# Patient Record
Sex: Male | Born: 1960 | Race: Black or African American | Hispanic: No | Smoking: Never smoker
Health system: Southern US, Community
[De-identification: ages and names within clinical notes are randomized; demographics above are authoritative.]

## PROBLEM LIST (undated history)

## (undated) DIAGNOSIS — I10 Essential (primary) hypertension: Secondary | ICD-10-CM

## (undated) DIAGNOSIS — J302 Other seasonal allergic rhinitis: Secondary | ICD-10-CM

## (undated) DIAGNOSIS — T7840XA Allergy, unspecified, initial encounter: Secondary | ICD-10-CM

## (undated) DIAGNOSIS — E785 Hyperlipidemia, unspecified: Secondary | ICD-10-CM

## (undated) DIAGNOSIS — N189 Chronic kidney disease, unspecified: Secondary | ICD-10-CM

## (undated) DIAGNOSIS — E119 Type 2 diabetes mellitus without complications: Secondary | ICD-10-CM

## (undated) HISTORY — DX: Chronic kidney disease, unspecified: N18.9

## (undated) HISTORY — DX: Allergy, unspecified, initial encounter: T78.40XA

## (undated) HISTORY — PX: COLONOSCOPY: SHX174

## (undated) HISTORY — DX: Type 2 diabetes mellitus without complications: E11.9

## (undated) HISTORY — PX: NO PAST SURGERIES: SHX2092

## (undated) HISTORY — DX: Essential (primary) hypertension: I10

## (undated) HISTORY — DX: Hyperlipidemia, unspecified: E78.5

---

## 2005-12-21 ENCOUNTER — Emergency Department (HOSPITAL_COMMUNITY): Admission: EM | Admit: 2005-12-21 | Discharge: 2005-12-21 | Payer: Self-pay | Admitting: Emergency Medicine

## 2005-12-25 ENCOUNTER — Emergency Department (HOSPITAL_COMMUNITY): Admission: EM | Admit: 2005-12-25 | Discharge: 2005-12-25 | Payer: Self-pay | Admitting: Emergency Medicine

## 2008-02-16 ENCOUNTER — Emergency Department (HOSPITAL_COMMUNITY): Admission: EM | Admit: 2008-02-16 | Discharge: 2008-02-16 | Payer: Self-pay | Admitting: Family Medicine

## 2009-01-19 ENCOUNTER — Emergency Department (HOSPITAL_COMMUNITY): Admission: EM | Admit: 2009-01-19 | Discharge: 2009-01-19 | Payer: Self-pay | Admitting: Family Medicine

## 2009-02-07 ENCOUNTER — Emergency Department (HOSPITAL_COMMUNITY): Admission: EM | Admit: 2009-02-07 | Discharge: 2009-02-07 | Payer: Self-pay | Admitting: Emergency Medicine

## 2009-05-15 ENCOUNTER — Emergency Department (HOSPITAL_COMMUNITY): Admission: EM | Admit: 2009-05-15 | Discharge: 2009-05-15 | Payer: Self-pay | Admitting: Emergency Medicine

## 2010-07-11 ENCOUNTER — Emergency Department (HOSPITAL_COMMUNITY): Admission: EM | Admit: 2010-07-11 | Discharge: 2010-07-11 | Payer: Self-pay | Admitting: Family Medicine

## 2011-02-14 LAB — GLUCOSE, CAPILLARY: Glucose-Capillary: 102 mg/dL — ABNORMAL HIGH (ref 70–99)

## 2011-07-30 LAB — CULTURE, ROUTINE-ABSCESS

## 2014-05-12 ENCOUNTER — Emergency Department (HOSPITAL_COMMUNITY)
Admission: EM | Admit: 2014-05-12 | Discharge: 2014-05-12 | Disposition: A | Discharge status: Home / Self Care | Payer: Self-pay | Attending: Emergency Medicine | Admitting: Emergency Medicine

## 2014-05-12 ENCOUNTER — Encounter (HOSPITAL_COMMUNITY): Payer: Self-pay | Admitting: Emergency Medicine

## 2014-05-12 DIAGNOSIS — Z23 Encounter for immunization: Secondary | ICD-10-CM | POA: Insufficient documentation

## 2014-05-12 DIAGNOSIS — X131XXA Other contact with steam and other hot vapors, initial encounter: Secondary | ICD-10-CM

## 2014-05-12 DIAGNOSIS — Y92009 Unspecified place in unspecified non-institutional (private) residence as the place of occurrence of the external cause: Secondary | ICD-10-CM | POA: Insufficient documentation

## 2014-05-12 DIAGNOSIS — T24291A Burn of second degree of multiple sites of right lower limb, except ankle and foot, initial encounter: Secondary | ICD-10-CM

## 2014-05-12 DIAGNOSIS — T24299A Burn of second degree of multiple sites of unspecified lower limb, except ankle and foot, initial encounter: Secondary | ICD-10-CM | POA: Insufficient documentation

## 2014-05-12 DIAGNOSIS — X12XXXA Contact with other hot fluids, initial encounter: Secondary | ICD-10-CM | POA: Insufficient documentation

## 2014-05-12 DIAGNOSIS — Y9389 Activity, other specified: Secondary | ICD-10-CM | POA: Insufficient documentation

## 2014-05-12 MED ORDER — TETANUS-DIPHTH-ACELL PERTUSSIS 5-2.5-18.5 LF-MCG/0.5 IM SUSP
0.5000 mL | Freq: Once | INTRAMUSCULAR | Status: AC
  Administered 2014-05-12: 0.5 mL via INTRAMUSCULAR
  Filled 2014-05-12: qty 0.5

## 2014-05-12 MED ORDER — CEPHALEXIN 500 MG PO CAPS: 500.0000 mg | ORAL_CAPSULE | Freq: Four times a day (QID) | ORAL | Status: DC

## 2014-05-12 MED ORDER — SILVER SULFADIAZINE 1 % EX CREA
TOPICAL_CREAM | Freq: Every day | CUTANEOUS | Status: DC
  Administered 2014-05-12: 09:00:00 via TOPICAL
  Filled 2014-05-12: qty 85

## 2014-05-12 MED ORDER — OXYCODONE-ACETAMINOPHEN 5-325 MG PO TABS: 2.0000 | ORAL_TABLET | ORAL | Status: DC | PRN
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
## 2014-05-12 NOTE — Discharge Instructions (Signed)
Apply cream to affected area twice daily.  Follow instruction below.  Take antibiotic as prescribed for the full duration.  Take pain medication as needed.    Burn Care Your skin is a natural barrier to infection. It is the largest organ of your body. Burns damage this natural protection. To help prevent infection, it is very important to follow your caregiver's instructions in the care of your burn. Burns are classified as:  First degree. There is only redness of the skin (erythema). No scarring is expected.  Second degree. There is blistering of the skin. Scarring may occur with deeper burns.  Third degree. All layers of the skin are injured, and scarring is expected. HOME CARE INSTRUCTIONS   Wash your hands well before changing your bandage.  Change your bandage as often as directed by your caregiver.  Remove the old bandage. If the bandage sticks, you may soak it off with cool, clean water.  Cleanse the burn thoroughly but gently with mild soap and water.  Pat the area dry with a clean, dry cloth.  Apply a thin layer of antibacterial cream to the burn.  Apply a clean bandage as instructed by your caregiver.  Keep the bandage as clean and dry as possible.  Elevate the affected area for the first 24 hours, then as instructed by your caregiver.  Only take over-the-counter or prescription medicines for pain, discomfort, or fever as directed by your caregiver. SEEK IMMEDIATE MEDICAL CARE IF:   You develop excessive pain.  You develop redness, tenderness, swelling, or red streaks near the burn.  The burned area develops yellowish-white fluid (pus) or a bad smell.  You have a fever. MAKE SURE YOU:   Understand these instructions.  Will watch your condition.  Will get help right away if you are not doing well or get worse. Document Released: 10/21/2005 Document Revised: 01/13/2012 Document Reviewed: 03/13/2011 [facility] Patient Information 2015 [place], [place]. This  information is not intended to replace advice given to you by your health care provider. Make sure you discuss any questions you have with your health care provider.

## 2014-05-12 NOTE — ED Provider Notes (Signed)
Medical screening examination/treatment/procedure(s) were performed by non-physician practitioner and as supervising physician I was immediately available for consultation/collaboration.   EKG Interpretation None        Blanchard Kelch, MD 05/12/14 2019

## 2014-05-12 NOTE — ED Notes (Signed)
PA at bedside to see pt.
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
## 2014-05-12 NOTE — ED Notes (Signed)
Brought pt back via wheelchair; pt undressed and in gown; Janett Billow, RN present in room

## 2014-05-13 NOTE — Discharge Planning (Signed)
[facility] Liaison was not able to see patient, [facility] orange card information and resource guide will be sent to the address provided.

## 2015-06-19 ENCOUNTER — Encounter (HOSPITAL_COMMUNITY): Payer: Self-pay | Admitting: *Deleted

## 2015-06-19 ENCOUNTER — Emergency Department (HOSPITAL_COMMUNITY)
Admission: EM | Admit: 2015-06-19 | Discharge: 2015-06-20 | Disposition: A | Discharge status: Home / Self Care | Payer: No Typology Code available for payment source | Attending: Emergency Medicine | Admitting: Emergency Medicine

## 2015-06-19 ENCOUNTER — Emergency Department (HOSPITAL_COMMUNITY): Payer: No Typology Code available for payment source

## 2015-06-19 ENCOUNTER — Emergency Department (INDEPENDENT_AMBULATORY_CARE_PROVIDER_SITE_OTHER)
Admission: EM | Admit: 2015-06-19 | Discharge: 2015-06-19 | Disposition: A | Discharge status: Other Acute Inpatient Hospital | Payer: No Typology Code available for payment source | Source: Home / Self Care | Attending: Family Medicine | Admitting: Family Medicine

## 2015-06-19 ENCOUNTER — Encounter (HOSPITAL_COMMUNITY): Payer: Self-pay | Admitting: Emergency Medicine

## 2015-06-19 DIAGNOSIS — N181 Chronic kidney disease, stage 1: Secondary | ICD-10-CM | POA: Diagnosis not present

## 2015-06-19 DIAGNOSIS — N289 Disorder of kidney and ureter, unspecified: Secondary | ICD-10-CM | POA: Diagnosis not present

## 2015-06-19 DIAGNOSIS — R42 Dizziness and giddiness: Secondary | ICD-10-CM | POA: Insufficient documentation

## 2015-06-19 DIAGNOSIS — Z79899 Other long term (current) drug therapy: Secondary | ICD-10-CM | POA: Insufficient documentation

## 2015-06-19 DIAGNOSIS — I129 Hypertensive chronic kidney disease with stage 1 through stage 4 chronic kidney disease, or unspecified chronic kidney disease: Secondary | ICD-10-CM | POA: Diagnosis not present

## 2015-06-19 DIAGNOSIS — I10 Essential (primary) hypertension: Secondary | ICD-10-CM | POA: Diagnosis not present

## 2015-06-19 HISTORY — DX: Other seasonal allergic rhinitis: J30.2

## 2015-06-19 LAB — COMPREHENSIVE METABOLIC PANEL
ALK PHOS: 56 U/L (ref 38–126)
ALT: 16 U/L — AB (ref 17–63)
AST: 21 U/L (ref 15–41)
Albumin: 4.2 g/dL (ref 3.5–5.0)
Anion gap: 10 (ref 5–15)
BILIRUBIN TOTAL: 0.7 mg/dL (ref 0.3–1.2)
BUN: 16 mg/dL (ref 6–20)
CALCIUM: 9.5 mg/dL (ref 8.9–10.3)
CHLORIDE: 103 mmol/L (ref 101–111)
CO2: 26 mmol/L (ref 22–32)
CREATININE: 1.71 mg/dL — AB (ref 0.61–1.24)
GFR, EST AFRICAN AMERICAN: 51 mL/min — AB (ref >60)
GFR, EST NON AFRICAN AMERICAN: 44 mL/min — AB (ref >60)
Glucose, Bld: 121 mg/dL — ABNORMAL HIGH (ref 65–99)
Potassium: 3.8 mmol/L (ref 3.5–5.1)
Sodium: 139 mmol/L (ref 135–145)
TOTAL PROTEIN: 7 g/dL (ref 6.5–8.1)

## 2015-06-19 LAB — POCT I-STAT, CHEM 8
BUN: 23 mg/dL — AB (ref 6–20)
CALCIUM ION: 1.2 mmol/L (ref 1.12–1.23)
CREATININE: 1.9 mg/dL — AB (ref 0.61–1.24)
Chloride: 104 mmol/L (ref 101–111)
GLUCOSE: 102 mg/dL — AB (ref 65–99)
HEMATOCRIT: 47 % (ref 39.0–52.0)
HEMOGLOBIN: 16 g/dL (ref 13.0–17.0)
Potassium: 4.1 mmol/L (ref 3.5–5.1)
Sodium: 140 mmol/L (ref 135–145)
TCO2: 23 mmol/L (ref 0–100)

## 2015-06-19 LAB — CBG MONITORING, ED: GLUCOSE-CAPILLARY: 104 mg/dL — AB (ref 65–99)

## 2015-06-19 LAB — I-STAT TROPONIN, ED: Troponin i, poc: 0 ng/mL (ref 0.00–0.08)

## 2015-06-19 LAB — BRAIN NATRIURETIC PEPTIDE: B NATRIURETIC PEPTIDE 5: 35.1 pg/mL (ref 0.0–100.0)

## 2015-06-19 MED ORDER — LISINOPRIL 10 MG PO TABS: 10.0000 mg | ORAL_TABLET | Freq: Every day | ORAL | Status: DC

## 2015-06-19 MED ORDER — HYDROCHLOROTHIAZIDE 25 MG PO TABS: 25.0000 mg | ORAL_TABLET | Freq: Every day | ORAL | Status: DC

## 2015-06-19 MED ORDER — HYDRALAZINE HCL 20 MG/ML IJ SOLN
10.0000 mg | Freq: Once | INTRAMUSCULAR | Status: AC
  Administered 2015-06-19: 10 mg via INTRAVENOUS
  Filled 2015-06-19: qty 1

## 2015-06-19 NOTE — ED Provider Notes (Signed)
CSN: [id number]     Arrival date & time 06/19/15  1813 History   First MD Initiated Contact with Patient 06/19/15 2005     Chief Complaint  Patient presents with  . Hypertension     (Consider location/radiation/quality/duration/timing/severity/associated sxs/prior Treatment) Patient is a 54 y.o. male presenting with hypertension. The history is provided by the patient. No language interpreter was used.  Hypertension This is a new problem. The current episode started today. Pertinent negatives include no chest pain, chills, fever, nausea or vomiting. Associated symptoms comments: Patient without significant medical history presents following Urgent Care evaluation where he was found to be hypertensive with elevated creatinine.  He denies known history of elevated blood pressure. He became lightheaded today and his blood pressure was checked on two different machines and found to be high. No chest pain, SOB, extremity edema, nausea or diaphoresis. He has an unknown family history as his parents died at a young age. He is currently asymptomatic.Marland Kitchen    Past Medical History  Diagnosis Date  . Seasonal allergies    History reviewed. No pertinent past surgical history. History reviewed. No pertinent family history. Social History  Substance Use Topics  . Smoking status: Never Smoker   . Smokeless tobacco: None  . Alcohol Use: 0.6 oz/week    1 Cans of beer per week    Review of Systems  Constitutional: Negative for fever and chills.  HENT: Negative.   Eyes: Negative for visual disturbance.  Respiratory: Negative.  Negative for shortness of breath.   Cardiovascular: Negative.  Negative for chest pain.  Gastrointestinal: Negative.  Negative for nausea and vomiting.  Genitourinary: Negative.   Musculoskeletal: Negative.   Skin: Negative.   Neurological: Positive for light-headedness. Negative for syncope.      Allergies  Review of patient's allergies indicates no known  allergies.  Home Medications   Prior to Admission medications   Medication Sig Start Date End Date Taking? Authorizing Provider  Multiple Vitamin (MULTIVITAMIN WITH MINERALS) TABS tablet Take 1 tablet by mouth daily.   Yes Historical Provider, MD   BP 166/106 mmHg  Pulse 55  Temp(Src) 98 F (36.7 C) (Oral)  Resp 16  Wt 182 lb 3.2 oz (82.645 kg)  SpO2 100% Physical Exam  Constitutional: He is oriented to person, place, and time. He appears well-developed and well-nourished. No distress.  HENT:  Head: Normocephalic.  Eyes: Conjunctivae are normal.  Neck: Normal range of motion. Neck supple.  Cardiovascular: Normal rate and regular rhythm.   No carotid bruit.  Pulmonary/Chest: Effort normal and breath sounds normal.  Abdominal: Soft. Bowel sounds are normal. There is no tenderness. There is no rebound and no guarding.  Musculoskeletal: Normal range of motion. He exhibits no edema.  Neurological: He is alert and oriented to person, place, and time. Coordination normal.  Skin: Skin is warm and dry. No rash noted.  Psychiatric: He has a normal mood and affect.    ED Course  Procedures (including critical care time) Labs Review Labs Reviewed  COMPREHENSIVE METABOLIC PANEL  BRAIN NATRIURETIC PEPTIDE  I-STAT [place], ED  CBG MONITORING, ED   Results for orders placed or performed during the hospital encounter of 06/19/15  Comprehensive metabolic panel  Result Value Ref Range   Sodium 139 135 - 145 mmol/L   Potassium 3.8 3.5 - 5.1 mmol/L   Chloride 103 101 - 111 mmol/L   CO2 26 22 - 32 mmol/L   Glucose, Bld 121 (H) 65 - 99 mg/dL  BUN 16 6 - 20 mg/dL   Creatinine, Ser 1.71 (H) 0.61 - 1.24 mg/dL   Calcium 9.5 8.9 - 10.3 mg/dL   Total Protein 7.0 6.5 - 8.1 g/dL   Albumin 4.2 3.5 - 5.0 g/dL   AST 21 15 - 41 U/L   ALT 16 (L) 17 - 63 U/L   Alkaline Phosphatase 56 38 - 126 U/L   Total Bilirubin 0.7 0.3 - 1.2 mg/dL   GFR calc non Af Amer 44 (L) >60 mL/min   GFR calc Af Amer  51 (L) >60 mL/min   Anion gap 10 5 - 15  Brain natriuretic peptide  Result Value Ref Range   B Natriuretic Peptide 35.1 0.0 - 100.0 pg/mL  I-stat troponin, ED  Result Value Ref Range   Troponin i, poc 0.00 0.00 - 0.08 ng/mL   Comment 3          CBG monitoring, ED  Result Value Ref Range   Glucose-Capillary 104 (H) 65 - 99 mg/dL   Dg Chest 2 View  06/19/2015   CLINICAL DATA:  Dizziness, hypertension  EXAM: CHEST  2 VIEW  COMPARISON:  None.  FINDINGS: Lungs are clear.  No pleural effusion or pneumothorax.  The heart is normal in size.  Visualized osseous structures are within normal limits.  IMPRESSION: No evidence of acute cardiopulmonary disease.   Electronically Signed   By: Julian Hy M.D.   On: 06/19/2015 21:40   US Renal  06/19/2015   CLINICAL DATA:  Hypertension and elevated creatinine.  EXAM: RENAL / URINARY TRACT ULTRASOUND COMPLETE  COMPARISON:  None.  FINDINGS: Right Kidney:  Length: 10 cm. Echogenicity within normal limits. No mass or hydronephrosis visualized.  Left Kidney:  Length: 10 cm. Echogenicity within normal limits. No mass or hydronephrosis visualized.  Bladder:  Appears normal for degree of bladder distention.  IMPRESSION: Normal renal ultrasound.   Electronically Signed   By: Monte Fantasia M.D.   On: 06/19/2015 23:00    Imaging Review No results found. I, Raneen Jaffer A, personally reviewed and evaluated these images and lab results as part of my medical decision-making.   EKG Interpretation None      MDM   Final diagnoses:  None    1. Hypertension 2. Renal insufficiency  The patient's labs from visit to Urgent Care reviewed in the chart. Cr 1.9. Will repeat labs, treat blood pressure, obtain renal ultrasound and reassess to determine admission vs discharge home. Of note, the patient does not want to be admitted if possible to discharge home.  Hydralazine with little improvement. He continues to be asymptomatic. Renal US normal. EKG with no  LVH. He can be discharged home with Lisinopril and HCTZ. Strongly encouraged establishing with PCP for further management of HTN and renal insufficiency. Family association with Dr. Kevan Ny who they will contact tomorrow. Will provide resource list as well.      Charlann Lange, PA-C 06/19/15 2346  Charlesetta Shanks, MD 06/21/15 [phone]

## 2015-06-19 NOTE — Discharge Instructions (Signed)

## 2015-06-19 NOTE — ED Provider Notes (Addendum)
CSN: [id number]     Arrival date & time 06/19/15  1418 History   First MD Initiated Contact with Patient 06/19/15 1615     Chief Complaint  Patient presents with  . Dizziness   (Consider location/radiation/quality/duration/timing/severity/associated sxs/prior Treatment) Patient is a 54 y.o. male presenting with dizziness. The history is provided by the patient.  Dizziness Quality:  Lightheadedness Severity:  Mild Onset quality:  Sudden Duration:  1 day Progression:  Unchanged Chronicity:  New Relieved by:  None tried Worsened by:  Nothing Ineffective treatments:  None tried Associated symptoms: no chest pain, no headaches, no palpitations, no shortness of breath and no weakness     Past Medical History  Diagnosis Date  . Seasonal allergies    History reviewed. No pertinent past surgical history. History reviewed. No pertinent family history. Social History  Substance Use Topics  . Smoking status: Never Smoker   . Smokeless tobacco: None  . Alcohol Use: 0.6 oz/week    1 Cans of beer per week    Review of Systems  Unable to perform ROS Constitutional: Negative.   HENT: Negative.   Respiratory: Negative for shortness of breath.   Cardiovascular: Negative for chest pain, palpitations and leg swelling.  Neurological: Positive for dizziness and light-headedness. Negative for weakness and headaches.    Allergies  Review of patient's allergies indicates no known allergies.  Home Medications   Prior to Admission medications   Medication Sig Start Date End Date Taking? Authorizing Provider  hydrochlorothiazide (HYDRODIURIL) 25 MG tablet Take 1 tablet (25 mg total) by mouth daily. 06/19/15   Charlann Lange, PA-C  lisinopril (PRINIVIL,ZESTRIL) 10 MG tablet Take 1 tablet (10 mg total) by mouth daily. 06/19/15   Charlann Lange, PA-C  Multiple Vitamin (MULTIVITAMIN WITH MINERALS) TABS tablet Take 1 tablet by mouth daily.    Historical Provider, MD   BP 204/111 mmHg  Pulse 59   Temp(Src) 99.2 F (37.3 C) (Oral)  Resp 18  SpO2 100% Physical Exam  Constitutional: He is oriented to person, place, and time. He appears well-developed and well-nourished. No distress.  HENT:  Right Ear: External ear normal.  Left Ear: External ear normal.  Mouth/Throat: Oropharynx is clear and moist.  Eyes: Conjunctivae and EOM are normal. Pupils are equal, round, and reactive to light.  Neck: Normal range of motion. Neck supple.  Cardiovascular: Regular rhythm and normal heart sounds.   Pulmonary/Chest: Effort normal and breath sounds normal.  Abdominal: Soft. Bowel sounds are normal.  Neurological: He is alert and oriented to person, place, and time.  Skin: Skin is warm and dry.  Nursing note and vitals reviewed.   ED Course  Procedures (including critical care time) Labs Review Labs Reviewed  POCT I-STAT, CHEM 8 - Abnormal; Notable for the following:    BUN 23 (*)    Creatinine, Ser 1.90 (*)    Glucose, Bld 102 (*)    All other components within normal limits    Imaging Review Dg Chest 2 View  06/19/2015   CLINICAL DATA:  Dizziness, hypertension  EXAM: CHEST  2 VIEW  COMPARISON:  None.  FINDINGS: Lungs are clear.  No pleural effusion or pneumothorax.  The heart is normal in size.  Visualized osseous structures are within normal limits.  IMPRESSION: No evidence of acute cardiopulmonary disease.   Electronically Signed   By: Julian Hy M.D.   On: 06/19/2015 21:40   US Renal  06/19/2015   CLINICAL DATA:  Hypertension and elevated creatinine.  EXAM:  RENAL / URINARY TRACT ULTRASOUND COMPLETE  COMPARISON:  None.  FINDINGS: Right Kidney:  Length: 10 cm. Echogenicity within normal limits. No mass or hydronephrosis visualized.  Left Kidney:  Length: 10 cm. Echogenicity within normal limits. No mass or hydronephrosis visualized.  Bladder:  Appears normal for degree of bladder distention.  IMPRESSION: Normal renal ultrasound.   Electronically Signed   By: Monte Fantasia M.D.    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    Billy Fischer, MD 06/19/15 1719  Billy Fischer, MD 06/21/15 [phone]

## 2015-06-19 NOTE — ED Notes (Signed)
Pt   Reports      Symptoms  Of  dizzyness       And  Headache       For     About  6  Months  -  Pt  denys  Being a  Diabetic  Or  Hypertensive         denys taking  Any meds

## 2015-06-19 NOTE — ED Notes (Signed)
Pt went to urgent care and they diagnosed him with malignant hypertension and they sent him to ED. He states he was at work this morning and he started feeling light headed and dizzy. He said the Urgent Care told him he needed to be treated in the ER. His BP was 204/111 in Urgent Care.

## 2020-01-17 ENCOUNTER — Other Ambulatory Visit: Payer: Self-pay

## 2020-01-17 ENCOUNTER — Emergency Department (HOSPITAL_COMMUNITY)
Admission: EM | Admit: 2020-01-17 | Discharge: 2020-01-17 | Disposition: A | Discharge status: Home / Self Care | Payer: 59 | Attending: Emergency Medicine | Admitting: Emergency Medicine

## 2020-01-17 ENCOUNTER — Encounter (HOSPITAL_COMMUNITY): Payer: Self-pay

## 2020-01-17 DIAGNOSIS — R42 Dizziness and giddiness: Secondary | ICD-10-CM | POA: Diagnosis present

## 2020-01-17 DIAGNOSIS — I10 Essential (primary) hypertension: Secondary | ICD-10-CM | POA: Insufficient documentation

## 2020-01-17 LAB — CBC
HCT: 44.1 % (ref 39.0–52.0)
Hemoglobin: 13.9 g/dL (ref 13.0–17.0)
MCH: 27.7 pg (ref 26.0–34.0)
MCHC: 31.5 g/dL (ref 30.0–36.0)
MCV: 87.8 fL (ref 80.0–100.0)
Platelets: 178 10*3/uL (ref 150–400)
RBC: 5.02 MIL/uL (ref 4.22–5.81)
RDW: 14.3 % (ref 11.5–15.5)
WBC: 6.4 10*3/uL (ref 4.0–10.5)
nRBC: 0 % (ref 0.0–0.2)

## 2020-01-17 LAB — BASIC METABOLIC PANEL
Anion gap: 9 (ref 5–15)
BUN: 20 mg/dL (ref 6–20)
CO2: 26 mmol/L (ref 22–32)
Calcium: 8.9 mg/dL (ref 8.9–10.3)
Chloride: 102 mmol/L (ref 98–111)
Creatinine, Ser: 1.71 mg/dL — ABNORMAL HIGH (ref 0.61–1.24)
GFR calc Af Amer: 50 mL/min — ABNORMAL LOW (ref >60)
GFR calc non Af Amer: 43 mL/min — ABNORMAL LOW (ref >60)
Glucose, Bld: 148 mg/dL — ABNORMAL HIGH (ref 70–99)
Potassium: 3.7 mmol/L (ref 3.5–5.1)
Sodium: 137 mmol/L (ref 135–145)

## 2020-01-17 MED ORDER — HYDROCHLOROTHIAZIDE 25 MG PO TABS: 25.0000 mg | ORAL_TABLET | Freq: Every day | ORAL | 0 refills | Status: DC

## 2020-01-17 MED ORDER — HYDROCHLOROTHIAZIDE 25 MG PO TABS
25.0000 mg | ORAL_TABLET | Freq: Every day | ORAL | Status: DC
  Administered 2020-01-17: 14:00:00 25 mg via ORAL
  Filled 2020-01-17: qty 1

## 2020-01-17 NOTE — Discharge Instructions (Signed)
Please contact your insurance company and figure out which providers in the area accept your insurance.  It is imperative they get established with a primary care provider soon as possible for ongoing evaluation and management of your high blood pressure.  Please take your medications, as prescribed.  Return to the ED or seek immediate medical attention should you experience any new or worsening symptoms.

## 2020-01-17 NOTE — ED Triage Notes (Signed)
Pt states while at work he had a sudden episode of dizziness and blurred vision, pts coworker checked his BP which was 191/113 and 189/109. Pt does not take any medications of HTN. Pt denies any complaints at this time. Pt ambulatory with steady gait.

## 2020-01-17 NOTE — ED Provider Notes (Signed)
[facility] Provider Note   CSN: [id number] Arrival date & time: 01/17/20  1201     History Chief Complaint  Patient presents with  . Dizziness  . Hypertension    Timothy Warren is a 59 y.o. male with PMH significant for HTN presents the ED with an episode of dizziness, blurred vision, and lightheadedness while at work.  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  He had just obtained a screwdriver from a coworker when he suddenly felt like his vision was changing, that he felt like he was floating, and that he may pass out.  He denied any associated chest pain, back pain, or shortness of breath with this episode of dizziness.  It spontaneously resolved, but he was prompted to come to the ED for evaluation.  On my examination, he is denying any and all complaints.  He denies feeling any blurred vision, lightheadedness, headache, room spinning dizziness, chest pain, back pain, shortness of breath, or other symptoms.  I reviewed patient's medical record and he had been evaluated for similar complaints in 2016 and discharged home with HCTZ and lisinopril with instructions to get established with a primary care provider.  He reports that he tried, but gave up shortly thereafter.  He denies any amphetamine, tobacco, coffee, or energy drinks.  He does endorse occasional alcohol and marijuana use.  HPI     Past Medical History:  Diagnosis Date  . Seasonal allergies     There are no problems to display for this patient.   History reviewed. No pertinent surgical history.     No family history on file.  Social History   Tobacco Use  . Smoking status: Never Smoker  Substance Use Topics  . Alcohol use: Yes    Alcohol/week: 1.0 standard drinks    Types: 1 Cans of beer per week  . Drug use: Yes    Types: Marijuana    Home Medications Prior to Admission medications   Medication Sig Start Date End  Date Taking? Authorizing Provider  Boswellia-Glucosamine-Vit D (OSTEO BI-FLEX ONE PER DAY PO) Take 1 tablet by mouth daily.   Yes [provider]  Multiple Vitamin (MULTIVITAMIN WITH MINERALS) TABS tablet Take 1 tablet by mouth daily.   Yes [provider]  hydrochlorothiazide (HYDRODIURIL) 25 MG tablet Take 1 tablet (25 mg total) by mouth daily. 01/17/20 02/16/20  Corena Herter, PA-C    Allergies    Patient has no known allergies.  Review of Systems   Review of Systems  All other systems reviewed and are negative.   Physical Exam Updated Vital Signs BP (!) 181/101   Pulse (!) 55   Temp (!) 97.4 F (36.3 C) (Oral)   Resp 18   Ht 5\' 9"  (1.753 m)   Wt 87.2 kg   SpO2 99%   BMI 28.40 kg/m   Physical Exam Vitals and nursing note reviewed. Exam conducted with a chaperone present.  Constitutional:      General: He is not in acute distress.    Appearance: Normal appearance. He is not ill-appearing.  HENT:     Head: Normocephalic and atraumatic.  Eyes:     General: No scleral icterus.    Extraocular Movements: Extraocular movements intact.     Conjunctiva/sclera: Conjunctivae normal.     Pupils: Pupils are equal, round, and reactive to light.  Cardiovascular:     Rate and Rhythm: Normal rate and  regular rhythm.     Pulses: Normal pulses.     Heart sounds: Normal heart sounds.  Pulmonary:     Effort: Pulmonary effort is normal. No respiratory distress.     Breath sounds: Normal breath sounds. No wheezing or rales.  Musculoskeletal:     Cervical back: Normal range of motion and neck supple. No rigidity.  Skin:    General: Skin is dry.     Capillary Refill: Capillary refill takes less than 2 seconds.  Neurological:     Mental Status: He is alert.     GCS: GCS eye subscore is 4. GCS verbal subscore is 5. GCS motor subscore is 6.     Comments: CN II through XII grossly intact.  ROM and strength intact throughout.  Negative Romberg and cerebellar exams.   PERRL and EOM intact.  Psychiatric:        Mood and Affect: Mood normal.        Behavior: Behavior normal.        Thought Content: Thought content normal.      ED Results / Procedures / Treatments   Labs (all labs ordered are listed, but only abnormal results are displayed) Labs Reviewed  BASIC METABOLIC PANEL - Abnormal; Notable for the following components:      Result Value   Glucose, Bld 148 (*)    Creatinine, Ser 1.71 (*)    GFR calc non Af Amer 43 (*)    GFR calc Af Amer 50 (*)    All other components within normal limits  CBC    EKG EKG Interpretation  Date/Time:  Monday January 17 2020 12:27:01 EDT Ventricular Rate:  60 PR Interval:  142 QRS Duration: 100 QT Interval:  428 QTC Calculation: 428 R Axis:   -35 Text Interpretation: Normal sinus rhythm Left axis deviation Incomplete right bundle branch block Nonspecific ST and T wave abnormality Abnormal ECG no significant change since 2016 Confirmed by Sherwood Gambler [phone]) on 01/17/2020 12:41:27 PM   Radiology No results found.  Procedures Procedures (including critical care time)  Medications Ordered in ED Medications  hydrochlorothiazide (HYDRODIURIL) tablet 25 mg (25 mg Oral Given 01/17/20 1414)    ED Course  I have reviewed the triage vital signs and the nursing notes.  Pertinent labs & imaging results that were available during my care of the patient were reviewed by me and considered in my medical decision making (see chart for details).    MDM Rules/Calculators/A&P                      EKG is reviewed and demonstrates no significant deviation from prior tracing.  His BMP demonstrates a mildly impaired renal function, consistent with his last ED presentation in 2016 for symptomatic hypertension.  Provided patient with HCTZ while here in the ED which brought his BP down from [id number] systolic to a more reasonable figure.  He is feeling entirely improved and prepared for discharge.  Will prescribe short  course of HCTZ and have patient get established with a primary care provider soon as possible for ongoing evaluation management of his high blood pressure.  Strict ED return precautions discussed with the patient.  All of the evaluation and work-up results were discussed with the patient and any family at bedside. They were provided opportunity to ask any additional questions and have none at this time. They have expressed understanding of verbal discharge instructions as well as return precautions and are agreeable to the plan.  Final Clinical Impression(s) / ED Diagnoses Final diagnoses:  Essential hypertension    Rx / DC Orders ED Discharge Orders         Ordered    hydrochlorothiazide (HYDRODIURIL) 25 MG tablet  Daily     01/17/20 1442           Corena Herter, PA-C 01/17/20 1442    Sherwood Gambler, MD 01/18/20 [phone]

## 2020-02-16 ENCOUNTER — Ambulatory Visit: Payer: 59

## 2020-02-17 ENCOUNTER — Ambulatory Visit: Payer: 59 | Attending: Family Medicine | Admitting: Physician Assistant

## 2020-02-17 ENCOUNTER — Other Ambulatory Visit: Payer: Self-pay

## 2020-02-17 DIAGNOSIS — Z09 Encounter for follow-up examination after completed treatment for conditions other than malignant neoplasm: Secondary | ICD-10-CM | POA: Diagnosis not present

## 2020-02-17 DIAGNOSIS — I10 Essential (primary) hypertension: Secondary | ICD-10-CM | POA: Diagnosis not present

## 2020-02-17 DIAGNOSIS — R739 Hyperglycemia, unspecified: Secondary | ICD-10-CM | POA: Diagnosis not present

## 2020-02-17 MED ORDER — HYDROCHLOROTHIAZIDE 25 MG PO TABS: 25.0000 mg | ORAL_TABLET | Freq: Every day | ORAL | 1 refills | Status: DC

## 2020-02-17 NOTE — Progress Notes (Signed)
Patient ID: Timothy Warren, male   DOB: 02/18/1961, 59 y.o.   MRN: [id number]   Virtual Visit via Telephone Note  I connected with [name] on 02/17/20 at  2:30 PM EDT by telephone and verified that I am speaking with the correct person using two identifiers.   I discussed the limitations, risks, security and privacy concerns of performing an evaluation and management service by telephone and the availability of in person appointments. I also discussed with the patient that there may be a patient responsible charge related to this service. The patient expressed understanding and agreed to proceed.   PATIENT visit by telephone virtually in the context of Covid-19 pandemic. Patient location:  home My Location:  [place] office Persons on the call:  Me and the patient    History of Present Illness: After being seen in the Ed for dizziness and htn.  He was restarted on HCTZ.  He feels that is bc he has been eating a lot of oodles of noodles and drinking 2 ensure daily as supplements.  He says he has been taking the HCTZ and having his BP checked at work and they have been telling him that it's "good."  However, he does not have any numbers.  Dizziness has resolved.  He feels good.  No CP/HA.     Observations/Objective:  NAD.  A&Ox3   Assessment and Plan: 1. Hypertension, unspecified type Check BP OOO 2-3 times weekly and record and bring to next visit.   - hydrochlorothiazide (HYDRODIURIL) 25 MG tablet; Take 1 tablet (25 mg total) by mouth daily.  Dispense: 30 tablet; Refill: 1  2. Hyperglycemia I have had a lengthy discussion and provided education about insulin resistance and the intake of too much sugar/refined carbohydrates.  I have advised the patient to work at a goal of eliminating sugary drinks, candy, desserts, sweets, refined sugars, processed foods, and white carbohydrates.  The patient expresses understanding.  Will check A1C at next visit   3. Encounter for examination  following treatment at hospital    Follow Up Instructions: Assign PCP and BP/A1C check    I discussed the assessment and treatment plan with the patient. The patient was provided an opportunity to ask questions and all were answered. The patient agreed with the plan and demonstrated an understanding of the instructions.   The patient was advised to call back or seek an in-person evaluation if the symptoms worsen or if the condition fails to improve as anticipated.  I provided 14 minutes of non-face-to-face time during this encounter.   Freeman Caldron, PA-C

## 2020-02-18 ENCOUNTER — Other Ambulatory Visit: Payer: Self-pay

## 2020-02-18 DIAGNOSIS — I10 Essential (primary) hypertension: Secondary | ICD-10-CM

## 2020-02-18 MED ORDER — HYDROCHLOROTHIAZIDE 25 MG PO TABS: 25.0000 mg | ORAL_TABLET | Freq: Every day | ORAL | 1 refills | Status: DC

## 2020-03-22 ENCOUNTER — Ambulatory Visit: Payer: 59 | Attending: Family Medicine | Admitting: Family Medicine

## 2020-03-22 ENCOUNTER — Other Ambulatory Visit: Payer: Self-pay

## 2020-03-22 ENCOUNTER — Encounter: Payer: Self-pay | Admitting: Family Medicine

## 2020-03-22 VITALS — BP 151/93 | HR 83 | Temp 98.6°F | Ht 69.0 in | Wt 182.2 lb

## 2020-03-22 DIAGNOSIS — R7309 Other abnormal glucose: Secondary | ICD-10-CM

## 2020-03-22 DIAGNOSIS — E119 Type 2 diabetes mellitus without complications: Secondary | ICD-10-CM

## 2020-03-22 DIAGNOSIS — I10 Essential (primary) hypertension: Secondary | ICD-10-CM

## 2020-03-22 DIAGNOSIS — R739 Hyperglycemia, unspecified: Secondary | ICD-10-CM

## 2020-03-22 DIAGNOSIS — R350 Frequency of micturition: Secondary | ICD-10-CM | POA: Diagnosis not present

## 2020-03-22 DIAGNOSIS — N189 Chronic kidney disease, unspecified: Secondary | ICD-10-CM

## 2020-03-22 LAB — POCT URINALYSIS DIP (CLINITEK)
Bilirubin, UA: NEGATIVE
Glucose, UA: NEGATIVE mg/dL
Ketones, POC UA: NEGATIVE mg/dL
Leukocytes, UA: NEGATIVE
Nitrite, UA: NEGATIVE
POC PROTEIN,UA: NEGATIVE
Spec Grav, UA: >=1.03 — AB
Urobilinogen, UA: 0.2 U/dL
pH, UA: 5.5

## 2020-03-22 LAB — POCT GLYCOSYLATED HEMOGLOBIN (HGB A1C): Hemoglobin A1C: 7.6 % — AB (ref 4.0–5.6)

## 2020-03-22 MED ORDER — AMLODIPINE BESYLATE 5 MG PO TABS: 5.0000 mg | ORAL_TABLET | Freq: Every day | ORAL | 3 refills | Status: DC

## 2020-03-22 MED ORDER — HYDROCHLOROTHIAZIDE 25 MG PO TABS: 25.0000 mg | ORAL_TABLET | Freq: Every day | ORAL | 1 refills | Status: DC

## 2020-03-22 NOTE — Progress Notes (Signed)
Est care

## 2020-03-22 NOTE — Patient Instructions (Addendum)
Your hemoglobin A1c was 7.6 and this value is consistent with a diagnosis of Type 2 Diabetes. Your recent blood work was also consistent with chronic kidney disease.  I would like for you to return in the next 1 to 2 weeks to meet with the clinical pharmacist regarding monitoring of your blood sugars as well as possible medication to help with your blood sugars and for recheck of your blood pressure to make sure that it is controlled on your current medications.  New medication, amlodipine 5 mg, to take once daily has been prescribed and you will continue your current hydrochlorothiazide/HCTZ.    Chronic Kidney Disease, Adult Chronic kidney disease (CKD) happens when the kidneys are damaged over a long period of time. The kidneys are two organs that help with:  Getting rid of waste and extra fluid from the blood.  Making hormones that maintain the amount of fluid in your tissues and blood vessels.  Making sure that the body has the right amount of fluids and chemicals. Most of the time, CKD does not go away, but it can usually be controlled. Steps must be taken to slow down the kidney damage or to stop it from getting worse. If this is not done, the kidneys may stop working. Follow these instructions at home: Medicines  Take over-the-counter and prescription medicines only as told by your doctor. You may need to change the amount of medicines you take.  Do not take any new medicines unless your doctor says it is okay. Many medicines can make your kidney damage worse.  Do not take any vitamin and supplements unless your doctor says it is okay. Many vitamins and supplements can make your kidney damage worse. General instructions  Follow a diet as told by your doctor. You may need to stay away from: ? Alcohol. ? Salty foods. ? Foods that are high in:  Potassium.  Calcium.  Protein.  Do not use any products that contain nicotine or tobacco, such as cigarettes and e-cigarettes. If you  need help quitting, ask your doctor.  Keep track of your blood pressure at home. Tell your doctor about any changes.  If you have diabetes, keep track of your blood sugar as told by your doctor.  Try to stay at a healthy weight. If you need help, ask your doctor.  Exercise at least 30 minutes a day, 5 days a week.  Stay up-to-date with your shots (immunizations) as told by your doctor.  Keep all follow-up visits as told by your doctor. This is important. Contact a doctor if:  Your symptoms get worse.  You have new symptoms. Get help right away if:  You have symptoms of end-stage kidney disease. These may include: ? Headaches. ? Numbness in your hands or feet. ? Easy bruising. ? Having hiccups often. ? Chest pain. ? Shortness of breath. ? Stopping of menstrual periods in women.  You have a fever.  You have very little pee (urine).  You have pain or bleeding when you pee. Summary  Chronic kidney disease (CKD) happens when the kidneys are damaged over a long period of time.  Most of the time, this condition does not go away, but it can usually be controlled. Steps must be taken to slow down the kidney damage or to stop it from getting worse.  Treatment may include a combination of medicines and lifestyle changes. This information is not intended to replace advice given to you by your health care provider. Make sure you discuss  any questions you have with your health care provider. Document Revised: 10/03/2017 Document Reviewed: 11/25/2016 Elsevier Patient Education  2020 [place].  Diabetes Basics  Diabetes (diabetes mellitus) is a long-term (chronic) disease. It occurs when the body does not properly use sugar (glucose) that is released from food after you eat. Diabetes may be caused by one or both of these problems:  Your pancreas does not make enough of a hormone called insulin.  Your body does not react in a normal way to insulin that it makes. Insulin lets  sugars (glucose) go into cells in your body. This gives you energy. If you have diabetes, sugars cannot get into cells. This causes high blood sugar (hyperglycemia). Follow these instructions at home: How is diabetes treated? You may need to take insulin or other diabetes medicines daily to keep your blood sugar in balance. Take your diabetes medicines every day as told by your doctor. List your diabetes medicines here: Diabetes medicines  Name of medicine: ______________________________ ? Amount (dose): _______________ Time (a.m./p.m.): _______________ Notes: ___________________________________  Name of medicine: ______________________________ ? Amount (dose): _______________ Time (a.m./p.m.): _______________ Notes: ___________________________________  Name of medicine: ______________________________ ? Amount (dose): _______________ Time (a.m./p.m.): _______________ Notes: ___________________________________ If you use insulin, you will learn how to give yourself insulin by injection. You may need to adjust the amount based on the food that you eat. List the types of insulin you use here: Insulin  Insulin type: ______________________________ ? Amount (dose): _______________ Time (a.m./p.m.): _______________ Notes: ___________________________________  Insulin type: ______________________________ ? Amount (dose): _______________ Time (a.m./p.m.): _______________ Notes: ___________________________________  Insulin type: ______________________________ ? Amount (dose): _______________ Time (a.m./p.m.): _______________ Notes: ___________________________________  Insulin type: ______________________________ ? Amount (dose): _______________ Time (a.m./p.m.): _______________ Notes: ___________________________________  Insulin type: ______________________________ ? Amount (dose): _______________ Time (a.m./p.m.): _______________ Notes: ___________________________________ How do I manage my  blood sugar?  Check your blood sugar levels using a blood glucose monitor as directed by your doctor. Your doctor will set treatment goals for you. Generally, you should have these blood sugar levels:  Before meals (preprandial): 80-130 mg/dL (4.4-7.2 mmol/L).  After meals (postprandial): below 180 mg/dL (10 mmol/L).  A1c level: less than 7%. Write down the times that you will check your blood sugar levels: Blood sugar checks  Time: _______________ Notes: ___________________________________  Time: _______________ Notes: ___________________________________  Time: _______________ Notes: ___________________________________  Time: _______________ Notes: ___________________________________  Time: _______________ Notes: ___________________________________  Time: _______________ Notes: ___________________________________  What do I need to know about low blood sugar? Low blood sugar is called hypoglycemia. This is when blood sugar is at or below 70 mg/dL (3.9 mmol/L). Symptoms may include:  Feeling: ? Hungry. ? Worried or nervous (anxious). ? Sweaty and clammy. ? Confused. ? Dizzy. ? Sleepy. ? Sick to your stomach (nauseous).  Having: ? A fast heartbeat. ? A headache. ? A change in your vision. ? Tingling or no feeling (numbness) around the mouth, lips, or tongue. ? Jerky movements that you cannot control (seizure).  Having trouble with: ? Moving (coordination). ? Sleeping. ? Passing out (fainting). ? Getting upset easily (irritability). Treating low blood sugar To treat low blood sugar, eat or drink something sugary right away. If you can think clearly and swallow safely, follow the 15:15 rule:  Take 15 grams of a fast-acting carb (carbohydrate). Talk with your doctor about how much you should take.  Some fast-acting carbs are: ? Sugar tablets (glucose pills). Take 3-4 glucose pills. ? 6-8 pieces of hard candy. ? 4-6 oz (120-150  mL) of fruit juice. ? 4-6 oz  (120-150 mL) of regular (not diet) soda. ? 1 Tbsp (15 mL) honey or sugar.  Check your blood sugar 15 minutes after you take the carb.  If your blood sugar is still at or below 70 mg/dL (3.9 mmol/L), take 15 grams of a carb again.  If your blood sugar does not go above 70 mg/dL (3.9 mmol/L) after 3 tries, get help right away.  After your blood sugar goes back to normal, eat a meal or a snack within 1 hour. Treating very low blood sugar If your blood sugar is at or below 54 mg/dL (3 mmol/L), you have very low blood sugar (severe hypoglycemia). This is an emergency. Do not wait to see if the symptoms will go away. Get medical help right away. Call your local emergency services (911 in the [place] Do not drive yourself to the hospital. Questions to ask your health care provider  Do I need to meet with a diabetes educator?  What equipment will I need to care for myself at home?  What diabetes medicines do I need? When should I take them?  How often do I need to check my blood sugar?  What number can I call if I have questions?  When is my next doctor's visit?  Where can I find a support group for people with diabetes? Where to find more information  American Diabetes Association: www.diabetes.org  American Association of Diabetes Educators: www.diabeteseducator.org/patient-resources Contact a doctor if:  Your blood sugar is at or above 240 mg/dL (13.3 mmol/L) for 2 days in a row.  You have been sick or have had a fever for 2 days or more, and you are not getting better.  You have any of these problems for more than 6 hours: ? You cannot eat or drink. ? You feel sick to your stomach (nauseous). ? You throw up (vomit). ? You have watery poop (diarrhea). Get help right away if:  Your blood sugar is lower than 54 mg/dL (3 mmol/L).  You get confused.  You have trouble: ? Thinking clearly. ? Breathing. Summary  Diabetes (diabetes mellitus) is a long-term (chronic) disease.  It occurs when the body does not properly use sugar (glucose) that is released from food after digestion.  Take insulin and diabetes medicines as told.  Check your blood sugar every day, as often as told.  Keep all follow-up visits as told by your doctor. This is important. This information is not intended to replace advice given to you by your health care provider. Make sure you discuss any questions you have with your health care provider. Document Revised: 07/14/2019 Document Reviewed: 01/23/2018 Elsevier Patient Education  [place].

## 2020-03-22 NOTE — Progress Notes (Signed)
Subjective:  Patient ID: Timothy Warren, male    DOB: Aug 15, 1961  Age: 59 y.o. MRN: [id number]  CC: Blood pressure follow-up-Cammie Fulp MD  HPI Timothy Warren, 59 year old male, who is status post new patient visit on 02/17/2020 with another provider by telemedicine to establish care in follow-up of hypertension after emergency department visit for dizziness and headaches at which time patient was started on hydrochlorothiazide for control of hypertension.Marland Kitchen  He reports that he was diagnosed in the past with hypertension and in the past had been on medication and blood pressures normalized and he did not keep continued follow-up.  When he presented to the emergency department, he had had an episode at work of feeling dizzy, having blurred vision and being lightheaded.  He states that this happened about a month prior also at work and he drove himself home.  When his most recent episode happened, because he was having issues with blurred vision, another coworker drove him to the hospital/ED for evaluation.  He does not feel as if he is having any blurred vision currently and he denies increased thirst.  He has noticed that he does tend to urinate frequently including having to get up at night to urinate.  No dysuria and no straining to initiating urinary stream and no change in force of urination/weakening of urinary stream.         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  He has never been diagnosed with illnesses in the past other than high blood pressure.  He believes that his mother and father may have had hypertension but both parents are deceased.  He is not aware of any prior diagnosis of issues with his kidneys or urinary tract, no prior diagnosis of diabetes.  He reports that he does drink alcohol/beer several times per week.  Past Medical History:  Diagnosis Date  . Seasonal allergies      Past Surgical History:  Procedure Laterality Date  . NO PAST SURGERIES      Family History  Problem Relation Age of Onset  . Hypertension Mother   . Hypertension Father     Social History   Tobacco Use  . Smoking status: Never Smoker  . Smokeless tobacco: Never Used  Substance Use Topics  . Alcohol use: Yes    Alcohol/week: 1.0 standard drinks    Types: 1 Cans of beer per week    ROS Review of Systems  Constitutional: Negative for chills, fatigue and fever.  HENT: Negative for sore throat and trouble swallowing.   Eyes: Positive for visual disturbance. Negative for photophobia.  Respiratory: Negative for cough and shortness of breath.   Cardiovascular: Negative for chest pain and palpitations.  Gastrointestinal: Negative for abdominal pain, constipation, diarrhea and nausea.  Endocrine: Positive for polyuria. Negative for polydipsia and polyphagia.  Genitourinary: Positive for frequency. Negative for dysuria.  Musculoskeletal: Negative for arthralgias and back pain.  Skin: Negative for rash and wound.  Neurological: Positive for headaches. Negative for dizziness.  Hematological: Negative for adenopathy. Does not bruise/bleed easily.  Psychiatric/Behavioral: Negative for self-injury and suicidal ideas.    Objective:   Today's Vitals: BP (!) 151/93   Pulse 83   Temp 98.6 F (37 C) (Temporal)   Ht 5' 9" (1.753 m)   Wt 182 lb 3.2 oz (82.6 kg)   SpO2 96%   BMI 26.91 kg/m   Physical Exam  Constitutional:      General: He is not in acute distress.    Appearance: Normal appearance.  Neck:     Vascular: No carotid bruit.  Cardiovascular:     Rate and Rhythm: Normal rate and regular rhythm.  Pulmonary:     Effort: Pulmonary effort is normal.     Breath sounds: Normal breath sounds.  Abdominal:     Palpations: Abdomen is soft.     Tenderness: There is no abdominal tenderness. There is no right CVA tenderness, left CVA tenderness, guarding or rebound.   Musculoskeletal:        General: No tenderness or deformity.     Cervical back: Normal range of motion.     Right lower leg: No edema.     Left lower leg: No edema.  Lymphadenopathy:     Cervical: No cervical adenopathy.  Skin:    General: Skin is warm and dry.  Neurological:     General: No focal deficit present.     Mental Status: He is alert and oriented to person, place, and time.  Psychiatric:        Mood and Affect: Mood normal.        Behavior: Behavior normal.     Assessment & Plan:  1. Essential hypertension Patient's blood pressure remains elevated.  We will have patient start amlodipine 5 mg once daily in addition to current hydrochlorothiazide.  Patient reports that he is also having some urinary frequency therefore hydrochlorothiazide may be discontinued in the future.  Patient also with elevated blood sugar and hemoglobin A1c consistent with diabetes.  As patient has also had elevated creatinine, will not start ARB or ACE inhibitor at this time until his BMP is repeated to see what his current creatinine level is at this time.  He is encouraged to try and monitor his blood pressure and to follow-up with the clinical pharmacist for blood pressure recheck.  Will obtain microalbumin creatinine ratio in follow-up of uncontrolled blood pressure and elevated creatinine. - Microalbumin/Creatinine Ratio, Urine - amLODipine (NORVASC) 5 MG tablet; Take 1 tablet (5 mg total) by mouth daily. To lower blood pressure  Dispense: 30 tablet; Refill: 3 - hydrochlorothiazide (HYDRODIURIL) 25 MG tablet; Take 1 tablet (25 mg total) by mouth daily.  Dispense: 30 tablet; Refill: 1  2. Chronic kidney disease, unspecified CKD stage Discussed with patient that his creatinine during his ED visit approximately 2 months ago was elevated at 1.71 with EGFR of 50 consistent with chronic kidney disease.  Patient was given educational material on chronic kidney disease as part of after visit summary.  Due to  elevated creatinine, he will have repeat BMP at today's visit as well as urine creatinine microalbumin with ratio.  He may need referral to nephrology if he has had worsening of his renal function.  Discussed with patient that he should also avoid the use of nostril anti-inflammatories or any other medications which can worsen renal function and avoid dehydration.  He is being placed on amlodipine to better help with control of hypertension. - Basic Metabolic Panel - CBC - Microalbumin/Creatinine Ratio, Urine - amLODipine (NORVASC) 5 MG tablet; Take 1 tablet (5 mg total) by mouth daily. To lower blood pressure  Dispense: 30 tablet; Refill: 3  3. Elevated random blood glucose level At his emergency department visit 2 months ago, patient with glucose of 148 and patient with current complaint of urinary frequency.  He will have hemoglobin A1c, BMP as well as hepatic function panel  in follow-up of his elevated blood sugars. - HgB A7G - Basic Metabolic Panel - Hepatic Function Panel  4. Type 2 diabetes mellitus without complication, without long-term current use of insulin (HCC) Patient had point-of-care hemoglobin A1c done at today's visit which was elevated at 7.6 consistent with diagnosis of diabetes.  This was discussed with the patient at today's visit and information on diabetes basics provided as part of after visit summary.  Discussed no concentrated sweets/low carbohydrate diet.  He is to follow-up with clinical pharmacist for further information regarding diabetes and to learn how to self monitor.  Patient will have urinalysis, microalbumin creatinine ratio and hepatic function panel at today's visit and follow-up of his diabetes in addition to basic metabolic panel. - Microalbumin/Creatinine Ratio, Urine - POCT URINALYSIS DIP (CLINITEK) - Hepatic Function Panel  5. Frequent urination Will perform urinalysis and follow-up of patient's complaints of urinary frequency.  Patient also had  hyperglycemia during his recent emergency department visit per lab work.  He will also have hemoglobin A1c at today's visit to see if he may be prediabetic or diabetic. - POCT URINALYSIS DIP (CLINITEK)   Outpatient Encounter Medications as of 03/22/2020  Medication Sig  . hydrochlorothiazide (HYDRODIURIL) 25 MG tablet Take 1 tablet (25 mg total) by mouth daily.  . [DISCONTINUED] hydrochlorothiazide (HYDRODIURIL) 25 MG tablet Take 1 tablet (25 mg total) by mouth daily.  Marland Kitchen amLODipine (NORVASC) 5 MG tablet Take 1 tablet (5 mg total) by mouth daily. To lower blood pressure  . [place]-Glucosamine-Vit D (OSTEO BI-FLEX ONE PER DAY PO) Take 1 tablet by mouth daily.  . Multiple Vitamin (MULTIVITAMIN WITH MINERALS) TABS tablet Take 1 tablet by mouth daily.   No facility-administered encounter medications on file as of 03/22/2020.    An After Visit Summary was printed and given to the patient.   Follow-up: Return in about 4 weeks (around 04/19/2020) for HTN/DM; 1-2 weeks with Luke/CPP for HTN and DM.  35 or more minutes was spent with face-to-face time with the patient obtaining his past and present medical history as well as current medical issues, review of systems, performing physical examination, creating assessment and treatment plan that was then discussed and agreed upon with the patient.  Approximately 8 minutes was spent placing orders at today's visit for medications, and labs.  An additional 15 to 20 minutes was spent reviewing patient's medical chart both before and after his visit as well as creation of today's visit note.   Antony Blackbird MD

## 2020-03-23 ENCOUNTER — Other Ambulatory Visit: Payer: Self-pay | Admitting: Family Medicine

## 2020-03-23 DIAGNOSIS — I10 Essential (primary) hypertension: Secondary | ICD-10-CM

## 2020-03-23 DIAGNOSIS — E119 Type 2 diabetes mellitus without complications: Secondary | ICD-10-CM

## 2020-03-23 DIAGNOSIS — N183 Chronic kidney disease, stage 3 unspecified: Secondary | ICD-10-CM

## 2020-03-23 LAB — HEPATIC FUNCTION PANEL
ALT: 15 IU/L (ref 0–44)
AST: 18 IU/L (ref 0–40)
Albumin: 4.6 g/dL (ref 3.8–4.9)
Alkaline Phosphatase: 75 IU/L (ref 48–121)
Bilirubin Total: <0.2 mg/dL (ref 0.0–1.2)
Bilirubin, Direct: 0.09 mg/dL (ref 0.00–0.40)
Total Protein: 7.1 g/dL (ref 6.0–8.5)

## 2020-03-23 LAB — BASIC METABOLIC PANEL WITH GFR
BUN/Creatinine Ratio: 11 (ref 9–20)
BUN: 22 mg/dL (ref 6–24)
CO2: 27 mmol/L (ref 20–29)
Calcium: 9.8 mg/dL (ref 8.7–10.2)
Chloride: 95 mmol/L — ABNORMAL LOW (ref 96–106)
Creatinine, Ser: 2.04 mg/dL — ABNORMAL HIGH (ref 0.76–1.27)
GFR calc Af Amer: 41 mL/min/1.73 — ABNORMAL LOW
GFR calc non Af Amer: 35 mL/min/1.73 — ABNORMAL LOW
Glucose: 190 mg/dL — ABNORMAL HIGH (ref 65–99)
Potassium: 3.7 mmol/L (ref 3.5–5.2)
Sodium: 139 mmol/L (ref 134–144)

## 2020-03-23 LAB — CBC
Hematocrit: 45.1 % (ref 37.5–51.0)
Hemoglobin: 14.4 g/dL (ref 13.0–17.7)
MCH: 27.3 pg (ref 26.6–33.0)
MCHC: 31.9 g/dL (ref 31.5–35.7)
MCV: 85 fL (ref 79–97)
Platelets: 235 x10E3/uL (ref 150–450)
RBC: 5.28 x10E6/uL (ref 4.14–5.80)
RDW: 13.9 % (ref 11.6–15.4)
WBC: 7.8 x10E3/uL (ref 3.4–10.8)

## 2020-03-23 LAB — MICROALBUMIN / CREATININE URINE RATIO
Creatinine, Urine: 218 mg/dL
Microalb/Creat Ratio: 4 mg/g{creat} (ref 0–29)
Microalbumin, Urine: 9.1 ug/mL

## 2020-03-24 ENCOUNTER — Telehealth: Payer: Self-pay

## 2020-03-24 NOTE — Telephone Encounter (Signed)
Called number provided and it keeps ringing and no VM

## 2020-03-24 NOTE — Telephone Encounter (Signed)
Patient returned call in regards to his test results.  Please advice patient [phone]

## 2020-03-24 NOTE — Telephone Encounter (Signed)
LMOM

## 2020-03-26 ENCOUNTER — Encounter: Payer: Self-pay | Admitting: Family Medicine

## 2020-04-11 ENCOUNTER — Ambulatory Visit: Payer: 59 | Admitting: Pharmacist

## 2020-04-15 ENCOUNTER — Other Ambulatory Visit: Payer: Self-pay | Admitting: Physician Assistant

## 2020-04-15 DIAGNOSIS — I10 Essential (primary) hypertension: Secondary | ICD-10-CM

## 2020-04-17 ENCOUNTER — Ambulatory Visit: Payer: 59 | Attending: Family Medicine | Admitting: Pharmacist

## 2020-04-17 ENCOUNTER — Other Ambulatory Visit: Payer: Self-pay

## 2020-04-19 ENCOUNTER — Ambulatory Visit: Payer: 59 | Admitting: Family Medicine

## 2020-04-24 ENCOUNTER — Ambulatory Visit: Payer: 59 | Attending: Family Medicine | Admitting: Pharmacist

## 2020-04-24 ENCOUNTER — Other Ambulatory Visit: Payer: Self-pay

## 2020-04-24 ENCOUNTER — Encounter: Payer: Self-pay | Admitting: Pharmacist

## 2020-04-24 VITALS — BP 130/81 | HR 85

## 2020-04-24 DIAGNOSIS — I10 Essential (primary) hypertension: Secondary | ICD-10-CM | POA: Diagnosis not present

## 2020-04-24 DIAGNOSIS — E119 Type 2 diabetes mellitus without complications: Secondary | ICD-10-CM

## 2020-04-24 MED ORDER — ONETOUCH ULTRASOFT LANCETS MISC: 2 refills | Status: DC

## 2020-04-24 MED ORDER — ONETOUCH ULTRA 2 W/DEVICE KIT: PACK | 0 refills | Status: DC

## 2020-04-24 MED ORDER — ONETOUCH ULTRA VI STRP: ORAL_STRIP | 2 refills | Status: DC

## 2020-04-24 NOTE — Progress Notes (Signed)
    S:     No chief complaint on file.  Patient arrives in good spirits.  Presents for diabetes evaluation, education, and management Patient was referred and last seen by Primary Care Provider on 03/22/2020.    Patient reports Diabetes was dx in May.    Family/Social History:  -FHx: HTN -Tobacco: never smoker -Alcohol: denies use   Insurance coverage/medication affordability:  [employer]  Medication adherence reported however patient does not take medications for DM.   Current diabetes medications include: none Current hypertension medications include: amlodipine 5 mg daily Current hyperlipidemia medications include: HCTZ 25 mg daily   Patient denies hypoglycemic events.  Patient reported dietary habits:  - Admits to eating Frosted Flakes - Admits to eating pasta but only occasionally - Admits to eating beans  Patient-reported exercise habits:  - Walks 30 minutes daily    Patient denies nocturia (nighttime urination).  Patient denies neuropathy (nerve pain). Patient denies visual changes. Patient reports self foot exams.     O:  Vitals:   04/24/20 1551  BP: 130/81  Pulse: 85   Lab Results  Component Value Date   HGBA1C 7.6 (A) 03/22/2020   Vitals:   04/24/20 1551  BP: 130/81  Pulse: 85   Lipid Panel  No results found for: CHOL, TRIG, HDL, CHOLHDL, VLDL, LDLCALC, LDLDIRECT  Home fasting blood sugars: not checking   2 hour post-meal/random blood sugars: not checking.  Clinical Atherosclerotic Cardiovascular Disease (ASCVD): No  The ASCVD Risk score Mikey Bussing DC Jr., et al., 2013) failed to calculate for the following reasons:   Cannot find a previous HDL lab   Cannot find a previous total cholesterol lab   A/P: Diabetes newly dx currently above goal. Patient is able to verbalize appropriate hypoglycemia management plan. He does not currently take medications.  GFR of 41. We could initiate metformin at 500 mg daily. Will have patient return for labs  first. Rx for glucometer sent. Counseling regarding home glucose monitoring given.    -OneTouch supplies sent.  -CMP14 + eGFR, future -Extensively discussed pathophysiology of diabetes, recommended lifestyle interventions, dietary effects on blood sugar control -Counseled on s/sx of and management of hypoglycemia -Next A1C anticipated 06/2020.   ASCVD risk - primary prevention in patient with diabetes.Needs lipid panel. Pt would benefit from at least moderate intensity statin therapy.  -Lipid panel, future -Consider starting statin therapy at follow-up   Hypertension longstanding currently at goal.  Blood pressure goal = <130/80 mmHg. Medication adherence reported. -Continue current regimen.   Written patient instructions provided.  Total time in face to face counseling 15 minutes.   Follow up Clinic Visit in 05/10/2020.   Benard Halsted, PharmD, [place] [phone]

## 2020-04-27 ENCOUNTER — Ambulatory Visit: Payer: 59 | Attending: Family Medicine

## 2020-04-27 ENCOUNTER — Other Ambulatory Visit: Payer: Self-pay

## 2020-04-27 DIAGNOSIS — I10 Essential (primary) hypertension: Secondary | ICD-10-CM

## 2020-04-28 LAB — CMP14+EGFR
ALT: 19 IU/L (ref 0–44)
AST: 21 IU/L (ref 0–40)
Albumin/Globulin Ratio: 1.6 (ref 1.2–2.2)
Albumin: 4.1 g/dL (ref 3.8–4.9)
Alkaline Phosphatase: 62 IU/L (ref 48–121)
BUN/Creatinine Ratio: 14 (ref 9–20)
BUN: 24 mg/dL (ref 6–24)
Bilirubin Total: 0.5 mg/dL (ref 0.0–1.2)
CO2: 23 mmol/L (ref 20–29)
Calcium: 9 mg/dL (ref 8.7–10.2)
Chloride: 103 mmol/L (ref 96–106)
Creatinine, Ser: 1.77 mg/dL — ABNORMAL HIGH (ref 0.76–1.27)
GFR calc Af Amer: 48 mL/min/1.73 — ABNORMAL LOW
GFR calc non Af Amer: 42 mL/min/1.73 — ABNORMAL LOW
Globulin, Total: 2.5 g/dL (ref 1.5–4.5)
Glucose: 114 mg/dL — ABNORMAL HIGH (ref 65–99)
Potassium: 4 mmol/L (ref 3.5–5.2)
Sodium: 140 mmol/L (ref 134–144)
Total Protein: 6.6 g/dL (ref 6.0–8.5)

## 2020-04-28 LAB — LIPID PANEL
Chol/HDL Ratio: 3.4 ratio (ref 0.0–5.0)
Cholesterol, Total: 170 mg/dL (ref 100–199)
HDL: 50 mg/dL
LDL Chol Calc (NIH): 101 mg/dL — ABNORMAL HIGH (ref 0–99)
Triglycerides: 107 mg/dL (ref 0–149)
VLDL Cholesterol Cal: 19 mg/dL (ref 5–40)

## 2020-05-10 ENCOUNTER — Other Ambulatory Visit: Payer: Self-pay

## 2020-05-10 ENCOUNTER — Ambulatory Visit: Payer: 59 | Attending: Physician Assistant | Admitting: Physician Assistant

## 2020-05-10 DIAGNOSIS — I10 Essential (primary) hypertension: Secondary | ICD-10-CM | POA: Diagnosis not present

## 2020-05-10 DIAGNOSIS — E119 Type 2 diabetes mellitus without complications: Secondary | ICD-10-CM | POA: Diagnosis not present

## 2020-05-10 DIAGNOSIS — N189 Chronic kidney disease, unspecified: Secondary | ICD-10-CM

## 2020-05-10 MED ORDER — METFORMIN HCL 500 MG PO TABS: 500.0000 mg | ORAL_TABLET | Freq: Two times a day (BID) | ORAL | 3 refills | Status: DC

## 2020-05-10 MED ORDER — HYDROCHLOROTHIAZIDE 25 MG PO TABS: ORAL_TABLET | ORAL | 1 refills | Status: DC

## 2020-05-10 MED ORDER — AMLODIPINE BESYLATE 5 MG PO TABS: 5.0000 mg | ORAL_TABLET | Freq: Every day | ORAL | 3 refills | Status: DC

## 2020-05-10 NOTE — Progress Notes (Signed)
Virtual Visit via Telephone Note  I connected with Timothy Warren on 05/10/20 at  4:10 PM EDT by telephone and verified that I am speaking with the correct person using two identifiers.   I discussed the limitations, risks, security and privacy concerns of performing an evaluation and management service by telephone and the availability of in person appointments. I also discussed with the patient that there may be a patient responsible charge related to this service. The patient expressed understanding and agreed to proceed.   PATIENT visit by telephone virtually in the context of Covid-19 pandemic. Patient location:  home My Location:  [place] office Persons on the call:  Me and the patient  History of Present Illness: Checking blood pressures out of the office.  ~130/80s.  He was not started on diabetes meds but A1C=7.6.  He is unclear of how to work glucometer that he obtained at [employer].     Observations/Objective: NAD.  A&Ox3   Assessment and Plan: 1. Essential hypertension At goal-continue current regimen - amLODipine (NORVASC) 5 MG tablet; Take 1 tablet (5 mg total) by mouth daily. To lower blood pressure  Dispense: 30 tablet; Refill: 3 - hydrochlorothiazide (HYDRODIURIL) 25 MG tablet; TAKE 1 TABLET(25 MG) BY MOUTH DAILY  Dispense: 30 tablet; Refill: 1  2. Type 2 diabetes mellitus without complication, without long-term current use of insulin (HCC) A1C=7.6-will start metformin.  I have had a lengthy discussion and provided education about insulin resistance and the intake of too much sugar/refined carbohydrates.  I have advised the patient to work at a goal of eliminating sugary drinks, candy, desserts, sweets, refined sugars, processed foods, and white carbohydrates.  The patient expresses understanding.  - metFORMIN (GLUCOPHAGE) 500 MG tablet; Take 1 tablet (500 mg total) by mouth 2 (two) times daily with a meal.  Dispense: 180 tablet; Refill: 3  3. Chronic kidney disease,  unspecified CKD stage - amLODipine (NORVASC) 5 MG tablet; Take 1 tablet (5 mg total) by mouth daily. To lower blood pressure  Dispense: 30 tablet; Refill: 3   Follow Up Instructions: See PCP in about 8-12 weeks   I discussed the assessment and treatment plan with the patient. The patient was provided an opportunity to ask questions and all were answered. The patient agreed with the plan and demonstrated an understanding of the instructions.   The patient was advised to call back or seek an in-person evaluation if the symptoms worsen or if the condition fails to improve as anticipated.  I provided 11 minutes of non-face-to-face time during this encounter.   Freeman Caldron, PA-C  Patient ID: Timothy Warren, male   DOB: [birth year], 59 y.o.   MRN: [id number]

## 2020-07-12 ENCOUNTER — Ambulatory Visit: Payer: 59 | Attending: Physician Assistant | Admitting: Physician Assistant

## 2020-07-13 ENCOUNTER — Telehealth: Payer: Self-pay

## 2020-07-13 NOTE — Telephone Encounter (Signed)
Please contact patient or have the front desk contact him to help set up an appointment. I do not schedule patient's therefore I am unsure why he thinks that I made an appointment for him. Maybe he is thinking of a different provider.

## 2020-07-13 NOTE — Telephone Encounter (Signed)
Pt states someone was to put him in as a virtual appt today 07/13/20. Pt missed virtual 07/12/20. Please advise. Pt requesting erecticle dysfunction meds sent to [facility] on [address].

## 2020-07-14 NOTE — Telephone Encounter (Signed)
Spoke to patient. Pt. Have an upcoming appt. W/ PCP October 1st @ 4:10PM.

## 2020-08-04 ENCOUNTER — Encounter: Payer: Self-pay | Admitting: Family Medicine

## 2020-08-04 ENCOUNTER — Ambulatory Visit: Payer: 59 | Attending: Family Medicine | Admitting: Family Medicine

## 2020-08-04 ENCOUNTER — Other Ambulatory Visit: Payer: Self-pay

## 2020-08-04 VITALS — BP 144/81 | HR 62 | Temp 98.1°F | Ht 70.0 in | Wt 167.2 lb

## 2020-08-04 DIAGNOSIS — E119 Type 2 diabetes mellitus without complications: Secondary | ICD-10-CM | POA: Diagnosis not present

## 2020-08-04 DIAGNOSIS — N1831 Chronic kidney disease, stage 3a: Secondary | ICD-10-CM

## 2020-08-04 DIAGNOSIS — I10 Essential (primary) hypertension: Secondary | ICD-10-CM | POA: Diagnosis not present

## 2020-08-04 DIAGNOSIS — E1169 Type 2 diabetes mellitus with other specified complication: Secondary | ICD-10-CM

## 2020-08-04 DIAGNOSIS — N521 Erectile dysfunction due to diseases classified elsewhere: Secondary | ICD-10-CM

## 2020-08-04 LAB — POCT GLYCOSYLATED HEMOGLOBIN (HGB A1C): Hemoglobin A1C: 5.9 % — AB (ref 4.0–5.6)

## 2020-08-04 LAB — GLUCOSE, POCT (MANUAL RESULT ENTRY): POC Glucose: 112 mg/dL — AB (ref 70–99)

## 2020-08-04 MED ORDER — SILDENAFIL CITRATE 100 MG PO TABS: 100.0000 mg | ORAL_TABLET | Freq: Every day | ORAL | 5 refills | Status: DC | PRN

## 2020-08-04 NOTE — Progress Notes (Signed)
Established Patient Office Visit  Subjective:  Patient ID: Timothy Warren, male    DOB: [birth year]  Age: 59 y.o. MRN: [id number]  CC:  Chief Complaint  Patient presents with  . Hypertension    HPI Timothy Warren , 59 year old male, who is seen in follow-up of chronic medical issues including hypertension, chronic kidney disease and type 2 diabetes.  He is status post telemedicine visit on 05/10/2020 at which time he was started on Metformin.  Patient with diagnosis of type 2 diabetes in May of this year with hemoglobin A1c elevated at 7.6 after patient had complaints of blurred vision and urinary frequency.  He did meet with the clinical pharmacist on 04/24/2020 regarding hypertension and new onset diabetes.  He was provided with prescription for diabetic testing supplies and was to return with blood sugar diary and for initiation of therapy with Metformin but did not follow-up with clinical pharmacist.  He reports that he now takes the Metformin but sometimes forgets therefore is not taking the medication daily.  He is not currently monitoring his home blood sugars.  He feels that his blood pressure has been good as blood pressure has been good when he has had his blood pressure checked but he cannot recall the exact numbers.  He reports that he is here today for follow-up of diabetes, hypertension and he would like to have a prescription for "the little blue pill".  Upon questioning, he has tried Viagra in the past and would like a prescription for this medication.  He states that this medication did work and he did not have any side effects such as chest pain, shortness of breath or changes in vision with use of the medication in the past.  He has been trying to make sure that he takes his blood pressure medication daily and drinking water after being made aware of diagnosis of chronic kidney disease at past appointments.  He reports that overall he feels well at today's visit.   Past Medical History:   Diagnosis Date  . Seasonal allergies     Past Surgical History:  Procedure Laterality Date  . NO PAST SURGERIES      Family History  Problem Relation Age of Onset  . Hypertension Mother   . Hypertension Father     Social History   Socioeconomic History  . Marital status: Single    Spouse name: Not on file  . Number of children: Not on file  . Years of education: Not on file  . Highest education level: Not on file  Occupational History  . Not on file  Tobacco Use  . Smoking status: Never Smoker  . Smokeless tobacco: Never Used  Vaping Use  . Vaping Use: Never used  Substance and Sexual Activity  . Alcohol use: Yes    Alcohol/week: 1.0 standard drink    Types: 1 Cans of beer per week  . Drug use: Yes    Types: Marijuana  . Sexual activity: Not on file  Other Topics Concern  . Not on file  Social History Narrative  . Not on file   Social Determinants of Health   Financial Resource Strain:   . Difficulty of Paying Living Expenses: Not on file  Food Insecurity:   . Worried About Charity fundraiser in the Last Year: Not on file  . Ran Out of Food in the Last Year: Not on file  Transportation Needs:   . Lack of Transportation (Medical): Not on file  .  Lack of Transportation (Non-Medical): Not on file  Physical Activity:   . Days of Exercise per Week: Not on file  . Minutes of Exercise per Session: Not on file  Stress:   . Feeling of Stress : Not on file  Social Connections:   . Frequency of Communication with Friends and Family: Not on file  . Frequency of Social Gatherings with Friends and Family: Not on file  . Attends Religious Services: Not on file  . Active Member of Clubs or Organizations: Not on file  . Attends Archivist Meetings: Not on file  . Marital Status: Not on file  Intimate Partner Violence:   . Fear of Current or Ex-Partner: Not on file  . Emotionally Abused: Not on file  . Physically Abused: Not on file  . Sexually Abused:  Not on file    Outpatient Medications Prior to Visit  Medication Sig Dispense Refill  . amLODipine (NORVASC) 5 MG tablet Take 1 tablet (5 mg total) by mouth daily. To lower blood pressure 30 tablet 3  . Blood Glucose Monitoring Suppl (ONE TOUCH ULTRA 2) w/Device KIT Use to check blood sugar once daily. E11.9 1 kit 0  . Boswellia-Glucosamine-Vit D (OSTEO BI-FLEX ONE PER DAY PO) Take 1 tablet by mouth daily.    Marland Kitchen glucose blood (ONETOUCH ULTRA) test strip Use as instructed to check blood sugar once daily. E11.9 100 each 2  . hydrochlorothiazide (HYDRODIURIL) 25 MG tablet TAKE 1 TABLET(25 MG) BY MOUTH DAILY 30 tablet 1  . Lancets (ONETOUCH ULTRASOFT) lancets Use as instructed to check blood sugar once daily. E11.9 100 each 2  . metFORMIN (GLUCOPHAGE) 500 MG tablet Take 1 tablet (500 mg total) by mouth 2 (two) times daily with a meal. 180 tablet 3  . Multiple Vitamin (MULTIVITAMIN WITH MINERALS) TABS tablet Take 1 tablet by mouth daily.     No facility-administered medications prior to visit.    No Known Allergies  ROS Review of Systems  Constitutional: Negative for chills and fever.  HENT: Negative for sore throat and trouble swallowing.   Eyes: Negative for photophobia and visual disturbance.  Respiratory: Negative for cough and shortness of breath.   Cardiovascular: Negative for chest pain, palpitations and leg swelling.  Gastrointestinal: Negative for abdominal pain, blood in stool, constipation, diarrhea and nausea.  Endocrine: Negative for polydipsia, polyphagia and polyuria.  Genitourinary: Positive for frequency (only if he drinks a lot of water or beer).  Musculoskeletal: Negative for arthralgias and back pain.  Skin: Negative for rash and wound.  Neurological: Negative for dizziness and headaches.  Hematological: Negative for adenopathy. Does not bruise/bleed easily.  Psychiatric/Behavioral: Negative for suicidal ideas. The patient is not nervous/anxious.       Objective:     Physical Exam Vitals and nursing note reviewed.  Constitutional:      General: He is not in acute distress.    Appearance: Normal appearance.  Neck:     Vascular: No carotid bruit.  Cardiovascular:     Rate and Rhythm: Normal rate and regular rhythm.  Pulmonary:     Effort: Pulmonary effort is normal.     Breath sounds: Normal breath sounds.  Abdominal:     Palpations: Abdomen is soft.     Tenderness: There is no abdominal tenderness. There is no right CVA tenderness, left CVA tenderness, guarding or rebound.  Musculoskeletal:     Cervical back: Normal range of motion and neck supple. No tenderness.     Right lower  leg: No edema.     Left lower leg: No edema.  Lymphadenopathy:     Cervical: No cervical adenopathy.  Skin:    General: Skin is warm and dry.     Comments: No active skin breakdown on the feet and good nail care  Neurological:     General: No focal deficit present.     Mental Status: He is alert and oriented to person, place, and time.  Psychiatric:        Mood and Affect: Mood normal.        Behavior: Behavior normal.     BP (!) 144/81 (BP Location: Left Arm, Patient Position: Sitting)   Pulse 62   Temp 98.1 F (36.7 C)   Ht '5\' 10"'  (1.778 m)   Wt 167 lb 3.2 oz (75.8 kg)   SpO2 99%   BMI 23.99 kg/m  Wt Readings from Last 3 Encounters:  08/04/20 167 lb 3.2 oz (75.8 kg)  03/22/20 182 lb 3.2 oz (82.6 kg)  01/17/20 192 lb 4.8 oz (87.2 kg)     Health Maintenance Due  Topic Date Due  . Hepatitis C Screening  Never done  . PNEUMOCOCCAL POLYSACCHARIDE VACCINE AGE 80-64 HIGH RISK  Never done  . FOOT EXAM  Never done  . OPHTHALMOLOGY EXAM  Never done  . COVID-19 Vaccine (1) Never done  . HIV Screening  Never done  . COLONOSCOPY  Never done  . INFLUENZA VACCINE  Never done    He does not wish to be referred for colonoscopy or have influenza immunization at this time. He is still considering whether or not to have his COVID immunization. Immunization for  COVID discussed and encouraged.   No results found for: TSH Lab Results  Component Value Date   WBC 7.2 08/04/2020   HGB 13.3 08/04/2020   HCT 39.3 08/04/2020   MCV 85 08/04/2020   PLT 253 08/04/2020   Lab Results  Component Value Date   NA 138 08/04/2020   K 3.1 (L) 08/04/2020   CO2 26 08/04/2020   GLUCOSE 103 (H) 08/04/2020   BUN 23 08/04/2020   CREATININE 1.55 (H) 08/04/2020   BILITOT 0.5 04/27/2020   ALKPHOS 62 04/27/2020   AST 21 04/27/2020   ALT 19 04/27/2020   PROT 6.6 04/27/2020   ALBUMIN 4.5 08/04/2020   CALCIUM 9.5 08/04/2020   ANIONGAP 9 01/17/2020   Lab Results  Component Value Date   CHOL 170 04/27/2020   Lab Results  Component Value Date   HDL 50 04/27/2020   Lab Results  Component Value Date   LDLCALC 101 (H) 04/27/2020   Lab Results  Component Value Date   TRIG 107 04/27/2020   Lab Results  Component Value Date   CHOLHDL 3.4 04/27/2020   Lab Results  Component Value Date   HGBA1C 5.9 (A) 08/04/2020      Assessment & Plan:  1. Type 2 diabetes mellitus without complication, without long-term current use of insulin ([place]) Patient with random glucose of 112 at today's visit and hemoglobin A1c of 5.9.  He reports that he has made changes in his diet in addition to taking the Metformin.  He is encouraged to monitor home blood sugars, continue healthy diet along with continued use of Metformin.  Diabetic foot care discussed at today's visit.  Discussed COVID-19 vaccination as he has diabetes places him in a group with increased risk of poor outcome if he contracts the COVID-19 virus.  He was also offered  influenza immunization at today's visit which he declined.  He reports that he does not need a refill of Metformin at this time. - POCT glucose (manual entry) - POCT glycosylated hemoglobin (Hb A1C)  2. Stage 3a chronic kidney disease (HCC) Educational material on chronic kidney disease provided as part of patient's after visit summary at today's  visit.  He will have renal function panel and CBC at today's visit and follow-up of chronic kidney disease which can also cause anemia.  He has previously been referred to nephrology and is awaiting an appointment.  He is aware of the need to control blood pressure, diabetes, remain hydrated with water and avoid the use of nonsteroidal anti-inflammatory medications.  Review of chart, patient has had longstanding issues with uncontrolled and poorly controlled hypertension with a creatinine of 1.7 back in 2016. - Renal Function Panel - CBC  3. Essential hypertension Blood pressure is improved but not at goal of 120/80 or less which was discussed at today's visit.  He is encouraged to continue to remain compliant with his blood pressure medications and follow a low-sodium diet.  He is currently on amlodipine and hydrochlorothiazide.  On review of chart, patient was on lisinopril in the past as far back as 2016 and he does not recall when or why this medication was discontinued.  4. Erectile dysfunction associated with type 2 diabetes mellitus [facility]) Patient requests prescription for Viagra for treatment of erectile dysfunction.  He reports that he has tried the medication in the past but did not actually have a prescription for the medication.  Discussed with patient that his erectile dysfunction is likely due to a combination of his type 2 diabetes in addition to longstanding issues with hypertension which in the past has not been well controlled.- sildenafil (VIAGRA) 100 MG tablet; Take 1 tablet (100 mg total) by mouth daily as needed for erectile dysfunction.  Dispense: 10 tablet; Refill: 5    Follow-up: Return in about 4 months (around 12/05/2020) for chronic issues- sooner if needed.   Antony Blackbird, MD

## 2020-08-04 NOTE — Patient Instructions (Signed)
Chronic Kidney Disease, Adult Chronic kidney disease (CKD) happens when the kidneys are damaged over a long period of time. The kidneys are two organs that help with:  Getting rid of waste and extra fluid from the blood.  Making hormones that maintain the amount of fluid in your tissues and blood vessels.  Making sure that the body has the right amount of fluids and chemicals. Most of the time, CKD does not go away, but it can usually be controlled. Steps must be taken to slow down the kidney damage or to stop it from getting worse. If this is not done, the kidneys may stop working. Follow these instructions at home: Medicines  Take over-the-counter and prescription medicines only as told by your doctor. You may need to change the amount of medicines you take.  Do not take any new medicines unless your doctor says it is okay. Many medicines can make your kidney damage worse.  Do not take any vitamin and supplements unless your doctor says it is okay. Many vitamins and supplements can make your kidney damage worse. General instructions  Follow a diet as told by your doctor. You may need to stay away from: ? Alcohol. ? Salty foods. ? Foods that are high in:  Potassium.  Calcium.  Protein.  Do not use any products that contain nicotine or tobacco, such as cigarettes and e-cigarettes. If you need help quitting, ask your doctor.  Keep track of your blood pressure at home. Tell your doctor about any changes.  If you have diabetes, keep track of your blood sugar as told by your doctor.  Try to stay at a healthy weight. If you need help, ask your doctor.  Exercise at least 30 minutes a day, 5 days a week.  Stay up-to-date with your shots (immunizations) as told by your doctor.  Keep all follow-up visits as told by your doctor. This is important. Contact a doctor if:  Your symptoms get worse.  You have new symptoms. Get help right away if:  You have symptoms of end-stage  kidney disease. These may include: ? Headaches. ? Numbness in your hands or feet. ? Easy bruising. ? Having hiccups often. ? Chest pain. ? Shortness of breath. ? Stopping of menstrual periods in women.  You have a fever.  You have very little pee (urine).  You have pain or bleeding when you pee. Summary  Chronic kidney disease (CKD) happens when the kidneys are damaged over a long period of time.  Most of the time, this condition does not go away, but it can usually be controlled. Steps must be taken to slow down the kidney damage or to stop it from getting worse.  Treatment may include a combination of medicines and lifestyle changes. This information is not intended to replace advice given to you by your health care provider. Make sure you discuss any questions you have with your health care provider. Document Revised: 10/03/2017 Document Reviewed: 11/25/2016 Elsevier Patient Education  2020 [employer].  

## 2020-08-05 LAB — RENAL FUNCTION PANEL
Albumin: 4.5 g/dL (ref 3.8–4.9)
BUN/Creatinine Ratio: 15 (ref 9–20)
BUN: 23 mg/dL (ref 6–24)
CO2: 26 mmol/L (ref 20–29)
Calcium: 9.5 mg/dL (ref 8.7–10.2)
Chloride: 94 mmol/L — ABNORMAL LOW (ref 96–106)
Creatinine, Ser: 1.55 mg/dL — ABNORMAL HIGH (ref 0.76–1.27)
GFR calc Af Amer: 56 mL/min/1.73 — ABNORMAL LOW
GFR calc non Af Amer: 49 mL/min/1.73 — ABNORMAL LOW
Glucose: 103 mg/dL — ABNORMAL HIGH (ref 65–99)
Phosphorus: 3.5 mg/dL (ref 2.8–4.1)
Potassium: 3.1 mmol/L — ABNORMAL LOW (ref 3.5–5.2)
Sodium: 138 mmol/L (ref 134–144)

## 2020-08-05 LAB — CBC
Hematocrit: 39.3 % (ref 37.5–51.0)
Hemoglobin: 13.3 g/dL (ref 13.0–17.7)
MCH: 28.7 pg (ref 26.6–33.0)
MCHC: 33.8 g/dL (ref 31.5–35.7)
MCV: 85 fL (ref 79–97)
Platelets: 253 x10E3/uL (ref 150–450)
RBC: 4.63 x10E6/uL (ref 4.14–5.80)
RDW: 13.9 % (ref 11.6–15.4)
WBC: 7.2 x10E3/uL (ref 3.4–10.8)

## 2020-08-07 ENCOUNTER — Telehealth: Payer: Self-pay | Admitting: Family Medicine

## 2020-08-07 ENCOUNTER — Ambulatory Visit: Payer: Self-pay | Admitting: *Deleted

## 2020-08-07 NOTE — Telephone Encounter (Signed)
Pt given lab results per notes of Dr.Fulp on 08/07/20. Pt verbalized understanding.

## 2020-08-07 NOTE — Telephone Encounter (Addendum)
Patient was advised by the pharmacist sildenafil (VIAGRA) 100 MG tablet was in need of PA, patient would like to know the status   [facility] [place], [address] Phone:  [phone]  Fax:  [phone]

## 2020-08-08 ENCOUNTER — Other Ambulatory Visit: Payer: Self-pay

## 2020-08-08 DIAGNOSIS — N521 Erectile dysfunction due to diseases classified elsewhere: Secondary | ICD-10-CM

## 2020-08-08 MED ORDER — SILDENAFIL CITRATE 100 MG PO TABS: 100.0000 mg | ORAL_TABLET | Freq: Every day | ORAL | 5 refills | Status: DC | PRN

## 2020-08-08 NOTE — Telephone Encounter (Signed)
Timothy Warren, I reviewed formulary for current insurance and it states PA required w/ QL of 6 tablets per 30 days, I contacted the pharmacy and asked to run for #6 instead of #10 as prescribed, still did not go through, can PA be completed for this or still recommend GoodRx coupon? Thanks!

## 2020-08-08 NOTE — Telephone Encounter (Signed)
When prescribed for ED, no PA needed.  Pt must abide by ins quantity limits or use coupon from goodrx.com for discounted price.

## 2020-08-08 NOTE — Telephone Encounter (Signed)
Found GoodRx coupon for pt, provider needs to sign printed script, will contact pt to pick-up or mail to him once signed by provider.

## 2020-08-08 NOTE — Progress Notes (Signed)
Attempted to print script for pt for use w/ GoodRx coupon.

## 2020-08-08 NOTE — Telephone Encounter (Signed)
Copied from [place] [id number]. Topic: General - Other >> Aug 07, 2020  3:38 PM Erick Blinks wrote: Reason for CRM: Pt reported PA is required for his VIAGRA at [facility] on [address].

## 2020-08-09 NOTE — Telephone Encounter (Signed)
PA has been approved for 6 tabs as 30 day supply thru 08/09/21

## 2020-08-09 NOTE — Telephone Encounter (Signed)
Contacted pt's pharmacy and informed PA approved, ATC pt to inform, no answer, left discreet message informing PA approved and pharmacy will contact when ready to be picked up

## 2020-08-12 DIAGNOSIS — E119 Type 2 diabetes mellitus without complications: Secondary | ICD-10-CM | POA: Insufficient documentation

## 2020-08-12 DIAGNOSIS — N521 Erectile dysfunction due to diseases classified elsewhere: Secondary | ICD-10-CM | POA: Insufficient documentation

## 2020-08-12 DIAGNOSIS — E1169 Type 2 diabetes mellitus with other specified complication: Secondary | ICD-10-CM | POA: Insufficient documentation

## 2020-08-12 DIAGNOSIS — N1831 Chronic kidney disease, stage 3a: Secondary | ICD-10-CM | POA: Insufficient documentation

## 2020-09-18 ENCOUNTER — Other Ambulatory Visit: Payer: Self-pay

## 2020-09-18 ENCOUNTER — Encounter: Payer: Self-pay | Admitting: Family Medicine

## 2020-09-18 ENCOUNTER — Ambulatory Visit: Payer: 59 | Attending: Family Medicine | Admitting: Family Medicine

## 2020-09-18 VITALS — BP 129/75 | HR 64 | Wt 170.1 lb

## 2020-09-18 DIAGNOSIS — Z1211 Encounter for screening for malignant neoplasm of colon: Secondary | ICD-10-CM | POA: Diagnosis not present

## 2020-09-18 DIAGNOSIS — M545 Low back pain, unspecified: Secondary | ICD-10-CM

## 2020-09-18 DIAGNOSIS — R200 Anesthesia of skin: Secondary | ICD-10-CM

## 2020-09-18 DIAGNOSIS — Z125 Encounter for screening for malignant neoplasm of prostate: Secondary | ICD-10-CM

## 2020-09-18 DIAGNOSIS — R202 Paresthesia of skin: Secondary | ICD-10-CM

## 2020-09-18 NOTE — Progress Notes (Signed)
Aching dull pain in left leg runs up to lower back

## 2020-09-18 NOTE — Patient Instructions (Signed)
Colorectal Cancer Screening  Colorectal cancer screening is a group of tests that are used to check for colorectal cancer before symptoms develop. Colorectal refers to the colon and rectum. The colon and rectum are located at the end of the digestive tract and carry bowel movements out of the body. Who should have screening? All adults starting at age 58 until age 87 should have screening. Your health care provider may recommend screening at age 71. You will have tests every 1-10 years, depending on your results and the type of screening test. You may have screening tests starting at an earlier age, or more frequently than other people, if you have any of the following risk factors:  A personal or family history of colorectal cancer or abnormal growths (polyps).  Inflammatory bowel disease, such as ulcerative colitis or Crohn's disease.  A history of having radiation treatment to the abdomen or pelvic area for cancer.  Colorectal cancer symptoms, such as changes in bowel habits or blood in your stool.  A type of colon cancer syndrome that is passed from parent to child (hereditary), such as: ? Lynch syndrome. ? Familial adenomatous polyposis. ? Turcot syndrome. ? Peutz-Jeghers syndrome. Screening recommendations for adults who are 72-48 years old vary depending on health. How is screening done? There are several types of colorectal screening tests. You may have one or more of the following:  Guaiac-based fecal occult blood testing. For this test, a stool (feces) sample is checked for hidden (occult) blood, which could be a sign of colorectal cancer.  Fecal immunochemical test (FIT). For this test, a stool sample is checked for blood, which could be a sign of colorectal cancer.  Stool DNA test. For this test, a stool sample is checked for blood and changes in DNA that could lead to colorectal cancer.  Sigmoidoscopy. During this test, a thin, flexible tube with a camera on the end  (sigmoidoscope) is used to examine the rectum and the lower colon.  Colonoscopy. During this test, a long, flexible tube with a camera on the end (colonoscope) is used to examine the entire colon and rectum. With a colonoscopy, it is possible to take a sample of tissue (biopsy) and remove small polyps during the test.  Virtual colonoscopy. Instead of a colonoscope, this type of colonoscopy uses X-rays (CT scan) and computers to produce images of the colon and rectum. What are the benefits of screening? Screening reduces your risk for colorectal cancer and can help identify cancer at an early stage, when the cancer can be removed or treated more easily. It is common for polyps to form in the lining of the colon, especially as you age. These polyps may be cancerous or become cancerous over time. Screening can identify these polyps. What are the risks of screening? Each screening test may have different risks.  Stool sample tests have fewer risks than other types of screening tests. However, you may need more tests to confirm results from a stool sample test.  Screening tests that involve X-rays expose you to low levels of radiation, which may slightly increase your cancer risk. The benefit of detecting cancer outweighs the slight increase in risk.  Screening tests such as sigmoidoscopy and colonoscopy may place you at risk for bleeding, intestinal damage, infection, or a reaction to medicines given during the exam. Talk with your health care provider to understand your risk for colorectal cancer and to make a screening plan that is right for you. Questions to ask your health care  provider  When should I start colorectal cancer screening?  What is my risk for colorectal cancer?  How often do I need screening?  Which screening tests do I need?  How do I get my test results?  What do my results mean? Where to find more information Learn more about colorectal cancer screening from:  The  [place]: www.cancer.org  The [employer]: www.cancer.gov Summary  Colorectal cancer screening is a group of tests used to check for colorectal cancer before symptoms develop.  Screening reduces your risk for colorectal cancer and can help identify cancer at an early stage, when the cancer can be removed or treated more easily.  All adults starting at age 45 until age 58 should have screening. Your health care provider may recommend screening at age 82.  You may have screening tests starting at an earlier age, or more frequently than other people, if you have certain risk factors.  Talk with your health care provider to understand your risk for colorectal cancer and to make a screening plan that is right for you. This information is not intended to replace advice given to you by your health care provider. Make sure you discuss any questions you have with your health care provider. Document Revised: 02/10/2019 Document Reviewed: 07/23/2017 Elsevier Patient Education  2020 [place] Screening for Men A cancer screening is a test or exam that checks for cancer. Your health care provider will recommend specific cancer screenings based on your age, personal history, and family history of cancer. Work with your health care provider to create a cancer screening schedule that protects your health. Why is cancer screening done? Cancer screening is done to look for cancer in the very early stages, before it spreads and becomes harder to treat and before you would start to notice symptoms. Finding cancer early improves the chances of successful treatment. It may save your life. Who should be screened for cancer? All men should be screened for colorectal cancer and skin cancer. Your health care provider may recommend screenings for other types of cancer if:  You had cancer before.  You have a family member with cancer.  You have abnormal genes that could  increase the risk of cancer.  You have risk factors for certain cancers, such as smoking. When you should be screened for cancer depends on:  Your age.  Your medical history and your family's medical history.  Certain lifestyle factors, such as smoking.  Environmental exposure, such as to asbestos. What are some common cancer screenings? Lung cancer Lung cancer screening is done with a CT scan that looks for abnormal cells in the lungs. Discuss lung cancer screening with your health care provider if you are 46-55 years old and if any of the following apply to you:  You currently smoke.  You used to smoke heavily.  You have a smoking history of 1 pack a day for 30 years or 2 packs a day for 15 years.  You have quit smoking within the past 15 years. If you smoke heavily or if you used to smoke, you may need to be screened every year. Prostate cancer Prostate cancer screening is done with blood tests and an exam in which a health care provider uses a gloved finger to check prostate size (digital rectal exam). You may need to be screened for prostate cancer if:  You have risk factors of prostate cancer, such as being African American or having a close family member  with prostate cancer.  You have inherited gene changes or a genetic condition, including BRCA1 or BRCA2 gene mutations or Lynch syndrome.  You have symptoms of prostate cancer, such as problems urinating or erectile dysfunction. Prostate cancer screening for men with average risk may start at age 9. Men with risk factors may need to be screened earlier at age 47-45. Once you have been screened for prostate cancer, future screening may be recommended based on the results of your blood tests. Colorectal cancer  All adults should have screening for colorectal cancer starting at age 27 and continuing until age 79. Your health care provider may recommend screening at age 46. You will have tests every 1-10 years, depending on  your results and the type of screening test. If you have a family history of colon or rectal cancer or other risk factors, you may need to start having screenings earlier. Talk with your health care provider about which screening test is right for you and how often you should be screened. Colorectal cancer screening looks for cancer or for growths called polyps that often form before cancer starts. Tests to look for cancer or polyps include:  Colonoscopy or flexible sigmoidoscopy. For these procedures, a flexible tube with a small camera is inserted into the rectum.  CT colonography. This test uses X-rays and a contrast dye to check the colon for polyps. If a polyp is found, you may need to have a colonoscopy so the polyp can be located and removed. Tests to look for cancer in the stool (feces) include:  Guaiac-based fecal occult blood test (FOBT). This test detects blood in stool. It can be done at home with a kit.  Fecal immunochemical test (FIT). This test detects blood in stool. For this test, you will need to collect stool samples at home.  Stool DNA test. This test looks for blood in stool and any changes in DNA that can lead to colon cancer. For this test, you will need to collect a stool sample at home and send it to a lab.  Skin cancer Skin cancer screening is done by checking the skin for unusual moles or spots and any changes in existing moles. Your health care provider should check your skin for signs of skin cancer at every physical exam. You should check your skin every month and tell your health care provider right away if anything looks unusual. Men with a higher-than-normal risk for skin cancer may want to see a skin specialist (dermatologist) for an annual body check. Where to find more information  [place]: SkinPromotion.no  Centers for Disease Control and Prevention:  http://knight-sullivan.biz/  American Cancer Society: https://www.cancer.org/latest-news/4-cancer-screening-tests-for-men.html Contact a health care provider if:  You have concerns about any signs or symptoms of cancer, such as: ? Moles that have an unusual shape or color. ? Changes in existing moles. ? A sore on your skin that does not heal. ? Blood in your urine or stool. ? Fatigue that does not go away. ? Frequent pain or cramping in your abdomen. ? Coughing or trouble breathing that does not go away. ? Coughing up blood. ? Losing weight without trying. ? Changes in urination habits. ? Painful urination or ejaculation. Summary  Be aware of and watch for signs and symptoms of cancer, especially symptoms of lung cancer, prostate cancer, colorectal cancer, and skin cancer.  Early detection of cancer with cancer screening may save your life.  Talk with your health care provider about your specific cancer risks.  Work together with your health care provider to create a cancer screening plan that is right for you. This information is not intended to replace advice given to you by your health care provider. Make sure you discuss any questions you have with your health care provider. Document Revised: 07/10/2018 Document Reviewed: 07/18/2016 Elsevier Patient Education  [place].

## 2020-09-18 NOTE — Progress Notes (Signed)
Established Patient Office Visit  Subjective:  Patient ID: Timothy Warren, male    DOB: 28-Jan-1961  Age: 59 y.o. MRN: [id number]  CC:  Chief Complaint  Patient presents with  . Leg Pain    HPI Timothy Warren, 59 year old male, who presents today secondary to complaint of a few months of recurrent episodes of pain in the left low back and when he had the left low back pain he often has shooting pain in his left thigh.  Pain episodes are currently mild and usually a 5 or less on a 0-to-10 scale.  He has noticed that occasionally he will also have sensation that his left knee will suddenly give way.  He denies any prior back injury or injury to the hip or leg.        He has also noticed for a few years that he will have occasional numbness in both hands.  He is not awakened at night with numbness or tingling of his hands.  He did notice in the past that when he worked out more heavily/lifting heavy weights, that he would have increased pain at his elbows but he has not been doing any heavy weight lifting recently.         He has been thinking about cancer screening since this was discussed in his last visit and he would like more information on screening for prostate cancer as well as screening for colon cancer.  Past Medical History:  Diagnosis Date  . Seasonal allergies     Past Surgical History:  Procedure Laterality Date  . NO PAST SURGERIES      Family History  Problem Relation Age of Onset  . Hypertension Mother   . Hypertension Father     Social History   Socioeconomic History  . Marital status: Single    Spouse name: Not on file  . Number of children: Not on file  . Years of education: Not on file  . Highest education level: Not on file  Occupational History  . Not on file  Tobacco Use  . Smoking status: Never Smoker  . Smokeless tobacco: Never Used  Vaping Use  . Vaping Use: Never used  Substance and Sexual Activity  . Alcohol use: Yes    Alcohol/week: 1.0  standard drink    Types: 1 Cans of beer per week  . Drug use: Yes    Types: Marijuana  . Sexual activity: Not on file  Other Topics Concern  . Not on file  Social History Narrative  . Not on file   Social Determinants of Health   Financial Resource Strain:   . Difficulty of Paying Living Expenses: Not on file  Food Insecurity:   . Worried About Charity fundraiser in the Last Year: Not on file  . Ran Out of Food in the Last Year: Not on file  Transportation Needs:   . Lack of Transportation (Medical): Not on file  . Lack of Transportation (Non-Medical): Not on file  Physical Activity:   . Days of Exercise per Week: Not on file  . Minutes of Exercise per Session: Not on file  Stress:   . Feeling of Stress : Not on file  Social Connections:   . Frequency of Communication with Friends and Family: Not on file  . Frequency of Social Gatherings with Friends and Family: Not on file  . Attends Religious Services: Not on file  . Active Member of Clubs or Organizations: Not on file  .  Attends Archivist Meetings: Not on file  . Marital Status: Not on file  Intimate Partner Violence:   . Fear of Current or Ex-Partner: Not on file  . Emotionally Abused: Not on file  . Physically Abused: Not on file  . Sexually Abused: Not on file    Outpatient Medications Prior to Visit  Medication Sig Dispense Refill  . amLODipine (NORVASC) 5 MG tablet Take 1 tablet (5 mg total) by mouth daily. To lower blood pressure 30 tablet 3  . Blood Glucose Monitoring Suppl (ONE TOUCH ULTRA 2) w/Device KIT Use to check blood sugar once daily. E11.9 1 kit 0  . [place]-Glucosamine-Vit D (OSTEO BI-FLEX ONE PER DAY PO) Take 1 tablet by mouth daily.    Marland Kitchen glucose blood (ONETOUCH ULTRA) test strip Use as instructed to check blood sugar once daily. E11.9 100 each 2  . hydrochlorothiazide (HYDRODIURIL) 25 MG tablet TAKE 1 TABLET(25 MG) BY MOUTH DAILY 30 tablet 1  . Lancets (ONETOUCH ULTRASOFT) lancets Use  as instructed to check blood sugar once daily. E11.9 100 each 2  . metFORMIN (GLUCOPHAGE) 500 MG tablet Take 1 tablet (500 mg total) by mouth 2 (two) times daily with a meal. 180 tablet 3  . Multiple Vitamin (MULTIVITAMIN WITH MINERALS) TABS tablet Take 1 tablet by mouth daily.    . sildenafil (VIAGRA) 100 MG tablet Take 1 tablet (100 mg total) by mouth daily as needed for erectile dysfunction. 10 tablet 5   No facility-administered medications prior to visit.    No Known Allergies  ROS Review of Systems  Constitutional: Negative for chills and fatigue.  HENT: Negative for sore throat and trouble swallowing.   Respiratory: Negative for cough and shortness of breath.   Cardiovascular: Negative for chest pain and palpitations.  Gastrointestinal: Negative for abdominal pain, constipation, diarrhea and nausea.  Endocrine: Negative for polydipsia, polyphagia and polyuria.  Genitourinary: Negative for dysuria, flank pain and frequency.  Musculoskeletal: Positive for arthralgias, back pain and gait problem.       Episodes of left sided low back pain causing radiation of pain to the left leg and sometimes the left leg will suddenly give away  Neurological: Positive for numbness. Negative for dizziness and headaches.  Hematological: Negative for adenopathy. Does not bruise/bleed easily.      Objective:    Physical Exam Constitutional:      General: He is not in acute distress.    Appearance: Normal appearance.  Cardiovascular:     Rate and Rhythm: Normal rate and regular rhythm.  Pulmonary:     Effort: Pulmonary effort is normal.     Breath sounds: Normal breath sounds.  Abdominal:     Palpations: Abdomen is soft.     Tenderness: There is no abdominal tenderness. There is no right CVA tenderness, left CVA tenderness, guarding or rebound.  Musculoskeletal:        General: No tenderness.     Right lower leg: No edema.     Left lower leg: No edema.     Comments: No reproducible leg  pain at today's visit. Presence lumbar paraspinous spasm .Negative seated leg raise and no pain with ROM of the left hip. No tenderness/pain at the epicondyles of the elbows  Skin:    General: Skin is warm and dry.  Neurological:     General: No focal deficit present.     Mental Status: He is alert and oriented to person, place, and time.     Gait: Gait  normal.     Comments: Negative Tinel and phalen signs at the wrists  Psychiatric:        Mood and Affect: Mood normal.        Behavior: Behavior normal.     BP 129/75 (BP Location: Left Arm, Patient Position: Sitting)   Pulse 64   Wt 170 lb 1.6 oz (77.2 kg)   SpO2 98%   BMI 24.41 kg/m  Wt Readings from Last 3 Encounters:  09/18/20 170 lb 1.6 oz (77.2 kg)  08/04/20 167 lb 3.2 oz (75.8 kg)  03/22/20 182 lb 3.2 oz (82.6 kg)     Health Maintenance Due  Topic Date Due  . Hepatitis C Screening  Never done  . PNEUMOCOCCAL POLYSACCHARIDE VACCINE AGE 70-64 HIGH RISK  Never done  . FOOT EXAM  Never done  . OPHTHALMOLOGY EXAM  Never done  . COVID-19 Vaccine (1) Never done  . HIV Screening  Never done  . COLONOSCOPY  Never done  . INFLUENZA VACCINE  Never done     No results found for: TSH Lab Results  Component Value Date   WBC 7.2 08/04/2020   HGB 13.3 08/04/2020   HCT 39.3 08/04/2020   MCV 85 08/04/2020   PLT 253 08/04/2020   Lab Results  Component Value Date   NA 138 08/04/2020   K 3.1 (L) 08/04/2020   CO2 26 08/04/2020   GLUCOSE 103 (H) 08/04/2020   BUN 23 08/04/2020   CREATININE 1.55 (H) 08/04/2020   BILITOT 0.5 04/27/2020   ALKPHOS 62 04/27/2020   AST 21 04/27/2020   ALT 19 04/27/2020   PROT 6.6 04/27/2020   ALBUMIN 4.5 08/04/2020   CALCIUM 9.5 08/04/2020   ANIONGAP 9 01/17/2020   Lab Results  Component Value Date   CHOL 170 04/27/2020   Lab Results  Component Value Date   HDL 50 04/27/2020   Lab Results  Component Value Date   LDLCALC 101 (H) 04/27/2020   Lab Results  Component Value Date    TRIG 107 04/27/2020   Lab Results  Component Value Date   CHOLHDL 3.4 04/27/2020   Lab Results  Component Value Date   HGBA1C 5.9 (A) 08/04/2020      Assessment & Plan:  1. Low back pain with radiation; 4. Numbness and tingling in both hands Patient reports episodes of low back pain at which time pain to the left upper leg/thigh.  He denies any prior injury back injury.  The pain comes and goes and he does not have the pain at today's visit.  Patient has been asked to obtain an x-ray of the lumbar spine to look for any degenerative changes or reduced disc height which might indicate possible lumbar radiculopathy as a cause of his pain.  Patient has known chronic kidney disease and therefore he is aware that he should avoid the use of nonsteroidal anti-inflammatories that he may take Tylenol/acetaminophen as needed for pain.  If he has increased issues with back and leg pain, he is to return to clinic sooner or notify the office so that further follow-up can be arranged.  He also reports some numbness and tingling in both hands but on examination the patient with  Negative Tinel and Phalen and no epicondylitis findings.  He has had mild increase in hemoglobin A1c of 5.9 but this is likely not contributing to his symptoms.  If he continues to have issues with numbness and tingling in his fingertips, he may need EMG testing to  determine the cause. - DG Lumbar Spine Complete; Future  2. Screening for prostate cancer Discussed prostate exam to detect abnormalities of the prostate as well as PSA blood test.  Patient declined rectal exam but would like to have PSA as a screening test for prostate cancer. - PSA  3. Screening for colon cancer Discussed options for colon cancer screening including colonoscopy, Cologuard and yearly fecal occult blood testing.  Patient ultimately agreed to have colonoscopy and GI referral placed for his screening colonoscopy. - Ambulatory referral to  Gastroenterology  *Approximately 15 minutes was spent at today's visit discussing cancer screening for men and patient was given educational handouts as part of after visit summary regarding cancer screening as well as specific information regarding screening for colon cancer.   Follow-up: Return in about 3 months (around 12/19/2020) for chronic issues and as needed.   Antony Blackbird, MD

## 2020-09-19 LAB — PSA: Prostate Specific Ag, Serum: 0.8 ng/mL (ref 0.0–4.0)

## 2020-10-06 ENCOUNTER — Other Ambulatory Visit: Payer: Self-pay | Admitting: Family Medicine

## 2020-10-06 DIAGNOSIS — I10 Essential (primary) hypertension: Secondary | ICD-10-CM

## 2020-11-22 ENCOUNTER — Ambulatory Visit (HOSPITAL_COMMUNITY)
Admission: EM | Admit: 2020-11-22 | Discharge: 2020-11-22 | Disposition: A | Discharge status: Home / Self Care | Payer: BC Managed Care – PPO | Attending: Emergency Medicine | Admitting: Emergency Medicine

## 2020-11-22 ENCOUNTER — Encounter (HOSPITAL_COMMUNITY): Payer: Self-pay | Admitting: Emergency Medicine

## 2020-11-22 DIAGNOSIS — Z79899 Other long term (current) drug therapy: Secondary | ICD-10-CM | POA: Diagnosis not present

## 2020-11-22 DIAGNOSIS — N1831 Chronic kidney disease, stage 3a: Secondary | ICD-10-CM | POA: Diagnosis not present

## 2020-11-22 DIAGNOSIS — M549 Dorsalgia, unspecified: Secondary | ICD-10-CM | POA: Diagnosis not present

## 2020-11-22 DIAGNOSIS — U071 COVID-19: Secondary | ICD-10-CM | POA: Insufficient documentation

## 2020-11-22 DIAGNOSIS — R5383 Other fatigue: Secondary | ICD-10-CM | POA: Diagnosis not present

## 2020-11-22 DIAGNOSIS — R059 Cough, unspecified: Secondary | ICD-10-CM | POA: Insufficient documentation

## 2020-11-22 DIAGNOSIS — I129 Hypertensive chronic kidney disease with stage 1 through stage 4 chronic kidney disease, or unspecified chronic kidney disease: Secondary | ICD-10-CM | POA: Diagnosis not present

## 2020-11-22 DIAGNOSIS — Z7984 Long term (current) use of oral hypoglycemic drugs: Secondary | ICD-10-CM | POA: Insufficient documentation

## 2020-11-22 DIAGNOSIS — R52 Pain, unspecified: Secondary | ICD-10-CM | POA: Diagnosis not present

## 2020-11-22 DIAGNOSIS — Z7901 Long term (current) use of anticoagulants: Secondary | ICD-10-CM | POA: Diagnosis not present

## 2020-11-22 DIAGNOSIS — E1122 Type 2 diabetes mellitus with diabetic chronic kidney disease: Secondary | ICD-10-CM | POA: Diagnosis not present

## 2020-11-22 DIAGNOSIS — R109 Unspecified abdominal pain: Secondary | ICD-10-CM | POA: Insufficient documentation

## 2020-11-22 DIAGNOSIS — R6883 Chills (without fever): Secondary | ICD-10-CM | POA: Diagnosis not present

## 2020-11-22 DIAGNOSIS — R197 Diarrhea, unspecified: Secondary | ICD-10-CM | POA: Insufficient documentation

## 2020-11-22 LAB — SARS CORONAVIRUS 2 (TAT 6-24 HRS): SARS Coronavirus 2: POSITIVE — AB

## 2020-11-22 NOTE — Discharge Instructions (Signed)
Self isolate until covid results are back.  We will notify you by phone if it is positive. Your negative results will be sent through your MyChart.    If it is positive you need to isolate from others for a total of 5 days. If no fever for 24 hours without medications, and symptoms improving you may end isolation on day 6, but wear a mask if around any others for an additional 5 days.   Tylenol as needed for pain.  Rest.  If symptoms worsen or do not improve in the next week to return to be seen or to follow up with your PCP.

## 2020-11-22 NOTE — ED Provider Notes (Signed)
[place]    CSN: [id number] Arrival date & time: 11/22/20  1134      History   Chief Complaint Chief Complaint  Patient presents with  . Cough  . Back Pain  . Abdominal Pain  . Chills    HPI Timothy Warren is a 60 y.o. male.   Timothy Warren presents with complaints of body aches, back ache, flank pain, cough, fatigue, cough, diarrhea, which started 1/14. Normal urination. No nausea or vomiting. No blood or black in stool. No shortness of breath. No dizziness. No shortness of breath or chest pain. History of DM and htn.    ROS per HPI, negative if not otherwise mentioned.      Past Medical History:  Diagnosis Date  . Seasonal allergies     Patient Active Problem List   Diagnosis Date Noted  . Type 2 diabetes mellitus without complication, without long-term current use of insulin ([place]) 08/12/2020  . Stage 3a chronic kidney disease ([place]) 08/12/2020  . Erectile dysfunction associated with type 2 diabetes mellitus ([place]) 08/12/2020  . Hypertension 02/17/2020  . Hyperglycemia 02/17/2020    Past Surgical History:  Procedure Laterality Date  . NO PAST SURGERIES         Home Medications    Prior to Admission medications   Medication Sig Start Date End Date Taking? Authorizing Provider  amLODipine (NORVASC) 5 MG tablet Take 1 tablet (5 mg total) by mouth daily. To lower blood pressure 05/10/20   McClung, Angela M, PA-C  Blood Glucose Monitoring Suppl (ONE TOUCH ULTRA 2) w/Device KIT Use to check blood sugar once daily. E11.9 04/24/20   Fulp, Cammie, MD  Boswellia-Glucosamine-Vit D (OSTEO BI-FLEX ONE PER DAY PO) Take 1 tablet by mouth daily.    [provider]  glucose blood (ONETOUCH ULTRA) test strip Use as instructed to check blood sugar once daily. E11.9 04/24/20   Fulp, Cammie, MD  hydrochlorothiazide (HYDRODIURIL) 25 MG tablet TAKE 1 TABLET(25 MG) BY MOUTH DAILY 10/06/20   Fulp, Cammie, MD  Lancets (ONETOUCH ULTRASOFT) lancets Use as instructed  to check blood sugar once daily. E11.9 04/24/20   Fulp, Ander Gaster, MD  metFORMIN (GLUCOPHAGE) 500 MG tablet Take 1 tablet (500 mg total) by mouth 2 (two) times daily with a meal. 05/10/20   McClung, Dionne Bucy, PA-C  Multiple Vitamin (MULTIVITAMIN WITH MINERALS) TABS tablet Take 1 tablet by mouth daily.    [provider]  sildenafil (VIAGRA) 100 MG tablet Take 1 tablet (100 mg total) by mouth daily as needed for erectile dysfunction. 08/08/20   Antony Blackbird, MD    Family History Family History  Problem Relation Age of Onset  . Hypertension Mother   . Hypertension Father     Social History Social History   Tobacco Use  . Smoking status: Never Smoker  . Smokeless tobacco: Never Used  Vaping Use  . Vaping Use: Never used  Substance Use Topics  . Alcohol use: Yes    Alcohol/week: 1.0 standard drink    Types: 1 Cans of beer per week  . Drug use: Yes    Types: Marijuana     Allergies   Patient has no known allergies.   Review of Systems Review of Systems   Physical Exam Triage Vital Signs ED Triage Vitals  Enc Vitals Group     BP 11/22/20 1322 131/89     Pulse Rate 11/22/20 1322 72     Resp 11/22/20 1322 17  Temp 11/22/20 1322 98.2 F (36.8 C)     Temp Source 11/22/20 1322 Oral     SpO2 11/22/20 1322 100 %     Weight --      Height --      Head Circumference --      Peak Flow --      Pain Score 11/22/20 1318 7     Pain Loc --      Pain Edu? --      Excl. in [place]? --    No data found.  Updated Vital Signs BP 131/89 (BP Location: Right Arm)   Pulse 72   Temp 98.2 F (36.8 C) (Oral)   Resp 17   SpO2 100%   Visual Acuity Right Eye Distance:   Left Eye Distance:   Bilateral Distance:    Right Eye Near:   Left Eye Near:    Bilateral Near:     Physical Exam Constitutional:      Appearance: He is well-developed.  Cardiovascular:     Rate and Rhythm: Normal rate.  Pulmonary:     Effort: Pulmonary effort is normal.  Abdominal:     Tenderness:  There is no abdominal tenderness. There is no right CVA tenderness, left CVA tenderness, guarding or rebound.  Musculoskeletal:     Thoracic back: Normal.     Lumbar back: Normal.  Skin:    General: Skin is warm and dry.  Neurological:     Mental Status: He is alert and oriented to person, place, and time.      UC Treatments / Results  Labs (all labs ordered are listed, but only abnormal results are displayed) Labs Reviewed  SARS CORONAVIRUS 2 (TAT 6-24 HRS)    EKG   Radiology No results found.  Procedures Procedures (including critical care time)  Medications Ordered in UC Medications - No data to display  Initial Impression / Assessment and Plan / UC Course  I have reviewed the triage vital signs and the nursing notes.  Pertinent labs & imaging results that were available during my care of the patient were reviewed by me and considered in my medical decision making (see chart for details).     Non toxic. Benign physical exam. Vitals stable. Soft non tender abdomen and back. History and physical consistent with viral illness.  Covid testing pending and isolation instructions provided. Supportive cares recommended. Return precautions provided. Patient verbalized understanding and agreeable to plan.  Ambulatory out of clinic without difficulty.    Final Clinical Impressions(s) / UC Diagnoses   Final diagnoses:  Body aches  Cough     Discharge Instructions     Self isolate until covid results are back.  We will notify you by phone if it is positive. Your negative results will be sent through your MyChart.    If it is positive you need to isolate from others for a total of 5 days. If no fever for 24 hours without medications, and symptoms improving you may end isolation on day 6, but wear a mask if around any others for an additional 5 days.   Tylenol as needed for pain.  Rest.  If symptoms worsen or do not improve in the next week to return to be seen or to follow  up with your PCP.      ED Prescriptions    None     PDMP not reviewed this encounter.   Zigmund Gottron, NP 11/22/20 1402

## 2020-11-22 NOTE — ED Triage Notes (Signed)
Pt presents with cough, back and abdominal pain, and chills xs 5 days.

## 2020-12-07 ENCOUNTER — Ambulatory Visit: Payer: BC Managed Care – PPO | Admitting: Physician Assistant

## 2020-12-07 ENCOUNTER — Other Ambulatory Visit: Payer: Self-pay

## 2020-12-07 ENCOUNTER — Ambulatory Visit: Payer: 59 | Admitting: Family Medicine

## 2020-12-13 ENCOUNTER — Ambulatory Visit (AMBULATORY_SURGERY_CENTER): Payer: Self-pay

## 2020-12-13 ENCOUNTER — Other Ambulatory Visit: Payer: Self-pay

## 2020-12-13 VITALS — Ht 70.0 in | Wt 171.0 lb

## 2020-12-13 DIAGNOSIS — Z1211 Encounter for screening for malignant neoplasm of colon: Secondary | ICD-10-CM

## 2020-12-13 MED ORDER — NA SULFATE-K SULFATE-MG SULF 17.5-3.13-1.6 GM/177ML PO SOLN: 1.0000 | Freq: Once | ORAL | 0 refills | Status: AC

## 2020-12-13 NOTE — Progress Notes (Signed)
No allergies to soy or egg Pt is not on blood thinners or diet pills Has no hx of sedation/intubation Denies atrial flutter/fib Denies constipation   Emmi instructions given to pt  Pt is aware of Covid safety and care partner requirements.   States had Covid 1-2 months ago, but unable to tell dates  Has had #1 vaccine for Covid in January 2022.  Is scheduled for # 2 on 12/23/20.  Pt instructed to call if he does not receive it so that he can be scheduled for a Covid test.

## 2020-12-18 ENCOUNTER — Telehealth: Payer: Self-pay | Admitting: Gastroenterology

## 2020-12-18 DIAGNOSIS — Z1211 Encounter for screening for malignant neoplasm of colon: Secondary | ICD-10-CM

## 2020-12-18 MED ORDER — PEG 3350-KCL-NA BICARB-NACL 420 G PO SOLR: 4000.0000 mL | Freq: Once | ORAL | 0 refills | Status: AC

## 2020-12-18 NOTE — Telephone Encounter (Signed)
Pt states he needs a preauthorization for his SUPREP.

## 2020-12-18 NOTE — Telephone Encounter (Signed)
We do not do any PA's for preps. Pt notified. Patient unable to afford suprep. Switched to [employer] prep. New prep instructions mailed to pt and new RX sent to [facility]. Pt is aware.

## 2020-12-20 ENCOUNTER — Ambulatory Visit: Payer: 59 | Admitting: Family Medicine

## 2020-12-27 ENCOUNTER — Encounter: Payer: 59 | Admitting: Gastroenterology

## 2020-12-27 ENCOUNTER — Telehealth: Payer: Self-pay | Admitting: Gastroenterology

## 2020-12-27 NOTE — Telephone Encounter (Signed)
Ok

## 2021-01-30 ENCOUNTER — Ambulatory Visit: Payer: BC Managed Care – PPO | Admitting: Critical Care Medicine

## 2021-01-30 NOTE — Progress Notes (Deleted)
   Subjective:    Patient ID: Timothy Warren, male    DOB: Nov 11, 1960, 60 y.o.   MRN: [id number]  59 y.o.M   Here to est pcp  Former Fulp patient  01/30/2021 Hx HTN, T2DM, CKD  Last saw Fulp 09/2020: Timothy Warren, 60 year old male, who presents today secondary to complaint of a few months of recurrent episodes of pain in the left low back and when he had the left low back pain he often has shooting pain in his left thigh.  Pain episodes are currently mild and usually a 5 or less on a 0-to-10 scale.  He has noticed that occasionally he will also have sensation that his left knee will suddenly give way.  He denies any prior back injury or injury to the hip or leg.        He has also noticed for a few years that he will have occasional numbness in both hands.  He is not awakened at night with numbness or tingling of his hands.  He did notice in the past that when he worked out more heavily/lifting heavy weights, that he would have increased pain at his elbows but he has not been doing any heavy weight lifting recently.         He has been thinking about cancer screening since this was discussed in his last visit and he would like more information on screening for prostate cancer as well as screening for colon cancer.  Low back pain with radiation; 4. Numbness and tingling in both hands Patient reports episodes of low back pain at which time pain to the left upper leg/thigh.  He denies any prior injury back injury.  The pain comes and goes and he does not have the pain at today's visit.  Patient has been asked to obtain an x-ray of the lumbar spine to look for any degenerative changes or reduced disc height which might indicate possible lumbar radiculopathy as a cause of his pain.  Patient has known chronic kidney disease and therefore he is aware that he should avoid the use of nonsteroidal anti-inflammatories that he may take Tylenol/acetaminophen as needed for pain.  If he has increased issues with back  and leg pain, he is to return to clinic sooner or notify the office so that further follow-up can be arranged.  He also reports some numbness and tingling in both hands but on examination the patient with  Negative Tinel and Phalen and no epicondylitis findings.  He has had mild increase in hemoglobin A1c of 5.9 but this is likely not contributing to his symptoms.  If he continues to have issues with numbness and tingling in his fingertips, he may need EMG testing to determine the cause. - DG Lumbar Spine Complete; Future  2. Screening for prostate cancer Discussed prostate exam to detect abnormalities of the prostate as well as PSA blood test.  Patient declined rectal exam but would like to have PSA as a screening test for prostate cancer. - PSA  3. Screening for colon cancer Discussed options for colon cancer screening including colonoscopy, Cologuard and yearly fecal occult blood testing.  Patient ultimately agreed to have colonoscopy and GI referral placed for his screening colonoscopy. - Ambulatory referral to Gastroenterology     Review of Systems     Objective:   Physical Exam        Assessment & Plan:

## 2021-03-18 NOTE — Progress Notes (Signed)
Subjective:    Patient ID: Timothy Warren, male    DOB: [birth year], 60 y.o.   MRN: [id number]  59 y.o.M here to re est PCP  Former Fulp patient last seen 09/2020 03/19/21 This patient is here to reestablish for primary care.  At the last visit in November the patient had a referral for colonoscopy but this was not achieved because he was in between insurance plans.  He does have type 2 diabetes and hypertension arrival blood pressure 134/79.  He is not been checking his blood sugars.  He is due an A1c.  He does complain of some minimal back pain when walking but it is actually improved from the last visit.  He never had x-rays taken of the back.  The patient does need an ophthalmology exam.  There are no other complaints.  Note at the last visit prostate cancer screen was negative.  Patient has received 2 COVID vaccinations this year.  Patient was seen by nephrology in November of last year and they are following him.  Patient does maintain amlodipine and hydrochlorothiazide for blood pressure control.  Last renal function November showed a creatinine of 1.48 and a microalbumin showing no significant protein in the urine.  Past Medical History:  Diagnosis Date  . Allergy   . Chronic kidney disease    stage 2 CKD  . Diabetes mellitus without complication ([place])   . Hypertension   . Seasonal allergies      Family History  Problem Relation Age of Onset  . Hypertension Mother   . Hypertension Father   . Colon cancer Neg Hx   . Colon polyps Neg Hx   . Esophageal cancer Neg Hx   . Rectal cancer Neg Hx   . Stomach cancer Neg Hx      Social History   Socioeconomic History  . Marital status: Single    Spouse name: Not on file  . Number of children: Not on file  . Years of education: Not on file  . Highest education level: Not on file  Occupational History  . Not on file  Tobacco Use  . Smoking status: Never Smoker  . Smokeless tobacco: Never Used  Vaping Use  . Vaping Use:  Never used  Substance and Sexual Activity  . Alcohol use: Not Currently    Alcohol/week: 1.0 standard drink    Types: 1 Cans of beer per week  . Drug use: Yes    Types: Marijuana    Comment: Daily  . Sexual activity: Not on file  Other Topics Concern  . Not on file  Social History Narrative  . Not on file   Social Determinants of Health   Financial Resource Strain: Not on file  Food Insecurity: Not on file  Transportation Needs: Not on file  Physical Activity: Not on file  Stress: Not on file  Social Connections: Not on file  Intimate Partner Violence: Not on file     No Known Allergies   Outpatient Medications Prior to Visit  Medication Sig Dispense Refill  . amLODipine (NORVASC) 5 MG tablet Take 1 tablet (5 mg total) by mouth daily. To lower blood pressure 30 tablet 3  . Blood Glucose Monitoring Suppl (ONE TOUCH ULTRA 2) w/Device KIT Use to check blood sugar once daily. E11.9 1 kit 0  . Boswellia-Glucosamine-Vit D (OSTEO BI-FLEX ONE PER DAY PO) Take 1 tablet by mouth daily.    . hydrochlorothiazide (HYDRODIURIL) 25 MG tablet TAKE 1 TABLET(25 MG) BY  MOUTH DAILY 90 tablet 0  . Multiple Vitamin (MULTIVITAMIN WITH MINERALS) TABS tablet Take 1 tablet by mouth daily.    Marland Kitchen glucose blood (ONETOUCH ULTRA) test strip Use as instructed to check blood sugar once daily. E11.9 100 each 2  . Lancets (ONETOUCH ULTRASOFT) lancets Use as instructed to check blood sugar once daily. E11.9 100 each 2  . metFORMIN (GLUCOPHAGE) 500 MG tablet Take 1 tablet (500 mg total) by mouth 2 (two) times daily with a meal. 180 tablet 3  . sildenafil (VIAGRA) 100 MG tablet Take 1 tablet (100 mg total) by mouth daily as needed for erectile dysfunction. 10 tablet 5   No facility-administered medications prior to visit.      Review of Systems  Constitutional: Negative.   HENT: Negative.   Respiratory: Negative.   Cardiovascular: Negative.   Gastrointestinal: Negative.   Genitourinary: Negative.    Musculoskeletal: Positive for back pain.  Skin: Negative.   Neurological: Negative.   Psychiatric/Behavioral: Negative.        Objective:   Physical Exam   Vitals:   03/19/21 1043  BP: 132/79  Pulse: 61  Resp: 15  Temp: 97.8 F (36.6 C)  SpO2: 100%  Weight: 165 lb (74.8 kg)  Height: 5' 9.61" (1.768 m)    Gen: Pleasant, well-nourished, in no distress,  normal affect  ENT: No lesions,  mouth clear,  oropharynx clear, no postnasal drip  Neck: No JVD, no TMG, no carotid bruits  Lungs: No use of accessory muscles, no dullness to percussion, clear without rales or rhonchi  Cardiovascular: RRR, heart sounds normal, no murmur or gallops, no peripheral edema  Abdomen: soft and NT, no HSM,  BS normal  Musculoskeletal: No deformities, no cyanosis or clubbing  Neuro: alert, non focal  Skin: Warm, no lesions or rashes  Foot exam was normal except for slight decreased sensation at the lateral aspects of both feet       Assessment & Plan:  I personally reviewed all images and lab data in the [facility] system as well as any outside material available during this office visit and agree with the  radiology impressions.   Hypertension Blood pressure at goal  Continue amlodipine and hydrochlorothiazide  Recheck renal function  Type 2 diabetes mellitus without complication, without long-term current use of insulin ([facility]) Patient out of testing supplies will recheck A1c this visit recheck lipids continue metformin as prescribed  Erectile dysfunction associated with type 2 diabetes mellitus ([place]) Plan to refill Viagra  Stage 3a chronic kidney disease ([place]) Stage III CKD follow-up per nephrology   Ladarrius was seen today for diabetes and hypertension.  Diagnoses and all orders for this visit:  Colon cancer screening -     Ambulatory referral to Gastroenterology  Type 2 diabetes mellitus without complication, without long-term current use of insulin ([place]) -     Hemoglobin  A1c -     Comprehensive metabolic panel -     glucose blood (ONETOUCH ULTRA) test strip; Use as instructed to check blood sugar once daily. E11.9 -     Lancets (ONETOUCH ULTRASOFT) lancets; Use as instructed to check blood sugar once daily. E11.9 -     metFORMIN (GLUCOPHAGE) 500 MG tablet; Take 1 tablet (500 mg total) by mouth 2 (two) times daily with a meal. -     Microalbumin / creatinine urine ratio -     Lipid panel  Erectile dysfunction associated with type 2 diabetes mellitus ([place]) -     Discontinue:  sildenafil (VIAGRA) 100 MG tablet; Take 1 tablet (100 mg total) by mouth daily as needed for erectile dysfunction. -     sildenafil (VIAGRA) 100 MG tablet; Take 1 tablet (100 mg total) by mouth daily as needed for erectile dysfunction.  Encounter for screening for HIV -     HIV Antibody (routine testing w rflx)  Need for hepatitis C screening test -     HCV Ab w Reflex to Quant PCR  Primary hypertension -     CBC with Differential/Platelet  Need for pneumococcal vaccine -     Pneumococcal conjugate vaccine 20-valent (Prevnar 20)  Stage 3a chronic kidney disease ([place])  Other orders -     atorvastatin (LIPITOR) 20 MG tablet; Take 1 tablet (20 mg total) by mouth daily.  Patient agreed to Pneumovax I spent 36 minutes reviewing renal records from nephrology reviewing old chart old data composing complex medical decision making plan interviewing and examining the patient

## 2021-03-19 ENCOUNTER — Other Ambulatory Visit: Payer: Self-pay

## 2021-03-19 ENCOUNTER — Ambulatory Visit: Payer: 59 | Attending: Critical Care Medicine | Admitting: Critical Care Medicine

## 2021-03-19 ENCOUNTER — Encounter: Payer: Self-pay | Admitting: Critical Care Medicine

## 2021-03-19 VITALS — BP 132/79 | HR 61 | Temp 97.8°F | Resp 15 | Ht 69.61 in | Wt 165.0 lb

## 2021-03-19 DIAGNOSIS — N521 Erectile dysfunction due to diseases classified elsewhere: Secondary | ICD-10-CM

## 2021-03-19 DIAGNOSIS — I10 Essential (primary) hypertension: Secondary | ICD-10-CM | POA: Diagnosis not present

## 2021-03-19 DIAGNOSIS — E1169 Type 2 diabetes mellitus with other specified complication: Secondary | ICD-10-CM | POA: Diagnosis not present

## 2021-03-19 DIAGNOSIS — N1831 Chronic kidney disease, stage 3a: Secondary | ICD-10-CM | POA: Diagnosis not present

## 2021-03-19 DIAGNOSIS — Z114 Encounter for screening for human immunodeficiency virus [HIV]: Secondary | ICD-10-CM

## 2021-03-19 DIAGNOSIS — Z1211 Encounter for screening for malignant neoplasm of colon: Secondary | ICD-10-CM

## 2021-03-19 DIAGNOSIS — Z1159 Encounter for screening for other viral diseases: Secondary | ICD-10-CM

## 2021-03-19 DIAGNOSIS — E119 Type 2 diabetes mellitus without complications: Secondary | ICD-10-CM | POA: Diagnosis not present

## 2021-03-19 DIAGNOSIS — Z23 Encounter for immunization: Secondary | ICD-10-CM | POA: Diagnosis not present

## 2021-03-19 MED ORDER — ONETOUCH ULTRA VI STRP: ORAL_STRIP | 2 refills | Status: DC

## 2021-03-19 MED ORDER — SILDENAFIL CITRATE 100 MG PO TABS
100.0000 mg | ORAL_TABLET | Freq: Every day | ORAL | 5 refills | Status: DC | PRN
Start: 2021-03-19 — End: 2021-05-28

## 2021-03-19 MED ORDER — ATORVASTATIN CALCIUM 20 MG PO TABS: 20.0000 mg | ORAL_TABLET | Freq: Every day | ORAL | 3 refills | Status: DC

## 2021-03-19 MED ORDER — SILDENAFIL CITRATE 100 MG PO TABS: 100.0000 mg | ORAL_TABLET | Freq: Every day | ORAL | 5 refills | Status: DC | PRN

## 2021-03-19 MED ORDER — METFORMIN HCL 500 MG PO TABS: 500.0000 mg | ORAL_TABLET | Freq: Two times a day (BID) | ORAL | 3 refills | Status: DC

## 2021-03-19 MED ORDER — ONETOUCH ULTRASOFT LANCETS MISC: 2 refills | Status: DC

## 2021-03-19 NOTE — Assessment & Plan Note (Signed)
Stage III CKD follow-up per nephrology

## 2021-03-19 NOTE — Assessment & Plan Note (Signed)
Patient out of testing supplies will recheck A1c this visit recheck lipids continue metformin as prescribed

## 2021-03-19 NOTE — Patient Instructions (Addendum)
Refills on your metformin blood pressure medicine amlodipine hydrochlorothiazideViagra were all sent to your [facility] pharmacy  We are starting you on atorvastatin 20 mg daily for cholesterol  Cholesterol panel and other complete sets of blood work obtained today including A1c and a urine sample for protein in the urine  A pneumococcal vaccine was given today  Please obtain an eye exam and a dental exam we gave you resources to choose from you now have insurance please get this done  Referral for colonoscopy was sent  Return to see Dr. Joya Gaskins in 6 weeks  We also have you see Lurena Joiner our clinical pharmacist in 2 weeks for blood pressure and diabetes rechecks  Follow healthy diet as outlined below for your blood pressure and diabetes   Diabetes Mellitus and Nutrition, Adult When you have diabetes, or diabetes mellitus, it is very important to have healthy eating habits because your blood sugar (glucose) levels are greatly affected by what you eat and drink. Eating healthy foods in the right amounts, at about the same times every day, can help you:  Control your blood glucose.  Lower your risk of heart disease.  Improve your blood pressure.  Reach or maintain a healthy weight. What can affect my meal plan? Every person with diabetes is different, and each person has different needs for a meal plan. Your health care provider may recommend that you work with a dietitian to make a meal plan that is best for you. Your meal plan may vary depending on factors such as:  The calories you need.  The medicines you take.  Your weight.  Your blood glucose, blood pressure, and cholesterol levels.  Your activity level.  Other health conditions you have, such as heart or kidney disease. How do carbohydrates affect me? Carbohydrates, also called carbs, affect your blood glucose level more than any other type of food. Eating carbs naturally raises the amount of glucose in your blood. Carb  counting is a method for keeping track of how many carbs you eat. Counting carbs is important to keep your blood glucose at a healthy level, especially if you use insulin or take certain oral diabetes medicines. It is important to know how many carbs you can safely have in each meal. This is different for every person. Your dietitian can help you calculate how many carbs you should have at each meal and for each snack. How does alcohol affect me? Alcohol can cause a sudden decrease in blood glucose (hypoglycemia), especially if you use insulin or take certain oral diabetes medicines. Hypoglycemia can be a life-threatening condition. Symptoms of hypoglycemia, such as sleepiness, dizziness, and confusion, are similar to symptoms of having too much alcohol.  Do not drink alcohol if: ? Your health care provider tells you not to drink. ? You are pregnant, may be pregnant, or are planning to become pregnant.  If you drink alcohol: ? Do not drink on an empty stomach. ? Limit how much you use to:  0-1 drink a day for women.  0-2 drinks a day for men. ? Be aware of how much alcohol is in your drink. In the [place] one drink equals one 12 oz bottle of beer (355 mL), one 5 oz glass of wine (148 mL), or one 1 oz glass of hard liquor (44 mL). ? Keep yourself hydrated with water, diet soda, or unsweetened iced tea.  Keep in mind that regular soda, juice, and other mixers may contain a lot of sugar and must be  counted as carbs. What are tips for following this plan? Reading food labels  Start by checking the serving size on the "Nutrition Facts" label of packaged foods and drinks. The amount of calories, carbs, fats, and other nutrients listed on the label is based on one serving of the item. Many items contain more than one serving per package.  Check the total grams (g) of carbs in one serving. You can calculate the number of servings of carbs in one serving by dividing the total carbs by 15. For example,  if a food has 30 g of total carbs per serving, it would be equal to 2 servings of carbs.  Check the number of grams (g) of saturated fats and trans fats in one serving. Choose foods that have a low amount or none of these fats.  Check the number of milligrams (mg) of salt (sodium) in one serving. Most people should limit total sodium intake to less than 2,300 mg per day.  Always check the nutrition information of foods labeled as "low-fat" or "nonfat." These foods may be higher in added sugar or refined carbs and should be avoided.  Talk to your dietitian to identify your daily goals for nutrients listed on the label. Shopping  Avoid buying canned, pre-made, or processed foods. These foods tend to be high in fat, sodium, and added sugar.  Shop around the outside edge of the grocery store. This is where you will most often find fresh fruits and vegetables, bulk grains, fresh meats, and fresh dairy. Cooking  Use low-heat cooking methods, such as baking, instead of high-heat cooking methods like deep frying.  Cook using healthy oils, such as olive, canola, or sunflower oil.  Avoid cooking with butter, cream, or high-fat meats. Meal planning  Eat meals and snacks regularly, preferably at the same times every day. Avoid going long periods of time without eating.  Eat foods that are high in fiber, such as fresh fruits, vegetables, beans, and whole grains. Talk with your dietitian about how many servings of carbs you can eat at each meal.  Eat 4-6 oz (112-168 g) of lean protein each day, such as lean meat, chicken, fish, eggs, or tofu. One ounce (oz) of lean protein is equal to: ? 1 oz (28 g) of meat, chicken, or fish. ? 1 egg. ?  cup (62 g) of tofu.  Eat some foods each day that contain healthy fats, such as avocado, nuts, seeds, and fish.   What foods should I eat? Fruits Berries. Apples. Oranges. Peaches. Apricots. Plums. Grapes. Mango. Papaya. Pomegranate. Kiwi.  Cherries. Vegetables Lettuce. Spinach. Leafy greens, including kale, chard, collard greens, and mustard greens. Beets. Cauliflower. Cabbage. Broccoli. Carrots. Green beans. Tomatoes. Peppers. Onions. Cucumbers. Brussels sprouts. Grains Whole grains, such as whole-wheat or whole-grain bread, crackers, tortillas, cereal, and pasta. Unsweetened oatmeal. Quinoa. Brown or wild rice. Meats and other proteins Seafood. Poultry without skin. Lean cuts of poultry and beef. Tofu. Nuts. Seeds. Dairy Low-fat or fat-free dairy products such as milk, yogurt, and cheese. The items listed above may not be a complete list of foods and beverages you can eat. Contact a dietitian for more information. What foods should I avoid? Fruits Fruits canned with syrup. Vegetables Canned vegetables. Frozen vegetables with butter or cream sauce. Grains Refined white flour and flour products such as bread, pasta, snack foods, and cereals. Avoid all processed foods. Meats and other proteins Fatty cuts of meat. Poultry with skin. Breaded or fried meats. Processed meat. Avoid saturated  fats. Dairy Full-fat yogurt, cheese, or milk. Beverages Sweetened drinks, such as soda or iced tea. The items listed above may not be a complete list of foods and beverages you should avoid. Contact a dietitian for more information. Questions to ask a health care provider  Do I need to meet with a diabetes educator?  Do I need to meet with a dietitian?  What number can I call if I have questions?  When are the best times to check my blood glucose? Where to find more information:  American Diabetes Association: diabetes.org  Academy of Nutrition and Dietetics: www.eatright.[employer] of Diabetes and Digestive and Kidney Diseases: [employer]  Association of Diabetes Care and Education Specialists: www.diabeteseducator.org Summary  It is important to have healthy eating habits because your blood sugar  (glucose) levels are greatly affected by what you eat and drink.  A healthy meal plan will help you control your blood glucose and maintain a healthy lifestyle.  Your health care provider may recommend that you work with a dietitian to make a meal plan that is best for you.  Keep in mind that carbohydrates (carbs) and alcohol have immediate effects on your blood glucose levels. It is important to count carbs and to use alcohol carefully. This information is not intended to replace advice given to you by your health care provider. Make sure you discuss any questions you have with your health care provider. Document Revised: 09/28/2019 Document Reviewed: 09/28/2019 Elsevier Patient Education  2021 [employer].

## 2021-03-19 NOTE — Assessment & Plan Note (Signed)
Plan to refill Viagra

## 2021-03-19 NOTE — Assessment & Plan Note (Signed)
Blood pressure at goal  Continue amlodipine and hydrochlorothiazide  Recheck renal function

## 2021-03-19 NOTE — Progress Notes (Signed)
HTN and Diabetes Pt needs med sent into [facility] on [address]

## 2021-03-20 LAB — COMPREHENSIVE METABOLIC PANEL
ALT: 13 IU/L (ref 0–44)
AST: 17 IU/L (ref 0–40)
Albumin/Globulin Ratio: 1.6 (ref 1.2–2.2)
Albumin: 4.6 g/dL (ref 3.8–4.9)
Alkaline Phosphatase: 58 IU/L (ref 44–121)
BUN/Creatinine Ratio: 14 (ref 9–20)
BUN: 25 mg/dL — ABNORMAL HIGH (ref 6–24)
Bilirubin Total: 0.3 mg/dL (ref 0.0–1.2)
CO2: 25 mmol/L (ref 20–29)
Calcium: 9.8 mg/dL (ref 8.7–10.2)
Chloride: 97 mmol/L (ref 96–106)
Creatinine, Ser: 1.82 mg/dL — ABNORMAL HIGH (ref 0.76–1.27)
Globulin, Total: 2.8 g/dL (ref 1.5–4.5)
Glucose: 121 mg/dL — ABNORMAL HIGH (ref 65–99)
Potassium: 3.7 mmol/L (ref 3.5–5.2)
Sodium: 139 mmol/L (ref 134–144)
Total Protein: 7.4 g/dL (ref 6.0–8.5)
eGFR: 43 mL/min/{1.73_m2} — ABNORMAL LOW (ref >59)

## 2021-03-20 LAB — MICROALBUMIN / CREATININE URINE RATIO
Creatinine, Urine: 136.1 mg/dL
Microalb/Creat Ratio: <2 mg/g creat (ref 0–29)
Microalbumin, Urine: <3 ug/mL

## 2021-03-20 LAB — HCV INTERPRETATION

## 2021-03-20 LAB — CBC WITH DIFFERENTIAL/PLATELET
Basophils Absolute: 0.1 10*3/uL (ref 0.0–0.2)
Basos: 1 %
EOS (ABSOLUTE): 0.5 10*3/uL — ABNORMAL HIGH (ref 0.0–0.4)
Eos: 7 %
Hematocrit: 44.7 % (ref 37.5–51.0)
Hemoglobin: 14 g/dL (ref 13.0–17.7)
Immature Grans (Abs): 0 10*3/uL (ref 0.0–0.1)
Immature Granulocytes: 0 %
Lymphocytes Absolute: 2.2 10*3/uL (ref 0.7–3.1)
Lymphs: 29 %
MCH: 27.9 pg (ref 26.6–33.0)
MCHC: 31.3 g/dL — ABNORMAL LOW (ref 31.5–35.7)
MCV: 89 fL (ref 79–97)
Monocytes Absolute: 0.6 10*3/uL (ref 0.1–0.9)
Monocytes: 8 %
Neutrophils Absolute: 4 10*3/uL (ref 1.4–7.0)
Neutrophils: 55 %
Platelets: 276 10*3/uL (ref 150–450)
RBC: 5.01 x10E6/uL (ref 4.14–5.80)
RDW: 14.2 % (ref 11.6–15.4)
WBC: 7.3 10*3/uL (ref 3.4–10.8)

## 2021-03-20 LAB — LIPID PANEL
Chol/HDL Ratio: 3.2 ratio (ref 0.0–5.0)
Cholesterol, Total: 195 mg/dL (ref 100–199)
HDL: 61 mg/dL (ref >39)
LDL Chol Calc (NIH): 94 mg/dL (ref 0–99)
Triglycerides: 239 mg/dL — ABNORMAL HIGH (ref 0–149)
VLDL Cholesterol Cal: 40 mg/dL (ref 5–40)

## 2021-03-20 LAB — HCV AB W REFLEX TO QUANT PCR: HCV Ab: <0.1 s/co ratio (ref 0.0–0.9)

## 2021-03-20 LAB — HIV ANTIBODY (ROUTINE TESTING W REFLEX): HIV Screen 4th Generation wRfx: NONREACTIVE

## 2021-03-20 LAB — HEMOGLOBIN A1C
Est. average glucose Bld gHb Est-mCnc: 137 mg/dL
Hgb A1c MFr Bld: 6.4 % — ABNORMAL HIGH (ref 4.8–5.6)

## 2021-03-21 ENCOUNTER — Telehealth: Payer: Self-pay | Admitting: Critical Care Medicine

## 2021-03-21 NOTE — Telephone Encounter (Signed)
Copied from [place] [id number]. Topic: General - Other >> Mar 20, 2021  4:35 PM Pawlus, Brayton Layman A wrote: Reason for CRM: Pt wanted to go over his recent lab results,  pt stated he does not use his MyChart. Please call back.

## 2021-03-23 ENCOUNTER — Telehealth: Payer: Self-pay | Admitting: Critical Care Medicine

## 2021-03-23 NOTE — Telephone Encounter (Signed)
Returned pt call and lvm

## 2021-03-23 NOTE — Telephone Encounter (Signed)
Pt called returning nurse phone. Please follow up with pt regarding issue.

## 2021-03-30 NOTE — Telephone Encounter (Signed)
Returned pt call pt didn't answer lvm  

## 2021-04-21 ENCOUNTER — Other Ambulatory Visit: Payer: Self-pay | Admitting: Physician Assistant

## 2021-04-21 DIAGNOSIS — E119 Type 2 diabetes mellitus without complications: Secondary | ICD-10-CM

## 2021-04-21 NOTE — Telephone Encounter (Signed)
Last RF 03/19/21 #180 3 RF

## 2021-05-01 ENCOUNTER — Ambulatory Visit: Payer: 59 | Admitting: Critical Care Medicine

## 2021-05-27 NOTE — Progress Notes (Signed)
Subjective:    Patient ID: Timothy Warren, male    DOB: 15-Feb-1961, 60 y.o.   MRN: [id number]  59 y.o.M here to re est PCP  Former [facility] patient last seen 09/2020 03/19/21 This patient is here to reestablish for primary care.  At the last visit in November the patient had a referral for colonoscopy but this was not achieved because he was in between insurance plans.  He does have type 2 diabetes and hypertension arrival blood pressure 134/79.  He is not been checking his blood sugars.  He is due an A1c.  He does complain of some minimal back pain when walking but it is actually improved from the last visit.  He never had x-rays taken of the back.  The patient does need an ophthalmology exam.  There are no other complaints.  Note at the last visit prostate cancer screen was negative.  Patient has received 2 COVID vaccinations this year.  Patient was seen by nephrology in November of last year and they are following him.  Patient does maintain amlodipine and hydrochlorothiazide for blood pressure control.  Last renal function November showed a creatinine of 1.48 and a microalbumin showing no significant protein in the urine.  05/28/2021 The patient is seen in return follow-up and states since the last visit he did not receive refills on his blood pressure or diabetes medications and is about out of all the medicines.  On arrival blood pressure is 119/71.  He also needs refills on his testing supplies  Past Medical History:  Diagnosis Date   Allergy    Chronic kidney disease    stage 2 CKD   Diabetes mellitus without complication (HCC)    Hypertension    Seasonal allergies      Family History  Problem Relation Age of Onset   Hypertension Mother    Hypertension Father    Colon cancer Neg Hx    Colon polyps Neg Hx    Esophageal cancer Neg Hx    Rectal cancer Neg Hx    Stomach cancer Neg Hx      Social History   Socioeconomic History   Marital status: Single    Spouse name: Not on  file   Number of children: Not on file   Years of education: Not on file   Highest education level: Not on file  Occupational History   Not on file  Tobacco Use   Smoking status: Never   Smokeless tobacco: Never  Vaping Use   Vaping Use: Never used  Substance and Sexual Activity   Alcohol use: Not Currently    Alcohol/week: 1.0 standard drink    Types: 1 Cans of beer per week   Drug use: Yes    Types: Marijuana    Comment: Daily   Sexual activity: Not on file  Other Topics Concern   Not on file  Social History Narrative   Not on file   Social Determinants of Health   Financial Resource Strain: Not on file  Food Insecurity: Not on file  Transportation Needs: Not on file  Physical Activity: Not on file  Stress: Not on file  Social Connections: Not on file  Intimate Partner Violence: Not on file     No Known Allergies   Outpatient Medications Prior to Visit  Medication Sig Dispense Refill   amLODipine (NORVASC) 5 MG tablet Take 1 tablet (5 mg total) by mouth daily. To lower blood pressure 30 tablet 3   glucose blood (  ONETOUCH ULTRA) test strip Use as instructed to check blood sugar once daily. E11.9 100 each 2   hydrochlorothiazide (HYDRODIURIL) 25 MG tablet TAKE 1 TABLET(25 MG) BY MOUTH DAILY 90 tablet 0   metFORMIN (GLUCOPHAGE) 500 MG tablet Take 1 tablet (500 mg total) by mouth 2 (two) times daily with a meal. 180 tablet 3   Blood Glucose Monitoring Suppl (ONE TOUCH ULTRA 2) w/Device KIT Use to check blood sugar once daily. E11.9 1 kit 0   Lancets (ONETOUCH ULTRASOFT) lancets Use as instructed to check blood sugar once daily. E11.9 100 each 2   atorvastatin (LIPITOR) 20 MG tablet Take 1 tablet (20 mg total) by mouth daily. (Patient not taking: Reported on 05/28/2021) 90 tablet 3   Boswellia-Glucosamine-Vit D (OSTEO BI-FLEX ONE PER DAY PO) Take 1 tablet by mouth daily. (Patient not taking: Reported on 05/28/2021)     Multiple Vitamin (MULTIVITAMIN WITH MINERALS) TABS  tablet Take 1 tablet by mouth daily. (Patient not taking: Reported on 05/28/2021)     sildenafil (VIAGRA) 100 MG tablet Take 1 tablet (100 mg total) by mouth daily as needed for erectile dysfunction. 10 tablet 5   No facility-administered medications prior to visit.      Review of Systems  Constitutional: Negative.   HENT: Negative.    Respiratory: Negative.    Cardiovascular: Negative.   Gastrointestinal: Negative.   Genitourinary: Negative.   Musculoskeletal:  Positive for back pain.  Skin: Negative.   Neurological: Negative.   Psychiatric/Behavioral: Negative.        Objective:   Physical Exam   Vitals:   05/28/21 1523 05/28/21 1544  BP: 115/68 115/68  Pulse: 62   Resp: 16   Temp: 98.7 F (37.1 C)   SpO2: 99%   Weight: 168 lb (76.2 kg)   Height: '5\' 9"'  (1.753 m)     Gen: Pleasant, well-nourished, in no distress,  normal affect  ENT: No lesions,  mouth clear,  oropharynx clear, no postnasal drip  Neck: No JVD, no TMG, no carotid bruits  Lungs: No use of accessory muscles, no dullness to percussion, clear Cardiovascular: RRR, heart sounds normal, no murmur or gallops, no peripheral edema  Abdomen: soft and NT, no HSM,  BS normal  Musculoskeletal: No deformities, no cyanosis or clubbing  Neuro: alert, non focal  Skin: Warm, no lesions or rashes      Assessment & Plan:  I personally reviewed all images and lab data in the [facility] system as well as any outside material available during this office visit and agree with the  radiology impressions.   Hypertension Hypertension well controlled at this time  Will refill amlodipine 5 mg daily and hydrochlorothiazide 25 mg daily    Type 2 diabetes mellitus without complication, without long-term current use of insulin ([place]) PatientPlan to add atorvastatin current medication profile refill sent  Metformin refilled  Plan ophthalmology referral   Zyren was seen today for follow-up and medication  refill.  Diagnoses and all orders for this visit:  Colon cancer screening -     Ambulatory referral to Gastroenterology  Essential hypertension -     amLODipine (NORVASC) 5 MG tablet; Take 1 tablet (5 mg total) by mouth daily. To lower blood pressure -     hydrochlorothiazide (HYDRODIURIL) 25 MG tablet; Take one daily  Chronic kidney disease, unspecified CKD stage -     amLODipine (NORVASC) 5 MG tablet; Take 1 tablet (5 mg total) by mouth daily. To lower blood pressure  Type  2 diabetes mellitus without complication, without long-term current use of insulin ([place]) -     Ambulatory referral to Ophthalmology -     glucose blood (ONETOUCH ULTRA) test strip; Use as instructed to check blood sugar once daily. E11.9 -     metFORMIN (GLUCOPHAGE) 500 MG tablet; Take 1 tablet (500 mg total) by mouth 2 (two) times daily with a meal.  Erectile dysfunction associated with type 2 diabetes mellitus (HCC) -     sildenafil (VIAGRA) 100 MG tablet; Take 1 tablet (100 mg total) by mouth daily as needed for erectile dysfunction.  Hypertension, unspecified type  Hyperlipidemia due to type 2 diabetes mellitus ([place])  Other orders -     atorvastatin (LIPITOR) 20 MG tablet; Take 1 tablet (20 mg total) by mouth daily. Patient wants a colonoscopy will make referral

## 2021-05-28 ENCOUNTER — Ambulatory Visit: Payer: 59 | Attending: Critical Care Medicine | Admitting: Critical Care Medicine

## 2021-05-28 ENCOUNTER — Other Ambulatory Visit: Payer: Self-pay

## 2021-05-28 ENCOUNTER — Encounter: Payer: Self-pay | Admitting: Critical Care Medicine

## 2021-05-28 VITALS — BP 115/68 | HR 62 | Temp 98.7°F | Resp 16 | Ht 69.0 in | Wt 168.0 lb

## 2021-05-28 DIAGNOSIS — Z1211 Encounter for screening for malignant neoplasm of colon: Secondary | ICD-10-CM

## 2021-05-28 DIAGNOSIS — I10 Essential (primary) hypertension: Secondary | ICD-10-CM

## 2021-05-28 DIAGNOSIS — N521 Erectile dysfunction due to diseases classified elsewhere: Secondary | ICD-10-CM

## 2021-05-28 DIAGNOSIS — E1169 Type 2 diabetes mellitus with other specified complication: Secondary | ICD-10-CM

## 2021-05-28 DIAGNOSIS — E119 Type 2 diabetes mellitus without complications: Secondary | ICD-10-CM

## 2021-05-28 DIAGNOSIS — N189 Chronic kidney disease, unspecified: Secondary | ICD-10-CM | POA: Diagnosis not present

## 2021-05-28 DIAGNOSIS — E785 Hyperlipidemia, unspecified: Secondary | ICD-10-CM | POA: Insufficient documentation

## 2021-05-28 MED ORDER — ATORVASTATIN CALCIUM 20 MG PO TABS: 20.0000 mg | ORAL_TABLET | Freq: Every day | ORAL | 3 refills | Status: DC

## 2021-05-28 MED ORDER — AMLODIPINE BESYLATE 5 MG PO TABS: 5.0000 mg | ORAL_TABLET | Freq: Every day | ORAL | 3 refills | Status: DC

## 2021-05-28 MED ORDER — METFORMIN HCL 500 MG PO TABS: 500.0000 mg | ORAL_TABLET | Freq: Two times a day (BID) | ORAL | 3 refills | Status: DC

## 2021-05-28 MED ORDER — ONETOUCH ULTRA VI STRP
ORAL_STRIP | 2 refills | Status: DC
Start: 2021-05-28 — End: 2021-10-01

## 2021-05-28 MED ORDER — SILDENAFIL CITRATE 100 MG PO TABS: 100.0000 mg | ORAL_TABLET | Freq: Every day | ORAL | 5 refills | Status: DC | PRN

## 2021-05-28 MED ORDER — HYDROCHLOROTHIAZIDE 25 MG PO TABS: ORAL_TABLET | ORAL | 0 refills | Status: DC

## 2021-05-28 NOTE — Assessment & Plan Note (Addendum)
PatientPlan to add atorvastatin current medication profile refill sent  Metformin refilled  Plan ophthalmology referral

## 2021-05-28 NOTE — Assessment & Plan Note (Signed)
Hypertension well controlled at this time  Will refill amlodipine 5 mg daily and hydrochlorothiazide 25 mg daily

## 2021-05-28 NOTE — Patient Instructions (Signed)
A referral to ophthalmology eye doctor was made  The referral to gastroenterology for colonoscopy was made  Refills on all your medications sent to your [facility] pharmacy  Return to see Dr. Joya Gaskins 4 months

## 2021-05-28 NOTE — Progress Notes (Signed)
F/u DM  Not taking medication

## 2021-05-30 ENCOUNTER — Other Ambulatory Visit: Payer: Self-pay | Admitting: Critical Care Medicine

## 2021-05-30 DIAGNOSIS — E1169 Type 2 diabetes mellitus with other specified complication: Secondary | ICD-10-CM

## 2021-05-30 DIAGNOSIS — N521 Erectile dysfunction due to diseases classified elsewhere: Secondary | ICD-10-CM

## 2021-05-30 NOTE — Telephone Encounter (Signed)
Medication Refill - Medication: sildenafil (VIAGRA) 100 MG tablet  Pt is in distress, he is missing this medication and is completely out of his current supply.   Has the patient contacted their pharmacy? Yes.   (Agent: If no, request that the patient contact the pharmacy for the refill.) (Agent: If yes, when and what did the pharmacy advise?)  Preferred Pharmacy (with phone number or street name):  [facility] [place], [place]  [place] [address]  Phone: [phone] Fax: [phone]    Agent: Please be advised that RX refills may take up to 3 business days. We ask that you follow-up with your pharmacy.

## 2021-06-01 ENCOUNTER — Encounter: Payer: Self-pay | Admitting: Critical Care Medicine

## 2021-06-04 ENCOUNTER — Ambulatory Visit: Payer: Self-pay

## 2021-06-04 NOTE — Addendum Note (Signed)
Addended by: Matilde Sprang on: 06/04/2021 05:19 PM   Modules accepted: Orders

## 2021-06-04 NOTE — Telephone Encounter (Signed)
Pt returned call stating that he still does not have his medication. Reports that the pharmacy is requesting for PA for this Rx.

## 2021-06-04 NOTE — Telephone Encounter (Signed)
Pt called back reporting that his pharmacy does not have his supply, they say they are waiting on his PCP's approval. He also states that he needs his test strips

## 2021-06-04 NOTE — Telephone Encounter (Signed)
[place] called and spoke to [place], [facility] about the refill(s) Sildenafil 100 mg requested. Advised it was sent on 05/28/21 #10/5 refill(s). He says the medication is on hold due to the insurance requiring only 6 pills in 30 days. He says he will connect me to the local store to see exactly when it was refilled last, he's not at the local store. Spoke to [place], Merchant navy officer about the above. She says they have the prescription, she ran it and it came back as needing a prior authorization. She also confirmed the insurance will not cover more than 6 pills in 30 days and says there is no record of the patient ever receiving this medication from [place]. I advised I will let the patient know. I called the patient, left VM to return the call to the office, will need to be made aware that a prior authorization from the provider is needed, sending to the office.

## 2021-06-04 NOTE — Telephone Encounter (Signed)
Patient called regarding his Rx for Sildenafil. He is becoming a bit distressed and would like to get this refilled.  Please advise.

## 2021-06-04 NOTE — Telephone Encounter (Signed)
Requested medication (s) are due for refill today: Yes  Requested medication (s) are on the active medication list: Yes  Last refill:  05/28/21  Future visit scheduled: Yes  Notes to clinic:  Needs Prior Authorization, insurance will only cover 6 pills in 30 days, see notes attached from patient call and call to the pharmacy.     Requested Prescriptions  Pending Prescriptions Disp Refills   sildenafil (VIAGRA) 100 MG tablet 10 tablet 5    Sig: Take 1 tablet (100 mg total) by mouth daily as needed for erectile dysfunction.      Urology: Erectile Dysfunction Agents Passed - 06/04/2021  5:19 PM      Passed - Last BP in normal range    BP Readings from Last 1 Encounters:  05/28/21 115/68          Passed - Valid encounter within last 12 months    Recent Outpatient Visits           1 week ago Colon cancer screening   [place] Elsie Stain, MD   2 months ago Type 2 diabetes mellitus without complication, without long-term current use of insulin [facility])   [place] Elsie Stain, MD   8 months ago Low back pain with radiation   [place], MD   10 months ago Type 2 diabetes mellitus without complication, without long-term current use of insulin ([place])   [place] [facility], [place], MD   1 year ago Essential hypertension   [place], [place], [place]       Future Appointments             In 3 months Elsie Stain, MD [place]              Refused Prescriptions Disp Refills   sildenafil (VIAGRA) 100 MG tablet 10 tablet 5    Sig: Take 1 tablet (100 mg total) by mouth daily as needed for erectile dysfunction.      Urology: Erectile Dysfunction Agents Passed - 06/04/2021  5:19 PM      Passed - Last BP in normal range    BP Readings from Last 1 Encounters:   05/28/21 115/68          Passed - Valid encounter within last 12 months    Recent Outpatient Visits           1 week ago Colon cancer screening   [place] Elsie Stain, MD   2 months ago Type 2 diabetes mellitus without complication, without long-term current use of insulin [facility])   [place] Elsie Stain, MD   8 months ago Low back pain with radiation   [place], [place]   10 months ago Type 2 diabetes mellitus without complication, without long-term current use of insulin ([place])   Louisa [facility], [place], MD   1 year ago Essential hypertension   [place], [place]       Future Appointments             In 3 months Joya Gaskins Burn[place] Harry, MD [facility]

## 2021-06-05 ENCOUNTER — Telehealth: Payer: Self-pay | Admitting: Pharmacist

## 2021-06-05 ENCOUNTER — Telehealth (INDEPENDENT_AMBULATORY_CARE_PROVIDER_SITE_OTHER): Payer: Self-pay

## 2021-06-05 DIAGNOSIS — E1169 Type 2 diabetes mellitus with other specified complication: Secondary | ICD-10-CM

## 2021-06-05 NOTE — Telephone Encounter (Signed)
Returned pt call, He was unavailable. Left HIPAA-compliant VM with instructions to return call. Of note, his sildenafil was sent for a 30-day supply with refills on 05/28/2021 to the [facility] on [address].

## 2021-06-05 NOTE — Telephone Encounter (Signed)
Copied from [place] [id number]. Topic: General - Other >> Jun 05, 2021  3:46 PM Leward Quan A wrote: Reason for CRM: Patient called in to inform Dr Joya Gaskins that he is having a hard time getting his medication due to the high price. Patient stated that in the past he received 6 sildenafil (VIAGRA) 100 MG tablet and that he did not have to pay but now its $200 per the pharmacy and test strips are $100. Please advise  Ph# [phone]

## 2021-06-05 NOTE — Telephone Encounter (Signed)
I do not have control over his [employer] plan copay on this medication

## 2021-06-06 NOTE — Telephone Encounter (Signed)
Attempted to reach patient to inform that Dr. Joya Gaskins can not control his co pay through his insurance for medication. Asked patient to return call to [phone].

## 2021-06-06 NOTE — Telephone Encounter (Signed)
Pt called and is requesting an alternative option, he says he just needs 6 pills because he received these previously for free. He does not need 10 pills he says.

## 2021-06-06 NOTE — Addendum Note (Signed)
Addended by: Daisy Blossom, Annie Main L on: 06/06/2021 11:47 AM   Modules accepted: Orders

## 2021-06-07 ENCOUNTER — Other Ambulatory Visit: Payer: Self-pay

## 2021-06-07 MED ORDER — SILDENAFIL CITRATE 100 MG PO TABS: 100.0000 mg | ORAL_TABLET | Freq: Every day | ORAL | 5 refills | Status: DC | PRN

## 2021-06-07 NOTE — Addendum Note (Signed)
Addended by: Elsie Stain on: 06/07/2021 06:23 AM   Modules accepted: Orders

## 2021-06-07 NOTE — Telephone Encounter (Signed)
Rx for fewer viagra meds sent

## 2021-06-08 ENCOUNTER — Encounter: Payer: Self-pay | Admitting: Pharmacist

## 2021-06-08 ENCOUNTER — Telehealth: Payer: Self-pay

## 2021-06-08 NOTE — Telephone Encounter (Signed)
PA for sildenafil approved until 06/06/2022

## 2021-08-07 ENCOUNTER — Ambulatory Visit: Payer: Self-pay | Admitting: *Deleted

## 2021-08-07 NOTE — Telephone Encounter (Signed)
Summary: Questions for nurse regarding alcohol and drug usage   Pt has questions regarding substance usage, wants to speak to a nurse.   Best contact: [phone]      Pt states he smokes marijuana every day and takes one or two shots evry day. Pt questioning if that is bad for him being a diabetic. Advised yes, offered resources. Pt states he has appt coming up and he would discuss with  Dr. Joya Gaskins then.  Reason for Disposition  General information question, no triage required and triager able to answer question  Answer Assessment - Initial Assessment Questions 1. REASON FOR CALL or QUESTION: "What is your reason for calling today?" or "How can I best help you?" or "What question do you have that I can help answer?"     "Is smoking pot every day bad for me?"  Protocols used: Information Only Call - No Triage-A-AH

## 2021-08-16 LAB — HM DIABETES EYE EXAM

## 2021-10-01 ENCOUNTER — Other Ambulatory Visit: Payer: Self-pay

## 2021-10-01 ENCOUNTER — Ambulatory Visit: Payer: 59 | Attending: Critical Care Medicine | Admitting: Critical Care Medicine

## 2021-10-01 ENCOUNTER — Encounter: Payer: Self-pay | Admitting: Critical Care Medicine

## 2021-10-01 VITALS — BP 161/102 | HR 76 | Resp 16 | Ht 69.0 in | Wt 168.6 lb

## 2021-10-01 DIAGNOSIS — I10 Essential (primary) hypertension: Secondary | ICD-10-CM | POA: Diagnosis not present

## 2021-10-01 DIAGNOSIS — N189 Chronic kidney disease, unspecified: Secondary | ICD-10-CM | POA: Diagnosis not present

## 2021-10-01 DIAGNOSIS — E119 Type 2 diabetes mellitus without complications: Secondary | ICD-10-CM | POA: Diagnosis not present

## 2021-10-01 DIAGNOSIS — Z23 Encounter for immunization: Secondary | ICD-10-CM

## 2021-10-01 DIAGNOSIS — N1831 Chronic kidney disease, stage 3a: Secondary | ICD-10-CM

## 2021-10-01 DIAGNOSIS — E1169 Type 2 diabetes mellitus with other specified complication: Secondary | ICD-10-CM | POA: Diagnosis not present

## 2021-10-01 DIAGNOSIS — E785 Hyperlipidemia, unspecified: Secondary | ICD-10-CM

## 2021-10-01 DIAGNOSIS — N521 Erectile dysfunction due to diseases classified elsewhere: Secondary | ICD-10-CM

## 2021-10-01 LAB — POCT GLYCOSYLATED HEMOGLOBIN (HGB A1C): HbA1c, POC (controlled diabetic range): 6.6 % (ref 0.0–7.0)

## 2021-10-01 LAB — GLUCOSE, POCT (MANUAL RESULT ENTRY): POC Glucose: 138 mg/dl — AB (ref 70–99)

## 2021-10-01 MED ORDER — ATORVASTATIN CALCIUM 20 MG PO TABS: 20.0000 mg | ORAL_TABLET | Freq: Every day | ORAL | 3 refills | Status: DC

## 2021-10-01 MED ORDER — METFORMIN HCL 500 MG PO TABS: 500.0000 mg | ORAL_TABLET | Freq: Two times a day (BID) | ORAL | 3 refills | Status: DC

## 2021-10-01 MED ORDER — AMLODIPINE BESYLATE 10 MG PO TABS: 10.0000 mg | ORAL_TABLET | Freq: Every day | ORAL | 2 refills | Status: DC

## 2021-10-01 MED ORDER — CHLORTHALIDONE 50 MG PO TABS: 50.0000 mg | ORAL_TABLET | Freq: Every day | ORAL | 2 refills | Status: DC

## 2021-10-01 MED ORDER — SILDENAFIL CITRATE 100 MG PO TABS: 100.0000 mg | ORAL_TABLET | Freq: Every day | ORAL | 5 refills | Status: DC | PRN

## 2021-10-01 NOTE — Assessment & Plan Note (Signed)
A1c at goal continue metformin as prescribed  Will prescribe testing supplies for the patient once he gets new insurance

## 2021-10-01 NOTE — Assessment & Plan Note (Signed)
Responds well to Viagra will give smaller numbers of tablets per order to help with co-pays till he gets new insurance

## 2021-10-01 NOTE — Assessment & Plan Note (Addendum)
Not at goal in part due to dietary nonadherence  I reeducated the patient as to proper Dash diet  Increase amlodipine to 10 mg daily and discontinue hydrochlorothiazide in favor of chlorthalidone 50 mg daily  Return to see clinical pharmacy in 2 to 3 weeks for follow-up and then see Dr. Joya Gaskins 4 months

## 2021-10-01 NOTE — Assessment & Plan Note (Signed)
Continue atorvastatin daily

## 2021-10-01 NOTE — Progress Notes (Signed)
Established Patient Office Visit  Subjective:  Patient ID: Timothy Warren, male    DOB: [birth year]  Age: 60 y.o. MRN: [id number]  CC:  Chief Complaint  Patient presents with   Hypertension    HPI Timothy Warren presents for hypertension and type 2 diabetes.  Note on arrival blood pressure still quite elevated and he is on amlodipine 5 mg daily hydrochlorothiazide 25 mg daily.  Note he has been eating quite a bit of salt in the diet.  His A1c is 6.6 and he is compliant with his metformin.  He is also on the atorvastatin daily.  He brings in his ophthalmology exam result and he had 20/20 vision and no evidence of retinopathy seen.  Patient does have a colonoscopy that is going to be completed soon.  The patient did receive his flu vaccine   Patient is not been able to pay for his diabetic testing supplies his insurance plan will not cover it also he has had difficulty with co-pays on the Viagra.He is planning on getting a new insurance plan in the near and also will get vision coverage Past Medical History:  Diagnosis Date   Allergy    Chronic kidney disease    stage 2 CKD   Diabetes mellitus without complication ([place])    Hypertension    Seasonal allergies     Past Surgical History:  Procedure Laterality Date   NO PAST SURGERIES      Family History  Problem Relation Age of Onset   Hypertension Mother    Hypertension Father    Colon cancer Neg Hx    Colon polyps Neg Hx    Esophageal cancer Neg Hx    Rectal cancer Neg Hx    Stomach cancer Neg Hx     Social History   Socioeconomic History   Marital status: Single    Spouse name: Not on file   Number of children: Not on file   Years of education: Not on file   Highest education level: Not on file  Occupational History   Not on file  Tobacco Use   Smoking status: Never   Smokeless tobacco: Never  Vaping Use   Vaping Use: Never used  Substance and Sexual Activity   Alcohol use: Not Currently    Alcohol/week:  1.0 standard drink    Types: 1 Cans of beer per week   Drug use: Yes    Types: Marijuana    Comment: Daily   Sexual activity: Not on file  Other Topics Concern   Not on file  Social History Narrative   Not on file   Social Determinants of Health   Financial Resource Strain: Not on file  Food Insecurity: Not on file  Transportation Needs: Not on file  Physical Activity: Not on file  Stress: Not on file  Social Connections: Not on file  Intimate Partner Violence: Not on file    Outpatient Medications Prior to Visit  Medication Sig Dispense Refill   amLODipine (NORVASC) 5 MG tablet Take 1 tablet (5 mg total) by mouth daily. To lower blood pressure 90 tablet 3   atorvastatin (LIPITOR) 20 MG tablet Take 1 tablet (20 mg total) by mouth daily. 90 tablet 3   Blood Glucose Monitoring Suppl (ONE TOUCH ULTRA 2) w/Device KIT Use to check blood sugar once daily. E11.9 1 kit 0   glucose blood (ONETOUCH ULTRA) test strip Use as instructed to check blood sugar once daily. E11.9 100 each 2  hydrochlorothiazide (HYDRODIURIL) 25 MG tablet Take one daily 90 tablet 0   Lancets (ONETOUCH ULTRASOFT) lancets Use as instructed to check blood sugar once daily. E11.9 100 each 2   metFORMIN (GLUCOPHAGE) 500 MG tablet Take 1 tablet (500 mg total) by mouth 2 (two) times daily with a meal. 180 tablet 3   sildenafil (VIAGRA) 100 MG tablet Take 1 tablet (100 mg total) by mouth daily as needed for erectile dysfunction. 6 tablet 5   No facility-administered medications prior to visit.    No Known Allergies  ROS Review of Systems  Constitutional:  Negative for chills, diaphoresis and fever.  HENT:  Negative for congestion, hearing loss, nosebleeds, sore throat and tinnitus.   Eyes:  Negative for photophobia and redness.  Respiratory:  Negative for cough, shortness of breath, wheezing and stridor.   Cardiovascular:  Negative for chest pain, palpitations and leg swelling.  Gastrointestinal:  Negative for  abdominal pain, blood in stool, constipation, diarrhea, nausea and vomiting.  Endocrine: Negative for polydipsia.  Genitourinary:  Negative for dysuria, flank pain, frequency, hematuria and urgency.  Musculoskeletal:  Negative for back pain, myalgias and neck pain.  Skin:  Negative for rash.  Allergic/Immunologic: Negative for environmental allergies.  Neurological:  Negative for dizziness, tremors, seizures, weakness and headaches.  Hematological:  Does not bruise/bleed easily.  Psychiatric/Behavioral:  Negative for suicidal ideas. The patient is not nervous/anxious.      Objective:    Physical Exam Vitals reviewed.  Constitutional:      Appearance: Normal appearance. He is well-developed. He is not diaphoretic.  HENT:     Head: Normocephalic and atraumatic.     Nose: Nose normal. No nasal deformity, septal deviation, mucosal edema or rhinorrhea.     Right Sinus: No maxillary sinus tenderness or frontal sinus tenderness.     Left Sinus: No maxillary sinus tenderness or frontal sinus tenderness.     Mouth/Throat:     Mouth: Mucous membranes are moist.     Pharynx: Oropharynx is clear. No oropharyngeal exudate.  Eyes:     General: No scleral icterus.    Conjunctiva/sclera: Conjunctivae normal.     Pupils: Pupils are equal, round, and reactive to light.  Neck:     Thyroid: No thyromegaly.     Vascular: No carotid bruit or JVD.     Trachea: Trachea normal. No tracheal tenderness or tracheal deviation.  Cardiovascular:     Rate and Rhythm: Normal rate and regular rhythm.     Chest Wall: PMI is not displaced.     Pulses: Normal pulses. No decreased pulses.     Heart sounds: Normal heart sounds, S1 normal and S2 normal. Heart sounds not distant. No murmur heard. No systolic murmur is present.  No diastolic murmur is present.    No friction rub. No gallop. No S3 or S4 sounds.  Pulmonary:     Effort: Pulmonary effort is normal. No tachypnea, accessory muscle usage or respiratory  distress.     Breath sounds: Normal breath sounds. No stridor. No decreased breath sounds, wheezing, rhonchi or rales.  Chest:     Chest wall: No tenderness.  Abdominal:     General: Bowel sounds are normal. There is no distension.     Palpations: Abdomen is soft. Abdomen is not rigid.     Tenderness: There is no abdominal tenderness. There is no guarding or rebound.  Musculoskeletal:        General: Normal range of motion.     Cervical back:  Normal range of motion and neck supple. No edema, erythema or rigidity. No muscular tenderness. Normal range of motion.  Lymphadenopathy:     Head:     Right side of head: No submental or submandibular adenopathy.     Left side of head: No submental or submandibular adenopathy.     Cervical: No cervical adenopathy.  Skin:    General: Skin is warm and dry.     Coloration: Skin is not pale.     Findings: No rash.     Nails: There is no clubbing.  Neurological:     Mental Status: He is alert and oriented to person, place, and time.     Sensory: No sensory deficit.  Psychiatric:        Mood and Affect: Mood normal.        Speech: Speech normal.        Behavior: Behavior normal.        Thought Content: Thought content normal.        Judgment: Judgment normal.    BP (!) 161/102   Pulse 76   Resp 16   Ht '5\' 9"'  (1.753 m)   Wt 168 lb 9.6 oz (76.5 kg)   SpO2 98%   BMI 24.90 kg/m  Wt Readings from Last 3 Encounters:  10/01/21 168 lb 9.6 oz (76.5 kg)  05/28/21 168 lb (76.2 kg)  03/19/21 165 lb (74.8 kg)     Health Maintenance Due  Topic Date Due   COLONOSCOPY (Pts 45-84yr Insurance coverage will need to be confirmed)  Never done   Zoster Vaccines- Shingrix (1 of 2) Never done   COVID-19 Vaccine (3 - Booster for Moderna series) 03/14/2021    There are no preventive care reminders to display for this patient.  No results found for: TSH Lab Results  Component Value Date   WBC 7.3 03/19/2021   HGB 14.0 03/19/2021   HCT 44.7  03/19/2021   MCV 89 03/19/2021   PLT 276 03/19/2021   Lab Results  Component Value Date   NA 139 03/19/2021   K 3.7 03/19/2021   CO2 25 03/19/2021   GLUCOSE 121 (H) 03/19/2021   BUN 25 (H) 03/19/2021   CREATININE 1.82 (H) 03/19/2021   BILITOT 0.3 03/19/2021   ALKPHOS 58 03/19/2021   AST 17 03/19/2021   ALT 13 03/19/2021   PROT 7.4 03/19/2021   ALBUMIN 4.6 03/19/2021   CALCIUM 9.8 03/19/2021   ANIONGAP 9 01/17/2020   EGFR 43 (L) 03/19/2021   Lab Results  Component Value Date   CHOL 195 03/19/2021   Lab Results  Component Value Date   HDL 61 03/19/2021   Lab Results  Component Value Date   LDLCALC 94 03/19/2021   Lab Results  Component Value Date   TRIG 239 (H) 03/19/2021   Lab Results  Component Value Date   CHOLHDL 3.2 03/19/2021   Lab Results  Component Value Date   HGBA1C 6.6 10/01/2021      Assessment & Plan:   Problem List Items Addressed This Visit       Cardiovascular and Mediastinum   Hypertension    Not at goal in part due to dietary nonadherence  I reeducated the patient as to proper Dash diet  Increase amlodipine to 10 mg daily and discontinue hydrochlorothiazide in favor of chlorthalidone 50 mg daily  Return to see clinical pharmacy in 2 to 3 weeks for follow-up and then see Dr. WJoya Gaskins4 months  Relevant Medications   amLODipine (NORVASC) 10 MG tablet   chlorthalidone (HYGROTON) 50 MG tablet   sildenafil (VIAGRA) 100 MG tablet   atorvastatin (LIPITOR) 20 MG tablet     Endocrine   Type 2 diabetes mellitus without complication, without long-term current use of insulin (HCC) - Primary    A1c at goal continue metformin as prescribed  Will prescribe testing supplies for the patient once he gets new insurance      Relevant Medications   metFORMIN (GLUCOPHAGE) 500 MG tablet   atorvastatin (LIPITOR) 20 MG tablet   Other Relevant Orders   HgB A1c (Completed)   POCT glucose (manual entry) (Completed)   Erectile dysfunction  associated with type 2 diabetes mellitus ([place])    Responds well to Viagra will give smaller numbers of tablets per order to help with co-pays till he gets new insurance      Relevant Medications   metFORMIN (GLUCOPHAGE) 500 MG tablet   sildenafil (VIAGRA) 100 MG tablet   atorvastatin (LIPITOR) 20 MG tablet   Hyperlipidemia due to type 2 diabetes mellitus (HCC)    Continue atorvastatin daily      Relevant Medications   amLODipine (NORVASC) 10 MG tablet   chlorthalidone (HYGROTON) 50 MG tablet   metFORMIN (GLUCOPHAGE) 500 MG tablet   sildenafil (VIAGRA) 100 MG tablet   atorvastatin (LIPITOR) 20 MG tablet     Genitourinary   Stage 3a chronic kidney disease (HCC)    Chronic kidney disease as seen in May of this year we will monitor      Other Visit Diagnoses     Need for immunization against influenza       Relevant Orders   Flu Vaccine QUAD 49moIM (Fluarix, Fluzone & Alfiuria Quad PF) (Completed)   Essential hypertension       Relevant Medications   amLODipine (NORVASC) 10 MG tablet   chlorthalidone (HYGROTON) 50 MG tablet   sildenafil (VIAGRA) 100 MG tablet   atorvastatin (LIPITOR) 20 MG tablet   Chronic kidney disease, unspecified CKD stage       Relevant Medications   amLODipine (NORVASC) 10 MG tablet       Meds ordered this encounter  Medications   amLODipine (NORVASC) 10 MG tablet    Sig: Take 1 tablet (10 mg total) by mouth daily. To lower blood pressure    Dispense:  90 tablet    Refill:  2   chlorthalidone (HYGROTON) 50 MG tablet    Sig: Take 1 tablet (50 mg total) by mouth daily.    Dispense:  90 tablet    Refill:  2   metFORMIN (GLUCOPHAGE) 500 MG tablet    Sig: Take 1 tablet (500 mg total) by mouth 2 (two) times daily with a meal.    Dispense:  180 tablet    Refill:  3   sildenafil (VIAGRA) 100 MG tablet    Sig: Take 1 tablet (100 mg total) by mouth daily as needed for erectile dysfunction.    Dispense:  6 tablet    Refill:  5   atorvastatin  (LIPITOR) 20 MG tablet    Sig: Take 1 tablet (20 mg total) by mouth daily.    Dispense:  90 tablet    Refill:  3  Patient does have a colonoscopy upcoming in the new year  Follow-up: Return in about 4 months (around 01/29/2022).  35 minutes performing detailed exam educating patient complex decision making is high  PAsencion Noble MD

## 2021-10-01 NOTE — Assessment & Plan Note (Signed)
Chronic kidney disease as seen in May of this year we will monitor

## 2021-10-01 NOTE — Patient Instructions (Signed)
Contact us when you get your new insurance plan we will then send new orders for glucose testing supplies  Blood pressure was elevated today please watch her salt intake see attached diet We will increase amlodipine to 10 mg daily and discontinue hydrochlorothiazide and begin chlorthalidone 50 mg daily these 2 medicines together should bring your blood pressure down nicely  No change in your metformin dosing you are doing great on your blood sugar  Your eye exam was normal  We gave you a flu vaccine  Follow-up with your appointments for colonoscopy in the new year  See Lurena Joiner, our clinical pharmacist for your high blood pressure he should see you in the next 2 to 3 weeks to follow-up on your blood pressure with the new medications  Return to see Dr. Joya Gaskins in 4 months

## 2021-10-16 ENCOUNTER — Telehealth: Payer: Self-pay | Admitting: Pharmacist

## 2021-10-16 ENCOUNTER — Ambulatory Visit: Payer: 59 | Attending: Critical Care Medicine | Admitting: Pharmacist

## 2021-10-16 ENCOUNTER — Other Ambulatory Visit: Payer: Self-pay

## 2021-10-16 VITALS — BP 130/73

## 2021-10-16 DIAGNOSIS — G8929 Other chronic pain: Secondary | ICD-10-CM | POA: Insufficient documentation

## 2021-10-16 DIAGNOSIS — I10 Essential (primary) hypertension: Secondary | ICD-10-CM

## 2021-10-16 DIAGNOSIS — M25511 Pain in right shoulder: Secondary | ICD-10-CM

## 2021-10-16 MED ORDER — HYDROCHLOROTHIAZIDE 25 MG PO TABS: 25.0000 mg | ORAL_TABLET | Freq: Every day | ORAL | 1 refills | Status: DC

## 2021-10-16 NOTE — Telephone Encounter (Addendum)
Yes I will order xray of the right shoulder sent to [facility] first floor xray where we will be soon

## 2021-10-16 NOTE — Progress Notes (Signed)
° ° °  S:     No chief complaint on file.  Patient arrives in good spirits. Presents for HTN evaluation, education, and management. Patient was referred and last seen by Primary Care Provider on 10/01/2021. Amlodipine dose was increased and HCTZ was changed to chlorthalidone.   Family/Social History:  -FHx: HTN -Tobacco: never smoker -Alcohol: denies use   Insurance coverage/medication affordability:  [employer]  Medication adherence reported, however, he never picked up the chlorthalidone d/t cost. He continues to take amlodipine and HCTZ.  Current hypertension medications include: amlodipine 10 mg daily, chlorthalidone 50 mg daily (not taking), HCTZ 25 mg daily   Patient reported dietary habits:  - Tries to limit sodium - Denies drinking excessive caffeine   Patient-reported exercise habits:  - Walks 30 minutes daily in addition to work   O:  Vitals:   10/16/21 1611  BP: 130/73   Lab Results  Component Value Date   HGBA1C 6.6 10/01/2021    Lipid Panel     Component Value Date/Time   CHOL 195 03/19/2021 1059   TRIG 239 (H) 03/19/2021 1059   HDL 61 03/19/2021 1059   CHOLHDL 3.2 03/19/2021 1059   LDLCALC 94 03/19/2021 1059    Clinical Atherosclerotic Cardiovascular Disease (ASCVD): No  The 10-year ASCVD risk score (Arnett DK, et al., 2019) is: 22.4%   Values used to calculate the score:     Age: 47 years     Sex: Male     Is Non-Hispanic African American: Yes     Diabetic: Yes     Tobacco smoker: No     Systolic Blood Pressure: 073 mmHg     Is BP treated: Yes     HDL Cholesterol: 61 mg/dL     Total Cholesterol: 195 mg/dL   A/P: Hypertension longstanding currently at goal.  Blood pressure goal = <130/80 mmHg. Medication adherence reported to HCTZ and amlodipine. Will d/c chlorthalidone and keep him on HCTZ + amlodipine for now. -D/C chlorthalidone -Continue amlodipine 10 mg daily.  -Continue HCTZ 25 mg daily.   Written patient instructions provided.   Total time in face to face counseling 15 minutes.   Follow up Clinic Visit in 1 month.  Benard Halsted, PharmD, [facility] [place], [place] [phone]

## 2021-10-16 NOTE — Telephone Encounter (Signed)
Hey Dr. Joya Gaskins.   I saw this pt for BP follow-up. Of note, he was endorsing right shoulder pain and lower back pain. He works in [employer]. Tells me that his pain is chronic. Describes "shooting nerve pain" that goes down his left leg as well.  He is wondering if you could put in orders for an xray to eval this further. I told him I would as you. Thank you!

## 2021-10-16 NOTE — Addendum Note (Signed)
Addended by: Asencion Noble E on: 10/16/2021 04:54 PM   Modules accepted: Orders

## 2021-10-17 ENCOUNTER — Ambulatory Visit
Admission: RE | Admit: 2021-10-17 | Discharge: 2021-10-17 | Disposition: A | Discharge status: Home / Self Care | Payer: 59 | Source: Ambulatory Visit | Attending: Critical Care Medicine | Admitting: Critical Care Medicine

## 2021-10-17 ENCOUNTER — Other Ambulatory Visit: Payer: Self-pay

## 2021-10-17 ENCOUNTER — Other Ambulatory Visit: Payer: Self-pay | Admitting: Critical Care Medicine

## 2021-10-17 DIAGNOSIS — G8929 Other chronic pain: Secondary | ICD-10-CM

## 2021-10-17 DIAGNOSIS — M25512 Pain in left shoulder: Secondary | ICD-10-CM

## 2021-10-17 DIAGNOSIS — M25511 Pain in right shoulder: Secondary | ICD-10-CM

## 2021-10-17 NOTE — Telephone Encounter (Signed)
Thank you Dr. Joya Gaskins. Pt called but was unavailable. Left HIPAA-compliant VM and informed of this.

## 2021-10-19 ENCOUNTER — Other Ambulatory Visit: Payer: Self-pay | Admitting: Critical Care Medicine

## 2021-10-19 DIAGNOSIS — G8929 Other chronic pain: Secondary | ICD-10-CM

## 2021-11-13 ENCOUNTER — Ambulatory Visit (INDEPENDENT_AMBULATORY_CARE_PROVIDER_SITE_OTHER): Payer: 59 | Admitting: Orthopaedic Surgery

## 2021-11-13 ENCOUNTER — Other Ambulatory Visit: Payer: Self-pay

## 2021-11-13 ENCOUNTER — Encounter: Payer: Self-pay | Admitting: Orthopaedic Surgery

## 2021-11-13 DIAGNOSIS — M19012 Primary osteoarthritis, left shoulder: Secondary | ICD-10-CM | POA: Diagnosis not present

## 2021-11-13 MED ORDER — MELOXICAM 7.5 MG PO TABS: 7.5000 mg | ORAL_TABLET | Freq: Two times a day (BID) | ORAL | 2 refills | Status: DC | PRN

## 2021-11-13 NOTE — Progress Notes (Signed)
Office Visit Note   Patient: Timothy Warren           Date of Birth: 25-Aug-1961           MRN: [id number] Visit Date: 11/13/2021              Requested by: Elsie Stain, MD [address],  [place] [address] PCP: Elsie Stain, MD   Assessment & Plan: Visit Diagnoses:  1. Primary osteoarthritis, left shoulder     Plan: Based on findings he does have significant degenerative joint disease of the shoulder joint.  The humeral head is posteriorly positioned within the glenoid.  The posterior glenoid appears to be deficient possibly an old bony [place] injury although he has not reported any dislocation or subluxation events.  We will going to send in a prescription for meloxicam for his condition.  I think it is a good idea to limit lifting to less than 10 pounds permanently.  He would like to hold off on cortisone injection for now.  Follow-up as needed.  Follow-Up Instructions: No follow-ups on file.   Orders:  No orders of the defined types were placed in this encounter.  Meds ordered this encounter  Medications   meloxicam (MOBIC) 7.5 MG tablet    Sig: Take 1 tablet (7.5 mg total) by mouth 2 (two) times daily as needed for pain.    Dispense:  30 tablet    Refill:  2      Procedures: No procedures performed   Clinical Data: No additional findings.   Subjective: Chief Complaint  Patient presents with   Left Shoulder - Pain    [name] is a 61 year old gentleman who comes in for evaluation of chronic left shoulder pain for couple years.  Denies any specific injuries or trauma.  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  He has not had any formal conservative treatments.  He feels like he has good flexibility in the shoulder but it is painful.  He has trouble with certain tasks at work.   Review of Systems  Constitutional: Negative.   All other systems  reviewed and are negative.   Objective: Vital Signs: There were no vitals taken for this visit.  Physical Exam Vitals and nursing note reviewed.  Constitutional:      Appearance: He is well-developed.  Pulmonary:     Effort: Pulmonary effort is normal.  Abdominal:     Palpations: Abdomen is soft.  Skin:    General: Skin is warm.  Neurological:     Mental Status: He is alert and oriented to person, place, and time.  Psychiatric:        Behavior: Behavior normal.        Thought Content: Thought content normal.        Judgment: Judgment normal.    Ortho Exam  Left shoulder shows mild restriction range of motion with guarding and pain.  Rotator cuff grossly intact to manual muscle testing.  Cervical spine exam is unremarkable.  Specialty Comments:  No specialty comments available.  Imaging: No results found.   PMFS History: Patient Active Problem List   Diagnosis Date Noted   Primary osteoarthritis, left shoulder 11/13/2021   Chronic right shoulder pain 10/16/2021   Hyperlipidemia due to type 2 diabetes mellitus ([place]) 05/28/2021   Type 2 diabetes mellitus without complication, without long-term  current use of insulin ([place]) 08/12/2020   Stage 3a chronic kidney disease ([place]) 08/12/2020   Erectile dysfunction associated with type 2 diabetes mellitus ([place]) 08/12/2020   Hypertension 02/17/2020   Past Medical History:  Diagnosis Date   Allergy    Chronic kidney disease    stage 2 CKD   Diabetes mellitus without complication (HCC)    Hypertension    Seasonal allergies     Family History  Problem Relation Age of Onset   Hypertension Mother    Hypertension Father    Colon cancer Neg Hx    Colon polyps Neg Hx    Esophageal cancer Neg Hx    Rectal cancer Neg Hx    Stomach cancer Neg Hx     Past Surgical History:  Procedure Laterality Date   NO PAST SURGERIES     Social History   Occupational History   Not on file  Tobacco Use   Smoking status: Never    Smokeless tobacco: Never  Vaping Use   Vaping Use: Never used  Substance and Sexual Activity   Alcohol use: Not Currently    Alcohol/week: 1.0 standard drink    Types: 1 Cans of beer per week   Drug use: Yes    Types: Marijuana    Comment: Daily   Sexual activity: Not on file

## 2021-11-14 ENCOUNTER — Telehealth: Payer: Self-pay | Admitting: Orthopaedic Surgery

## 2021-11-14 NOTE — Telephone Encounter (Signed)
[employer] forms received. To [employer]. 

## 2021-11-16 ENCOUNTER — Other Ambulatory Visit: Payer: Self-pay

## 2021-11-16 ENCOUNTER — Ambulatory Visit (AMBULATORY_SURGERY_CENTER): Payer: Self-pay | Admitting: *Deleted

## 2021-11-16 ENCOUNTER — Telehealth: Payer: Self-pay | Admitting: Orthopaedic Surgery

## 2021-11-16 ENCOUNTER — Ambulatory Visit: Payer: 59 | Admitting: Pharmacist

## 2021-11-16 VITALS — Ht 69.0 in | Wt 167.8 lb

## 2021-11-16 DIAGNOSIS — Z1211 Encounter for screening for malignant neoplasm of colon: Secondary | ICD-10-CM

## 2021-11-16 MED ORDER — PEG 3350-KCL-NA BICARB-NACL 420 G PO SOLR: 4000.0000 mL | Freq: Once | ORAL | 0 refills | Status: AC

## 2021-11-16 NOTE — Progress Notes (Signed)
No egg or soy allergy known to patient  No issues known to pt with past sedation with any surgeries or procedures Patient denies ever being told they had issues or difficulty with intubation  No FH of Malignant Hyperthermia Pt is not on diet pills Pt is not on  home 02  Pt is not on blood thinners  Pt denies issues with constipation  No A fib or A flutter  Pt is fully vaccinated  for Covid   Discussed with pt there will be an out-of-pocket cost for prep and that varies from $0 to 70 +  dollars - pt verbalized understanding   Due to the COVID-19 pandemic we are asking patients to follow certain guidelines in PV and the [place]   Pt aware of COVID protocols and [facility] guidelines   PV completed over the phone. Pt verified name, DOB, address and insurance during PV today.  Pt mailed instruction packet with copy of consent form to read and not return, and instructions.  Pt encouraged to call with questions or issues.  If pt has My chart, procedure instructions sent via My Chart

## 2021-11-16 NOTE — Telephone Encounter (Signed)
LM for patient to schedule appt with Marlou Sa

## 2021-11-16 NOTE — Telephone Encounter (Signed)
Pt is wanting to have surgery on his shoulder because he can not work with his lifting restriction. Can someone call him to talk about this?   CB [phone]

## 2021-11-16 NOTE — Progress Notes (Signed)
° ° °  S:    Patient presents for HTN evaluation, education, and management. Patient was referred and last seen by Primary Care Provider, Dr. Joya Gaskins, on 10/01/2021. Amlodipine dose was increased and HCTZ was changed to chlorthalidone. At CPP visit 10/16/21, patient had not started chlorthalidone due to cost and continued to take HCTZ instead. BP was at goal at that time and current medications continued. Of note, he was recently started on meloxicam for his shoulder by ortho.   Today, patient arrives in good spirits. Denies dizziness, headaches, swelling, blurred vision. He has not started taking meloxicam. His shoulder is feeling fine today.   Family/Social History:  -FHx: HTN -Tobacco: never smoker -Alcohol: denies use   Insurance coverage/medication affordability: [employer]  Medication adherence reported.  Current hypertension medications include: amlodipine 10 mg daily, HCTZ 25 mg daily   Patient reported dietary habits:  - Tries to limit sodium - Denies drinking excessive caffeine   Patient-reported exercise habits:  - Walks 30 minutes daily in addition to work  Reported home BP: systolic [id number]  O:  Vitals:   11/19/21 1602  BP: 115/77  Pulse: 67    Lab Results  Component Value Date   HGBA1C 6.6 10/01/2021   Lipid Panel     Component Value Date/Time   CHOL 195 03/19/2021 1059   TRIG 239 (H) 03/19/2021 1059   HDL 61 03/19/2021 1059   CHOLHDL 3.2 03/19/2021 1059   LDLCALC 94 03/19/2021 1059   Clinical Atherosclerotic Cardiovascular Disease (ASCVD): No  The 10-year ASCVD risk score (Arnett DK, et al., 2019) is: 18.1%   Values used to calculate the score:     Age: 61 years     Sex: Male     Is Non-Hispanic African American: Yes     Diabetic: Yes     Tobacco smoker: No     Systolic Blood Pressure: 388 mmHg     Is BP treated: Yes     HDL Cholesterol: 61 mg/dL     Total Cholesterol: 195 mg/dL   A/P: Hypertension longstanding currently at goal. Blood  pressure goal = <130/80 mmHg. Medication adherence is reported.  -Continue current medications -Counseled on lifestyle modifications for blood pressure control including reduced dietary sodium, increased exercise, adequate sleep.  Total time in face to face counseling 15 minutes. Follow up Clinic Visit with PCP in March.   Rebbeca Paul, PharmD PGY2 Ambulatory Care Pharmacy Resident 11/19/2021 4:02 PM

## 2021-11-16 NOTE — Telephone Encounter (Signed)
If he is interested in surgery then we will need to refer him to [facility] for shoulder replacement

## 2021-11-16 NOTE — Telephone Encounter (Signed)
[place] for him to have SU?

## 2021-11-19 ENCOUNTER — Other Ambulatory Visit: Payer: Self-pay

## 2021-11-19 ENCOUNTER — Ambulatory Visit: Payer: 59 | Attending: Critical Care Medicine | Admitting: Pharmacist

## 2021-11-19 ENCOUNTER — Encounter: Payer: Self-pay | Admitting: Pharmacist

## 2021-11-19 ENCOUNTER — Telehealth: Payer: Self-pay | Admitting: Orthopaedic Surgery

## 2021-11-19 VITALS — BP 115/77 | HR 67

## 2021-11-19 DIAGNOSIS — I10 Essential (primary) hypertension: Secondary | ICD-10-CM

## 2021-11-19 NOTE — Telephone Encounter (Signed)
Received $25.00 cash, Medical records release form and disability paperwork from patient      Timothy Warren picked up for [place] today

## 2021-11-19 NOTE — Telephone Encounter (Signed)
Auth & payment received for forms at phone room. To [employer].

## 2021-11-23 ENCOUNTER — Telehealth: Payer: Self-pay | Admitting: Orthopaedic Surgery

## 2021-11-23 NOTE — Telephone Encounter (Signed)
Completed forms faxed to [employer]. To [employer] to log & scan.

## 2021-11-23 NOTE — Telephone Encounter (Signed)
Pt called requesting a call back from Kathlee Nations about his short term disability forms. Pt stated he spoke to [place]. Pt phone number is [phone].

## 2021-11-26 NOTE — Telephone Encounter (Signed)
Sent mychart msg.

## 2021-11-27 ENCOUNTER — Encounter: Payer: Self-pay | Admitting: Gastroenterology

## 2021-11-29 ENCOUNTER — Encounter: Payer: Self-pay | Admitting: Certified Registered Nurse Anesthetist

## 2021-11-30 ENCOUNTER — Encounter: Payer: Self-pay | Admitting: Gastroenterology

## 2021-11-30 ENCOUNTER — Ambulatory Visit (AMBULATORY_SURGERY_CENTER): Payer: 59 | Admitting: Gastroenterology

## 2021-11-30 VITALS — BP 131/99 | HR 64 | Temp 98.9°F | Resp 15 | Ht 69.0 in | Wt 167.8 lb

## 2021-11-30 DIAGNOSIS — D122 Benign neoplasm of ascending colon: Secondary | ICD-10-CM

## 2021-11-30 DIAGNOSIS — Z1211 Encounter for screening for malignant neoplasm of colon: Secondary | ICD-10-CM | POA: Diagnosis present

## 2021-11-30 MED ORDER — SODIUM CHLORIDE 0.9 % IV SOLN: 500.0000 mL | Freq: Once | INTRAVENOUS | Status: DC

## 2021-11-30 NOTE — Progress Notes (Signed)
Report given to PACU, vss 

## 2021-11-30 NOTE — Progress Notes (Signed)
Pt's states no medical or surgical changes since previsit or office visit. 

## 2021-11-30 NOTE — Progress Notes (Signed)
HPI: This is a man at routine risk for CRC   ROS: complete GI ROS as described in HPI, all other review negative.  Constitutional:  No unintentional weight loss   Past Medical History:  Diagnosis Date   Allergy    Chronic kidney disease    stage 2 CKD   Diabetes mellitus without complication (HCC)    Hyperlipidemia    Hypertension    Seasonal allergies     Past Surgical History:  Procedure Laterality Date   NO PAST SURGERIES      Current Outpatient Medications  Medication Sig Dispense Refill   amLODipine (NORVASC) 10 MG tablet Take 1 tablet (10 mg total) by mouth daily. To lower blood pressure 90 tablet 2   atorvastatin (LIPITOR) 20 MG tablet Take 1 tablet (20 mg total) by mouth daily. 90 tablet 3   hydrochlorothiazide (HYDRODIURIL) 25 MG tablet Take 1 tablet (25 mg total) by mouth daily. 90 tablet 1   metFORMIN (GLUCOPHAGE) 500 MG tablet Take 1 tablet (500 mg total) by mouth 2 (two) times daily with a meal. 180 tablet 3   sildenafil (VIAGRA) 100 MG tablet Take 1 tablet (100 mg total) by mouth daily as needed for erectile dysfunction. 6 tablet 5   meloxicam (MOBIC) 7.5 MG tablet Take 1 tablet (7.5 mg total) by mouth 2 (two) times daily as needed for pain. 30 tablet 2   Current Facility-Administered Medications  Medication Dose Route Frequency Provider Last Rate Last Admin   0.9 %  sodium chloride infusion  500 mL Intravenous Once Milus Banister, MD       0.9 %  sodium chloride infusion  500 mL Intravenous Once Milus Banister, MD        Allergies as of 11/30/2021   (No Known Allergies)    Family History  Problem Relation Age of Onset   Hypertension Mother    Hypertension Father    Colon cancer Neg Hx    Colon polyps Neg Hx    Esophageal cancer Neg Hx    Rectal cancer Neg Hx    Stomach cancer Neg Hx     Social History   Socioeconomic History   Marital status: Single    Spouse name: Not on file   Number of children: Not on file   Years of education: Not  on file   Highest education level: Not on file  Occupational History   Not on file  Tobacco Use   Smoking status: Never   Smokeless tobacco: Never  Vaping Use   Vaping Use: Never used  Substance and Sexual Activity   Alcohol use: Not Currently    Alcohol/week: 1.0 standard drink    Types: 1 Cans of beer per week    Comment: [place].   Drug use: Yes    Types: Marijuana    Comment: Daily   Sexual activity: Not on file  Other Topics Concern   Not on file  Social History Narrative   Not on file   Social Determinants of Health   Financial Resource Strain: Not on file  Food Insecurity: Not on file  Transportation Needs: Not on file  Physical Activity: Not on file  Stress: Not on file  Social Connections: Not on file  Intimate Partner Violence: Not on file     Physical Exam: BP (!) 153/81    Pulse 69    Temp 98.9 F (37.2 C) (Temporal)    Ht 5\' 9"  (1.753 m)  Wt 167 lb 12.8 oz (76.1 kg)    SpO2 99%    BMI 24.78 kg/m  Constitutional: generally well-appearing Psychiatric: alert and oriented x3 Lungs: CTA bilaterally Heart: no MCR  Assessment and plan: 61 y.o. male with routine risk for CRC  Screening colonoscopy today  Care is appropriate for the ambulatory setting.  Owens Loffler, MD [facility] 11/30/2021, 9:35 AM

## 2021-11-30 NOTE — Progress Notes (Signed)
Called to room to assist during endoscopic procedure.  Patient ID and intended procedure confirmed with present staff. Received instructions for my participation in the procedure from the performing physician.  

## 2021-11-30 NOTE — Op Note (Signed)
[facility] Patient Name: Timothy Warren Procedure Date: 11/30/2021 9:42 AM MRN: [id number] Endoscopist: Milus Banister , MD Age: 61 Referring MD:  Date of Birth: 07/03/1961 Gender: Male Account #: [id number] Procedure:                Colonoscopy Indications:              Screening for colorectal malignant neoplasm Medicines:                Monitored Anesthesia Care Procedure:                Pre-Anesthesia Assessment:                           - Prior to the procedure, a History and Physical                            was performed, and patient medications and                            allergies were reviewed. The patient's tolerance of                            previous anesthesia was also reviewed. The risks                            and benefits of the procedure and the sedation                            options and risks were discussed with the patient.                            All questions were answered, and informed consent                            was obtained. Prior Anticoagulants: The patient has                            taken no previous anticoagulant or antiplatelet                            agents. ASA Grade Assessment: II - A patient with                            mild systemic disease. After reviewing the risks                            and benefits, the patient was deemed in                            satisfactory condition to undergo the procedure.                           After obtaining informed consent, the colonoscope  was passed under direct vision. Throughout the                            procedure, the patient's blood pressure, pulse, and                            oxygen saturations were monitored continuously. The                            Olympus CF-HQ190L [id number]) Colonoscope was                            introduced through the anus and advanced to the the                            cecum, identified by  appendiceal orifice and                            ileocecal valve. The colonoscopy was performed                            without difficulty. The patient tolerated the                            procedure well. The quality of the bowel                            preparation was good. The ileocecal valve,                            appendiceal orifice, and rectum were photographed. Scope In: 9:44:16 AM Scope Out: 9:51:25 AM Scope Withdrawal Time: 0 hours 5 minutes 58 seconds  Total Procedure Duration: 0 hours 7 minutes 9 seconds  Findings:                 A 2 mm polyp was found in the ascending colon. The                            polyp was sessile. The polyp was removed with a                            cold biopsy forceps. Resection and retrieval were                            complete.                           External and internal hemorrhoids were found. The                            hemorrhoids were small.                           The exam was otherwise without abnormality on  direct and retroflexion views. Complications:            No immediate complications. Estimated blood loss:                            None. Estimated Blood Loss:     Estimated blood loss: none. Impression:               - One 2 mm polyp in the ascending colon, removed                            with a cold biopsy forceps. Resected and retrieved.                           - External and internal hemorrhoids.                           - The examination was otherwise normal on direct                            and retroflexion views. Recommendation:           - Patient has a contact number available for                            emergencies. The signs and symptoms of potential                            delayed complications were discussed with the                            patient. Return to normal activities tomorrow.                            Written discharge instructions  were provided to the                            patient.                           - Resume previous diet.                           - Continue present medications.                           - Await pathology results. Milus Banister, MD 11/30/2021 9:55:10 AM This report has been signed electronically.

## 2021-11-30 NOTE — Patient Instructions (Signed)
Handouts on polyps and hemorrhoids given to you today  Await pathology results from Dr. Ardis Hughs  YOU HAD AN ENDOSCOPIC PROCEDURE TODAY AT THE [facility]:   Refer to the procedure report that was given to you for any specific questions about what was found during the examination.  If the procedure report does not answer your questions, please call your gastroenterologist to clarify.  If you requested that your care partner not be given the details of your procedure findings, then the procedure report has been included in a sealed envelope for you to review at your convenience later.  YOU SHOULD EXPECT: Some feelings of bloating in the abdomen. Passage of more gas than usual.  Walking can help get rid of the air that was put into your GI tract during the procedure and reduce the bloating. If you had a lower endoscopy (such as a colonoscopy or flexible sigmoidoscopy) you may notice spotting of blood in your stool or on the toilet paper. If you underwent a bowel prep for your procedure, you may not have a normal bowel movement for a few days.  Please Note:  You might notice some irritation and congestion in your nose or some drainage.  This is from the oxygen used during your procedure.  There is no need for concern and it should clear up in a day or so.  SYMPTOMS TO REPORT IMMEDIATELY:  Following lower endoscopy (colonoscopy or flexible sigmoidoscopy):  Excessive amounts of blood in the stool  Significant tenderness or worsening of abdominal pains  Swelling of the abdomen that is new, acute  Fever of 100F or higher  For urgent or emergent issues, a gastroenterologist can be reached at any hour by calling [phone]. Do not use MyChart messaging for urgent concerns.    DIET:  We do recommend a small meal at first, but then you may proceed to your regular diet.  Drink plenty of fluids but you should avoid alcoholic beverages for 24 hours.  ACTIVITY:  You should plan to take it  easy for the rest of today and you should NOT DRIVE or use heavy machinery until tomorrow (because of the sedation medicines used during the test).    FOLLOW UP: Our staff will call the number listed on your records 48-72 hours following your procedure to check on you and address any questions or concerns that you may have regarding the information given to you following your procedure. If we do not reach you, we will leave a message.  We will attempt to reach you two times.  During this call, we will ask if you have developed any symptoms of COVID 19. If you develop any symptoms (ie: fever, flu-like symptoms, shortness of breath, cough etc.) before then, please call [phone].  If you test positive for Covid 19 in the 2 weeks post procedure, please call and report this information to [place].    If any biopsies were taken you will be contacted by phone or by letter within the next 1-3 weeks.  Please call us at [phone] if you have not heard about the biopsies in 3 weeks.    SIGNATURES/CONFIDENTIALITY: You and/or your care partner have signed paperwork which will be entered into your electronic medical record.  These signatures attest to the fact that that the information above on your After Visit Summary has been reviewed and is understood.  Full responsibility of the confidentiality of this discharge information lies with you and/or your care-partner.

## 2021-12-04 ENCOUNTER — Encounter: Payer: Self-pay | Admitting: Orthopaedic Surgery

## 2021-12-04 ENCOUNTER — Telehealth: Payer: Self-pay

## 2021-12-04 ENCOUNTER — Other Ambulatory Visit: Payer: Self-pay

## 2021-12-04 ENCOUNTER — Ambulatory Visit (INDEPENDENT_AMBULATORY_CARE_PROVIDER_SITE_OTHER): Payer: 59 | Admitting: Orthopaedic Surgery

## 2021-12-04 VITALS — Ht 69.0 in | Wt 167.0 lb

## 2021-12-04 DIAGNOSIS — M19012 Primary osteoarthritis, left shoulder: Secondary | ICD-10-CM | POA: Diagnosis not present

## 2021-12-04 NOTE — Progress Notes (Signed)
Office Visit Note   Patient: Timothy Warren           Date of Birth: 31-May-1961           MRN: [id number] Visit Date: 12/04/2021              Requested by: Elsie Stain, MD [address],  [place] [id number] PCP: Elsie Stain, MD   Assessment & Plan: Visit Diagnoses:  1. Primary osteoarthritis, left shoulder     Plan: Impression is left shoulder osteoarthritis.  We have discussed providing the patient with a work note to return to full duty without restrictions this coming Monday 12/10/21 per patient's wishes.  He will follow-up with [place] as needed.  Follow-Up Instructions: Return if symptoms worsen or fail to improve.   Orders:  No orders of the defined types were placed in this encounter.  No orders of the defined types were placed in this encounter.     Procedures: No procedures performed   Clinical Data: No additional findings.   Subjective: Chief Complaint  Patient presents with   Left Shoulder - Pain    HPI patient is a pleasant 61 year old who comes in today for follow-up of his left shoulder.  History of degenerative joint disease to the left shoulder.  He was seen in our office a few weeks ago for this where he was put on light duty as do not lift greater than 10 pounds of left upper extremity.  He notes that work is unable to accommodate this.  He is requesting a work note to return to full duty without restrictions.  He is actually in less pain now than he was a few weeks ago.     Objective: Vital Signs: Ht 5\' 9"  (1.753 m)    Wt 167 lb (75.8 kg)    BMI 24.66 kg/m     Ortho Exam unchanged left shoulder exam  Specialty Comments:  No specialty comments available.  Imaging: No new imaging   PMFS History: Patient Active Problem List   Diagnosis Date Noted   Primary osteoarthritis, left shoulder 11/13/2021   Chronic right shoulder pain 10/16/2021   Hyperlipidemia due to type 2 diabetes mellitus ([place]) 05/28/2021   Type 2 diabetes  mellitus without complication, without long-term current use of insulin ([place]) 08/12/2020   Stage 3a chronic kidney disease ([place]) 08/12/2020   Erectile dysfunction associated with type 2 diabetes mellitus ([place]) 08/12/2020   Hypertension 02/17/2020   Past Medical History:  Diagnosis Date   Allergy    Chronic kidney disease    stage 2 CKD   Diabetes mellitus without complication (HCC)    Hyperlipidemia    Hypertension    Seasonal allergies     Family History  Problem Relation Age of Onset   Hypertension Mother    Hypertension Father    Colon cancer Neg Hx    Colon polyps Neg Hx    Esophageal cancer Neg Hx    Rectal cancer Neg Hx    Stomach cancer Neg Hx     Past Surgical History:  Procedure Laterality Date   NO PAST SURGERIES     Social History   Occupational History   Not on file  Tobacco Use   Smoking status: Never   Smokeless tobacco: Never  Vaping Use   Vaping Use: Never used  Substance and Sexual Activity   Alcohol use: Not Currently    Alcohol/week: 1.0 standard drink    Types: 1 Cans  of beer per week    Comment: [place].   Drug use: Yes    Types: Marijuana    Comment: Daily   Sexual activity: Not on file

## 2021-12-04 NOTE — Telephone Encounter (Signed)
°  Follow up Call-  Call back number 11/30/2021  Post procedure Call Back phone  # [phone]  Permission to leave phone message Yes  Some recent data might be hidden     Patient questions:  Do you have a fever, pain , or abdominal swelling? No. Pain Score  0 *  Have you tolerated food without any problems? Yes.    Have you been able to return to your normal activities? Yes.    Do you have any questions about your discharge instructions: Diet   No. Medications  No. Follow up visit  No.  Do you have questions or concerns about your Care? No.  Actions: * If pain score is 4 or above: No action needed, pain <4.  Have you developed a fever since your procedure? no  2.   Have you had an respiratory symptoms (SOB or cough) since your procedure? no  3.   Have you tested positive for COVID 19 since your procedure no  4.   Have you had any family members/close contacts diagnosed with the COVID 19 since your procedure?  no   If yes to any of these questions please route to Joylene John, RN and Joella Prince, RN

## 2021-12-04 NOTE — Telephone Encounter (Signed)
Faxed today's work note to [phone] [employer] per patients request.

## 2021-12-05 ENCOUNTER — Encounter: Payer: Self-pay | Admitting: Gastroenterology

## 2021-12-24 ENCOUNTER — Other Ambulatory Visit: Payer: Self-pay

## 2021-12-28 ENCOUNTER — Telehealth: Payer: Self-pay | Admitting: Critical Care Medicine

## 2021-12-28 NOTE — Telephone Encounter (Signed)
Copied from [place] [id number]. Topic: Quick Communication - Rx Refill/Question >> Dec 27, 2021  5:13 PM Pawlus, Brayton Layman A wrote: Pt stated sildenafil (VIAGRA) 100 MG tablet is too expensive and wanted to know if a generic or different medication could be sent into [facility], please advise.

## 2021-12-28 NOTE — Telephone Encounter (Signed)
Fyi (Called pt and left vm letting him know that you are out of office )

## 2021-12-28 NOTE — Telephone Encounter (Signed)
Pt called back stated he received [name]'s voice message but would like to speak with her directly, requesting a call back.

## 2021-12-31 ENCOUNTER — Other Ambulatory Visit: Payer: Self-pay

## 2021-12-31 MED ORDER — TADALAFIL 10 MG PO TABS: 10.0000 mg | ORAL_TABLET | ORAL | 1 refills | Status: DC | PRN

## 2021-12-31 NOTE — Telephone Encounter (Signed)
Called pt and he is aware of order

## 2021-12-31 NOTE — Telephone Encounter (Signed)
Sending generic cialis to [facility]

## 2022-01-02 ENCOUNTER — Other Ambulatory Visit: Payer: Self-pay

## 2022-01-11 ENCOUNTER — Encounter: Payer: Self-pay | Admitting: Radiology

## 2022-01-11 ENCOUNTER — Telehealth: Payer: Self-pay

## 2022-01-11 NOTE — Telephone Encounter (Signed)
Yes that is fine

## 2022-01-11 NOTE — Telephone Encounter (Signed)
Pt would like to come get it now!  ?

## 2022-01-11 NOTE — Telephone Encounter (Signed)
Patient called into the office and would like to know if he can have a note with restrictions stating that he can not lift anything without a crane. ? ?Please advise   ?

## 2022-01-14 ENCOUNTER — Telehealth: Payer: Self-pay

## 2022-01-14 NOTE — Telephone Encounter (Signed)
Yes please. Thanks. 

## 2022-01-14 NOTE — Telephone Encounter (Signed)
Patient came into the office and would like to go ahead and set up to have surgery. Please advise  ?

## 2022-01-14 NOTE — Telephone Encounter (Signed)
Note ready for pick up at the front desk. Patient aware. ? ?

## 2022-01-23 ENCOUNTER — Ambulatory Visit (INDEPENDENT_AMBULATORY_CARE_PROVIDER_SITE_OTHER): Payer: 59 | Admitting: Orthopedic Surgery

## 2022-01-23 ENCOUNTER — Other Ambulatory Visit: Payer: Self-pay

## 2022-01-23 ENCOUNTER — Encounter: Payer: Self-pay | Admitting: Orthopedic Surgery

## 2022-01-23 DIAGNOSIS — M19012 Primary osteoarthritis, left shoulder: Secondary | ICD-10-CM | POA: Diagnosis not present

## 2022-01-23 NOTE — Progress Notes (Signed)
? ?Office Visit Note ?  ?Patient: Timothy Warren           ?Date of Birth: Jun 20, 1961           ?MRN: [id number] ?Visit Date: 01/23/2022 ?Requested by: Elsie Stain, MD ?[address] ?[address] ?[place],  [place] [address] ?PCP: Elsie Stain, MD ? ?Subjective: ?Chief Complaint  ?Patient presents with  ? Left Shoulder - Pain  ? ? ?HPI: Timothy Warren is a 61 y.o. male who presents to the office complaining of left shoulder pain.  Patient states that he has had pain for several years.  Localizes pain to the anterolateral aspect of the left shoulder.  No radiation down the arm.  Occasional radiation to the neck.  Denies any scapular pain or numbness and tingling throughout the arm except in his fourth and fifth fingers.  No numbness or tingling in the other fingers though he does endorse numbness in his bilateral feet.  He takes meloxicam 7.5 mg.  Supposed to return to work on 01/28/2022.  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  Does have history of diabetes with last A1c 6.6.  Also has history of hypertension and CKD.  No history of prior surgery to the shoulder or neck.  No history of dislocation of the shoulder.  He wants to eventually return to weightlifting..   ?             ?ROS: All systems reviewed are negative as they relate to the chief complaint within the history of present illness.  Patient denies fevers or chills. ? ?Assessment & Plan: ?Visit Diagnoses:  ?1. Primary osteoarthritis, left shoulder   ? ? ?Plan: Patient is a 61 year old male who presents for evaluation of left shoulder pain.  He has history of left shoulder glenohumeral arthritis that was noted on radiographs from December 2022.  He has had pain for several years and has a very physical job working in a [place].  He would like to return to work and at this point is not really interested in surgical intervention for his shoulder.  Did discuss shoulder replacement and what this entails to  educate the patient.  After discussion of options, he would like to try a left shoulder injection with Dr. Ernestina Patches on 01/25/2022 with return to work on 01/28/2022.  He will follow-up with the office in 6 months for clinical recheck.  In general [place] has pretty good functional range of motion and strength with the left shoulder.  He may be several years away from surgical intervention.  His current job which requires a lot of heavy lifting may not be compatible with long-term pain-free function of that left shoulder. ? ?low-Up Instructions: No follow-ups on file.  ? ?Orders:  ?Orders Placed This Encounter  ?Procedures  ? Ambulatory referral to Physical Medicine Rehab  ? ?No orders of the defined types were placed in this encounter. ? ? ? ? Procedures: ?No procedures performed ? ? ?Clinical Data: ?No additional findings. ? ?Objective: ?Vital Signs: There were no vitals taken for this visit. ? ?Physical Exam:  ?Constitutional: Patient appears well-developed ?HEENT:  ?Head: Normocephalic ?Eyes:EOM are normal ?Neck: Normal range of motion ?Cardiovascular: Normal rate ?Pulmonary/chest: Effort normal ?Neurologic: Patient is alert ?Skin: Skin is warm ?Psychiatric: Patient has normal mood and affect ? ?Ortho Exam: Ortho exam demonstrates left shoulder with 30 degrees external rotation, 80 degrees abduction, 150 degrees forward flexion.  This compared with the  right shoulder with 50 degrees external rotation, 105 degrees abduction, 175 degrees forward flexion.  Axillary nerve is intact with deltoid firing of the left shoulder.  Excellent supraspinatus and subscapularis strength rated 5/5.  5/5 external rotation strength of both shoulders.  Negative Hornblower sign bilaterally.  5/5 motor strength of bilateral grip strength, pronation/supination, bicep, tricep, deltoid.  No muscle wasting or atrophy noted.   ? ?Specialty Comments:  ?No specialty comments available. ? ?Imaging: ?No results found. ? ? ?PMFS History: ?Patient Active  Problem List  ? Diagnosis Date Noted  ? Primary osteoarthritis, left shoulder 11/13/2021  ? Chronic right shoulder pain 10/16/2021  ? Hyperlipidemia due to type 2 diabetes mellitus ([place]) 05/28/2021  ? Type 2 diabetes mellitus without complication, without long-term current use of insulin ([place]) 08/12/2020  ? Stage 3a chronic kidney disease ([place]) 08/12/2020  ? Erectile dysfunction associated with type 2 diabetes mellitus ([place]) 08/12/2020  ? Hypertension 02/17/2020  ? ?Past Medical History:  ?Diagnosis Date  ? Allergy   ? Chronic kidney disease   ? stage 2 CKD  ? Diabetes mellitus without complication ([place])   ? Hyperlipidemia   ? Hypertension   ? Seasonal allergies   ?  ?Family History  ?Problem Relation Age of Onset  ? Hypertension Mother   ? Hypertension Father   ? Colon cancer Neg Hx   ? Colon polyps Neg Hx   ? Esophageal cancer Neg Hx   ? Rectal cancer Neg Hx   ? Stomach cancer Neg Hx   ?  ?Past Surgical History:  ?Procedure Laterality Date  ? NO PAST SURGERIES    ? ?Social History  ? ?Occupational History  ? Not on file  ?Tobacco Use  ? Smoking status: Never  ? Smokeless tobacco: Never  ?Vaping Use  ? Vaping Use: Never used  ?Substance and Sexual Activity  ? Alcohol use: Not Currently  ?  Alcohol/week: 1.0 standard drink  ?  Types: 1 Cans of beer per week  ?  Comment: SVERAL TIMES AWEEK.  ? Drug use: Yes  ?  Types: Marijuana  ?  Comment: Daily  ? Sexual activity: Not on file  ? ? ? ? ?  ?

## 2022-01-26 ENCOUNTER — Encounter: Payer: Self-pay | Admitting: Orthopedic Surgery

## 2022-01-28 ENCOUNTER — Other Ambulatory Visit: Payer: Self-pay

## 2022-01-28 ENCOUNTER — Telehealth: Payer: Self-pay | Admitting: Critical Care Medicine

## 2022-01-28 ENCOUNTER — Encounter: Payer: Self-pay | Admitting: Physical Medicine and Rehabilitation

## 2022-01-28 ENCOUNTER — Ambulatory Visit: Payer: Self-pay

## 2022-01-28 ENCOUNTER — Ambulatory Visit (INDEPENDENT_AMBULATORY_CARE_PROVIDER_SITE_OTHER): Payer: 59 | Admitting: Physical Medicine and Rehabilitation

## 2022-01-28 DIAGNOSIS — M25511 Pain in right shoulder: Secondary | ICD-10-CM | POA: Diagnosis not present

## 2022-01-28 DIAGNOSIS — G8929 Other chronic pain: Secondary | ICD-10-CM | POA: Diagnosis not present

## 2022-01-28 MED ORDER — SILDENAFIL CITRATE 50 MG PO TABS
50.0000 mg | ORAL_TABLET | Freq: Every day | ORAL | 2 refills | Status: DC | PRN
Start: 2022-01-28 — End: 2022-05-13

## 2022-01-28 NOTE — Progress Notes (Signed)
Pt state left shoulder pain. Pt state any movement with his left arm makes the pain worse. Pt state he take [place] meds to help ease his pain.  ? ?Numeric Pain Rating Scale and Functional Assessment ?Average Pain 4 ? ? ?In the last MONTH (on 0-10 scale) has pain interfered with the following? ? ?1. General activity like being  able to carry out your everyday physical activities such as walking, climbing stairs, carrying groceries, or moving a chair?  ?Rating(10) ? ? ?-BT, -Dye Allergies. ? ?

## 2022-01-28 NOTE — Progress Notes (Signed)
? ?  Timothy Warren - 61 y.o. male MRN [id number]  Date of birth: Apr 20, 1961 ? ?Office Visit Note: ?Visit Date: 01/28/2022 ?PCP: Elsie Stain, MD ?Referred by: Elsie Stain, MD ? ?Subjective: ?Chief Complaint  ?Patient presents with  ? Left Shoulder - Pain  ? ?HPI:  Timothy Warren is a 61 y.o. male who comes in today at the request of Dr. Eduard Roux for planned Left anesthetic glenohumeral arthrogram with fluoroscopic guidance.  The patient has failed conservative care including home exercise, medications, time and activity modification.  This injection will be diagnostic and hopefully therapeutic.  Please see requesting physician notes for further details and justification.  ? ?ROS Otherwise per HPI. ? ?Assessment & Plan: ?Visit Diagnoses:  ?  ICD-10-CM   ?1. Chronic right shoulder pain  M25.511 XR C-ARM NO REPORT  ? G89.29   ?  ?  ?Plan: No additional findings.  ? ?Meds & Orders: No orders of the defined types were placed in this encounter. ?  ?Orders Placed This Encounter  ?Procedures  ? Large Joint Inj  ? XR C-ARM NO REPORT  ?  ?Follow-up: Return for visit to requesting provider as needed.  ? ?Procedures: ?Large Joint Inj: R glenohumeral on 01/28/2022 1:00 PM ?Indications: pain and diagnostic evaluation ?Details: 22 G 3.5 in needle, fluoroscopy-guided anteromedial approach ? ?Arthrogram: No ? ?Medications: 40 mg triamcinolone acetonide 40 MG/ML; 5 mL bupivacaine 0.25 % ?Outcome: tolerated well, no immediate complications ? ?There was excellent flow of contrast producing a partial arthrogram of the glenohumeral joint. The patient did have relief of symptoms during the anesthetic phase of the injection. ?Procedure, treatment alternatives, risks and benefits explained, specific risks discussed. Consent was given by the patient. Immediately prior to procedure a time out was called to verify the correct patient, procedure, equipment, support staff and site/side marked as required. Patient was prepped and draped  in the usual sterile fashion.  ? ?  ?   ? ?Clinical History: ?No specialty comments available.  ? ? ? ?Objective:  VS:  HT:    WT:   BMI:     BP:   HR: bpm  TEMP: ( )  RESP:  ?Physical Exam  ? ?Imaging: ?XR C-ARM NO REPORT ? ?Result Date: 01/28/2022 ?Please see Notes tab for imaging impression.  ?

## 2022-01-28 NOTE — Telephone Encounter (Signed)
Called and left vm  °

## 2022-01-28 NOTE — Telephone Encounter (Signed)
Pt wants viagra, I sent to [facility] ?

## 2022-01-29 ENCOUNTER — Ambulatory Visit: Payer: 59 | Admitting: Critical Care Medicine

## 2022-01-29 MED ORDER — TRIAMCINOLONE ACETONIDE 40 MG/ML IJ SUSP
40.0000 mg | INTRAMUSCULAR | Status: AC | PRN
  Administered 2022-01-28: 40 mg via INTRA_ARTICULAR

## 2022-01-29 MED ORDER — BUPIVACAINE HCL 0.25 % IJ SOLN
5.0000 mL | INTRAMUSCULAR | Status: AC | PRN
  Administered 2022-01-28: 5 mL via INTRA_ARTICULAR

## 2022-02-24 NOTE — Progress Notes (Signed)
? ?Established Patient Office Visit ? ?Subjective:  ?Patient ID: Timothy Warren, male    DOB: 1961-06-13  Age: 61 y.o. MRN: [id number] ? ?CC:  ?Chief Complaint  ?Patient presents with  ? Diabetes  ? ? ?HPI ?09/2021 ?Timothy Warren presents for hypertension and type 2 diabetes.  Note on arrival blood pressure still quite elevated and he is on amlodipine 5 mg daily hydrochlorothiazide 25 mg daily.  Note he has been eating quite a bit of salt in the diet.  His A1c is 6.6 and he is compliant with his metformin.  He is also on the atorvastatin daily. ? ?He brings in his ophthalmology exam result and he had 20/20 vision and no evidence of retinopathy seen. ? ?Patient does have a colonoscopy that is going to be completed soon. ? ?The patient did receive his flu vaccine ?  ?Patient is not been able to pay for his diabetic testing supplies his insurance plan will not cover it also he has had difficulty with co-pays on the Viagra.He is planning on getting a new insurance plan in the near and also will get vision coverage ? ?02/25/2022 ?Patient returns in short-term follow-up on arrival blood pressure elevated 157/80 blood sugar 171 A1c however is good at 6.3 ? ?Patient did have a colonoscopy in March of this year does not have to have repeat in 7 years I found 1 small polyp on the patient.  Patient states he has been under some degree of stress has not been eating very healthy at times.  He is taking Viagra for erectile dysfunction and this is helped him ?Patient has no other complaints ?Past Medical History:  ?Diagnosis Date  ? Allergy   ? Chronic kidney disease   ? stage 2 CKD  ? Diabetes mellitus without complication ([place])   ? Hyperlipidemia   ? Hypertension   ? Seasonal allergies   ? ? ?Past Surgical History:  ?Procedure Laterality Date  ? NO PAST SURGERIES    ? ? ?Family History  ?Problem Relation Age of Onset  ? Hypertension Mother   ? Hypertension Father   ? Colon cancer Neg Hx   ? Colon polyps Neg Hx   ? Esophageal cancer  Neg Hx   ? Rectal cancer Neg Hx   ? Stomach cancer Neg Hx   ? ? ?Social History  ? ?Socioeconomic History  ? Marital status: Single  ?  Spouse name: Not on file  ? Number of children: Not on file  ? Years of education: Not on file  ? Highest education level: Not on file  ?Occupational History  ? Not on file  ?Tobacco Use  ? Smoking status: Never  ? Smokeless tobacco: Never  ?Vaping Use  ? Vaping Use: Never used  ?Substance and Sexual Activity  ? Alcohol use: Not Currently  ?  Alcohol/week: 1.0 standard drink  ?  Types: 1 Cans of beer per week  ?  Comment: SVERAL TIMES AWEEK.  ? Drug use: Yes  ?  Types: Marijuana  ?  Comment: Daily  ? Sexual activity: Not on file  ?Other Topics Concern  ? Not on file  ?Social History Narrative  ? Not on file  ? ?Social Determinants of Health  ? ?Financial Resource Strain: Not on file  ?Food Insecurity: Not on file  ?Transportation Needs: Not on file  ?Physical Activity: Not on file  ?Stress: Not on file  ?Social Connections: Not on file  ?Intimate Partner Violence: Not on file  ? ? ?  Outpatient Medications Prior to Visit  ?Medication Sig Dispense Refill  ? sildenafil (VIAGRA) 50 MG tablet Take 1 tablet (50 mg total) by mouth daily as needed for erectile dysfunction. 10 tablet 2  ? amLODipine (NORVASC) 10 MG tablet Take 1 tablet (10 mg total) by mouth daily. To lower blood pressure 90 tablet 2  ? atorvastatin (LIPITOR) 20 MG tablet Take 1 tablet (20 mg total) by mouth daily. 90 tablet 3  ? hydrochlorothiazide (HYDRODIURIL) 25 MG tablet Take 1 tablet (25 mg total) by mouth daily. 90 tablet 1  ? meloxicam (MOBIC) 7.5 MG tablet Take 1 tablet (7.5 mg total) by mouth 2 (two) times daily as needed for pain. 30 tablet 2  ? metFORMIN (GLUCOPHAGE) 500 MG tablet Take 1 tablet (500 mg total) by mouth 2 (two) times daily with a meal. 180 tablet 3  ? ?No facility-administered medications prior to visit.  ? ? ?No Known Allergies ? ?ROS ?Review of Systems  ?Constitutional:  Negative for chills,  diaphoresis and fever.  ?HENT:  Negative for congestion, hearing loss, nosebleeds, sore throat and tinnitus.   ?Eyes:  Negative for photophobia and redness.  ?Respiratory:  Negative for cough, shortness of breath, wheezing and stridor.   ?Cardiovascular:  Negative for chest pain, palpitations and leg swelling.  ?Gastrointestinal:  Negative for abdominal pain, blood in stool, constipation, diarrhea, nausea and vomiting.  ?Endocrine: Negative for polydipsia.  ?Genitourinary:  Negative for dysuria, flank pain, frequency, hematuria and urgency.  ?Musculoskeletal:  Negative for back pain, myalgias and neck pain.  ?Skin:  Negative for rash.  ?Allergic/Immunologic: Negative for environmental allergies.  ?Neurological:  Negative for dizziness, tremors, seizures, weakness and headaches.  ?Hematological:  Does not bruise/bleed easily.  ?Psychiatric/Behavioral:  Negative for suicidal ideas. The patient is not nervous/anxious.   ? ?  ?Objective:  ?  ?Physical Exam ?Vitals reviewed.  ?Constitutional:   ?   Appearance: Normal appearance. He is well-developed. He is not diaphoretic.  ?HENT:  ?   Head: Normocephalic and atraumatic.  ?   Nose: Nose normal. No nasal deformity, septal deviation, mucosal edema or rhinorrhea.  ?   Right Sinus: No maxillary sinus tenderness or frontal sinus tenderness.  ?   Left Sinus: No maxillary sinus tenderness or frontal sinus tenderness.  ?   Mouth/Throat:  ?   Mouth: Mucous membranes are moist.  ?   Pharynx: Oropharynx is clear. No oropharyngeal exudate.  ?Eyes:  ?   General: No scleral icterus. ?   Conjunctiva/sclera: Conjunctivae normal.  ?   Pupils: Pupils are equal, round, and reactive to light.  ?Neck:  ?   Thyroid: No thyromegaly.  ?   Vascular: No carotid bruit or JVD.  ?   Trachea: Trachea normal. No tracheal tenderness or tracheal deviation.  ?Cardiovascular:  ?   Rate and Rhythm: Normal rate and regular rhythm.  ?   Chest Wall: PMI is not displaced.  ?   Pulses: Normal pulses. No  decreased pulses.  ?   Heart sounds: Normal heart sounds, S1 normal and S2 normal. Heart sounds not distant. No murmur heard. ?No systolic murmur is present.  ?No diastolic murmur is present.  ?  No friction rub. No gallop. No S3 or S4 sounds.  ?Pulmonary:  ?   Effort: Pulmonary effort is normal. No tachypnea, accessory muscle usage or respiratory distress.  ?   Breath sounds: Normal breath sounds. No stridor. No decreased breath sounds, wheezing, rhonchi or rales.  ?Chest:  ?  Chest wall: No tenderness.  ?Abdominal:  ?   General: Bowel sounds are normal. There is no distension.  ?   Palpations: Abdomen is soft. Abdomen is not rigid.  ?   Tenderness: There is no abdominal tenderness. There is no guarding or rebound.  ?Musculoskeletal:     ?   General: Normal range of motion.  ?   Cervical back: Normal range of motion and neck supple. No edema, erythema or rigidity. No muscular tenderness. Normal range of motion.  ?Lymphadenopathy:  ?   Head:  ?   Right side of head: No submental or submandibular adenopathy.  ?   Left side of head: No submental or submandibular adenopathy.  ?   Cervical: No cervical adenopathy.  ?Skin: ?   General: Skin is warm and dry.  ?   Coloration: Skin is not pale.  ?   Findings: No rash.  ?   Nails: There is no clubbing.  ?Neurological:  ?   Mental Status: He is alert and oriented to person, place, and time.  ?   Sensory: No sensory deficit.  ?Psychiatric:     ?   Mood and Affect: Mood normal.     ?   Speech: Speech normal.     ?   Behavior: Behavior normal.     ?   Thought Content: Thought content normal.     ?   Judgment: Judgment normal.  ? ? ?BP (!) 157/80 (BP Location: Left Arm, Patient Position: Sitting, Cuff Size: Normal)   Pulse 74   Resp 17   Ht '5\' 9"'$  (1.753 m)   Wt 169 lb (76.7 kg)   SpO2 96%   BMI 24.96 kg/m?  ?Wt Readings from Last 3 Encounters:  ?02/25/22 169 lb (76.7 kg)  ?12/04/21 167 lb (75.8 kg)  ?11/30/21 167 lb 12.8 oz (76.1 kg)  ? ? ? ?Health Maintenance Due  ?Topic  Date Due  ? Zoster Vaccines- Shingrix (1 of 2) Never done  ? COVID-19 Vaccine (3 - Booster for Moderna series) 03/14/2021  ? ? ?There are no preventive care reminders to display for this patient. ? ?No results found f

## 2022-02-25 ENCOUNTER — Encounter: Payer: Self-pay | Admitting: Critical Care Medicine

## 2022-02-25 ENCOUNTER — Ambulatory Visit: Payer: 59 | Attending: Critical Care Medicine | Admitting: Critical Care Medicine

## 2022-02-25 VITALS — BP 157/80 | HR 74 | Resp 17 | Ht 69.0 in | Wt 169.0 lb

## 2022-02-25 DIAGNOSIS — N1831 Chronic kidney disease, stage 3a: Secondary | ICD-10-CM | POA: Diagnosis not present

## 2022-02-25 DIAGNOSIS — E119 Type 2 diabetes mellitus without complications: Secondary | ICD-10-CM

## 2022-02-25 DIAGNOSIS — E785 Hyperlipidemia, unspecified: Secondary | ICD-10-CM | POA: Diagnosis not present

## 2022-02-25 DIAGNOSIS — E1169 Type 2 diabetes mellitus with other specified complication: Secondary | ICD-10-CM

## 2022-02-25 DIAGNOSIS — N521 Erectile dysfunction due to diseases classified elsewhere: Secondary | ICD-10-CM

## 2022-02-25 DIAGNOSIS — I10 Essential (primary) hypertension: Secondary | ICD-10-CM | POA: Diagnosis not present

## 2022-02-25 DIAGNOSIS — N189 Chronic kidney disease, unspecified: Secondary | ICD-10-CM

## 2022-02-25 LAB — GLUCOSE, POCT (MANUAL RESULT ENTRY): POC Glucose: 171 mg/dl — AB (ref 70–99)

## 2022-02-25 MED ORDER — ONETOUCH VERIO W/DEVICE KIT: PACK | 0 refills | Status: DC

## 2022-02-25 MED ORDER — ATORVASTATIN CALCIUM 20 MG PO TABS: 20.0000 mg | ORAL_TABLET | Freq: Every day | ORAL | 3 refills | Status: DC

## 2022-02-25 MED ORDER — VALSARTAN-HYDROCHLOROTHIAZIDE 160-25 MG PO TABS
1.0000 | ORAL_TABLET | Freq: Every day | ORAL | 3 refills | Status: DC
Start: 2022-02-25 — End: 2022-02-25

## 2022-02-25 MED ORDER — METFORMIN HCL 500 MG PO TABS: 500.0000 mg | ORAL_TABLET | Freq: Two times a day (BID) | ORAL | 3 refills | Status: DC

## 2022-02-25 MED ORDER — AMLODIPINE BESYLATE 10 MG PO TABS: 10.0000 mg | ORAL_TABLET | Freq: Every day | ORAL | 2 refills | Status: DC

## 2022-02-25 MED ORDER — MELOXICAM 7.5 MG PO TABS: 7.5000 mg | ORAL_TABLET | Freq: Two times a day (BID) | ORAL | 2 refills | Status: DC | PRN

## 2022-02-25 MED ORDER — VALSARTAN-HYDROCHLOROTHIAZIDE 160-25 MG PO TABS: 1.0000 | ORAL_TABLET | Freq: Every day | ORAL | 3 refills | Status: DC

## 2022-02-25 MED ORDER — GLUCOSE BLOOD VI STRP: ORAL_STRIP | 12 refills | Status: DC

## 2022-02-25 MED ORDER — ONETOUCH DELICA LANCETS 30G MISC: 2 refills | Status: DC

## 2022-02-25 NOTE — Assessment & Plan Note (Signed)
Patient tolerating Viagra well continue same ?

## 2022-02-25 NOTE — Assessment & Plan Note (Signed)
Hypertension not well controlled plan is to continue amlodipine 10 mg daily discontinue hydrochlorothiazide and begin valsartan HCT 160/25 daily ? ?Patient given a lifestyle medicine handout for diet ? ?Patient return to see clinical pharmacy short-term ?

## 2022-02-25 NOTE — Assessment & Plan Note (Signed)
Hyperlipidemia we will continue the atorvastatin 20 mg daily ?

## 2022-02-25 NOTE — Assessment & Plan Note (Signed)
Monitor while checking renal function ?

## 2022-02-25 NOTE — Assessment & Plan Note (Addendum)
A1c at goal continue with lifestyle medicine handout education and continue metformin ? ?Plan to send patient prescription for testing supplies ?

## 2022-02-25 NOTE — Patient Instructions (Addendum)
Start valsartan hct one daily and STOP hydrochlorthiazide ? ?No other medications changes ? ?Labs : cholesterol, urine for albumen, A1c ? ?Follow healthy diet see handout ? ?See Lurena Joiner our pharmacist for blood pressure 3 weeks ? ?Return to Dr Joya Gaskins 2 months for Blood pressure recheck ?

## 2022-02-26 ENCOUNTER — Ambulatory Visit: Payer: 59 | Attending: Critical Care Medicine

## 2022-02-26 ENCOUNTER — Other Ambulatory Visit: Payer: Self-pay | Admitting: Critical Care Medicine

## 2022-02-26 ENCOUNTER — Encounter: Payer: Self-pay | Admitting: Critical Care Medicine

## 2022-02-26 DIAGNOSIS — Z23 Encounter for immunization: Secondary | ICD-10-CM

## 2022-02-26 LAB — LIPID PANEL
Chol/HDL Ratio: 2.5 ratio (ref 0.0–5.0)
Cholesterol, Total: 145 mg/dL (ref 100–199)
HDL: 57 mg/dL (ref >39)
LDL Chol Calc (NIH): 44 mg/dL (ref 0–99)
Triglycerides: 293 mg/dL — ABNORMAL HIGH (ref 0–149)
VLDL Cholesterol Cal: 44 mg/dL — ABNORMAL HIGH (ref 5–40)

## 2022-02-26 LAB — MICROALBUMIN / CREATININE URINE RATIO
Creatinine, Urine: 318.1 mg/dL
Microalb/Creat Ratio: 6 mg/g creat (ref 0–29)
Microalbumin, Urine: 18.7 ug/mL

## 2022-02-26 LAB — HEMOGLOBIN A1C
Est. average glucose Bld gHb Est-mCnc: 154 mg/dL
Hgb A1c MFr Bld: 7 % — ABNORMAL HIGH (ref 4.8–5.6)

## 2022-02-26 MED ORDER — TRULICITY 0.75 MG/0.5ML ~~LOC~~ SOAJ: 0.7500 mg | SUBCUTANEOUS | 4 refills | Status: DC

## 2022-02-27 ENCOUNTER — Telehealth: Payer: Self-pay

## 2022-02-27 NOTE — Telephone Encounter (Signed)
-----   Message from Elsie Stain, MD sent at 02/26/2022  5:57 AM EDT ----- ?Let pt know A1C has increased.   Cholesterol is at goal, he needs to watch carbohydrate intake, and need to start trulicity one injection weekly  Rx sent to his pharmacy   also make appt with luke in 2-3 weeks  ?

## 2022-02-27 NOTE — Telephone Encounter (Signed)
Pt was called and vm was left, Information has been sent to nurse pool.   

## 2022-02-27 NOTE — Telephone Encounter (Signed)
-----   Message from Elsie Stain, MD sent at 02/26/2022  4:56 PM EDT ----- ?Let patient know urine microalbumin is normal no evidence of diabetes damage to kidneys ?

## 2022-03-12 ENCOUNTER — Telehealth: Payer: Self-pay | Admitting: Critical Care Medicine

## 2022-03-12 NOTE — Telephone Encounter (Signed)
[name]  see below  I sent a mychart msg this pt never responded or read msg ? ?Pls call him to tell him to start trulicity, was sent to his [facility] ? ?Timothy Warren  ?  ?Your A1C is up to 7.0 ?Please watch your diet as we discussed ?  ?I would like to start Trulicity one injection weekly.  This is not insulin but an medication to reduce the amount of carbohydrate your body absorbs from food and to reduce the amount of sugar your liver and fat make. ?  ?I sent a Rx to your [facility] and also I want you to meet with Lurena Joiner our clinical pharmacist for a visit ?

## 2022-03-12 NOTE — Telephone Encounter (Signed)
(  Called and pt is aware of results, DOB was confirmed)  ? ?Timothy Warren he said that he would like help with using the trulicity, he wants an [employer] fast streamer  ?

## 2022-03-13 NOTE — Telephone Encounter (Signed)
Called and left vm  °

## 2022-03-13 NOTE — Telephone Encounter (Signed)
Sounds good friend. He can come by any time I'm in office and let the front know he needs to be taught to use the Trulicity. I am out of the office today, but happy to see him when I return on Thursday or Friday of this week. He doesn't need to be on my schedule, he can just walk up to the front desk and ask to be taught by me. If I am with a patient, someone can let me know via Teams that he is here. I have included Kaitlyn on this thread in case he wants to make an appointment.  ?

## 2022-03-13 NOTE — Telephone Encounter (Signed)
Pt stated he would wait and come see Lurena Joiner for this at his upcoming appointment with him on 05/23.  ?

## 2022-03-13 NOTE — Telephone Encounter (Signed)
Noted  

## 2022-03-26 ENCOUNTER — Encounter: Payer: Self-pay | Admitting: Pharmacist

## 2022-03-26 ENCOUNTER — Ambulatory Visit: Payer: 59 | Attending: Critical Care Medicine | Admitting: Pharmacist

## 2022-03-26 VITALS — BP 104/66 | HR 70

## 2022-03-26 DIAGNOSIS — I10 Essential (primary) hypertension: Secondary | ICD-10-CM

## 2022-03-26 NOTE — Progress Notes (Signed)
S:     No chief complaint on file.   Timothy Warren is a 60 y.o. male who presents for hypertension evaluation, education, and management. PMH is significant for HTN, T2DM, HLD, OA, stage III CKD. Patient was referred and last seen by Primary Care Provider, Dr. Wright, on 02/25/2022. BP was elevated at that appt. Pt was changed from single HCTZ to valsartan-HCTZ.  Today, patient arrives in good spirits and presents without assistance. Denies dizziness, headache, blurred vision, swelling.   Patient reports hypertension is longstanding.   Family/Social history:  Fhx: HTN Tobacco: never smoker Alcohol: none reported   Medication adherence reported with valsartan-HCTZ. Patient has taken this today. Of note, he thought he was supposed to stop amlodipine and has not been taking it.   Current antihypertensives include: valsartan HCTZ 160-25 mg daily, amlodipine 10 mg daily (not taking)  Reported home BP readings: none   Patient reported dietary habits:  -Compliant with salt restriction -Denies any intake of caffeine   Patient-reported exercise habits: walks ~30 minutes to an hour in the evenings with his dog  O:  Vitals:   03/26/22 1633  BP: 104/66  Pulse: 70    Last 3 Office BP readings: BP Readings from Last 3 Encounters:  03/26/22 104/66  02/25/22 (!) 157/80  11/30/21 (!) 131/99    BMET    Component Value Date/Time   NA 139 03/19/2021 1059   K 3.7 03/19/2021 1059   CL 97 03/19/2021 1059   CO2 25 03/19/2021 1059   GLUCOSE 121 (H) 03/19/2021 1059   GLUCOSE 148 (H) 01/17/2020 1225   BUN 25 (H) 03/19/2021 1059   CREATININE 1.82 (H) 03/19/2021 1059   CALCIUM 9.8 03/19/2021 1059   GFRNONAA 49 (L) 08/04/2020 1625   GFRAA 56 (L) 08/04/2020 1625    Renal function: CrCl cannot be calculated (Patient's most recent lab result is older than the maximum 21 days allowed.).  Clinical ASCVD: No  The 10-year ASCVD risk score (Arnett DK, et al., 2019) is: 14.3%   Values used  to calculate the score:     Age: 60 years     Sex: Male     Is Non-Hispanic African American: Yes     Diabetic: Yes     Tobacco smoker: No     Systolic Blood Pressure: 104 mmHg     Is BP treated: Yes     HDL Cholesterol: 57 mg/dL     Total Cholesterol: 145 mg/dL  A/P: Hypertension longstanding currently at goal on current medications. BP goal < 130/80 mmHg. Medication adherence appears suboptimal as he is not taking amlodipine. His BP today is good, borderline low. Will hold amlodipine for now since he isn't taking and have him return in 4 weeks. -Continued valsartan-HCTZ 160-25 mg daily. -Hold amlodipine for now.  -F/u labs ordered - CMP14+eGFR -Counseled on lifestyle modifications for blood pressure control including reduced dietary sodium, increased exercise, adequate sleep. -Encouraged patient to check BP at home and bring log of readings to next visit. Counseled on proper use of home BP cuff.   Results reviewed and written information provided. Patient verbalized understanding of treatment plan. Total time in face-to-face counseling 30 minutes.   F/u clinic visit in 1 month.  Luke Van Ausdall, PharmD, BCACP, CPP Clinical Pharmacist [facility] [phone]   

## 2022-03-27 ENCOUNTER — Ambulatory Visit: Payer: 59 | Admitting: Pharmacist

## 2022-03-27 ENCOUNTER — Telehealth: Payer: Self-pay | Admitting: Pharmacist

## 2022-03-27 ENCOUNTER — Other Ambulatory Visit: Payer: Self-pay | Admitting: Pharmacist

## 2022-03-27 ENCOUNTER — Other Ambulatory Visit: Payer: Self-pay | Admitting: Critical Care Medicine

## 2022-03-27 DIAGNOSIS — I10 Essential (primary) hypertension: Secondary | ICD-10-CM

## 2022-03-27 DIAGNOSIS — N1831 Chronic kidney disease, stage 3a: Secondary | ICD-10-CM

## 2022-03-27 DIAGNOSIS — N179 Acute kidney failure, unspecified: Secondary | ICD-10-CM

## 2022-03-27 LAB — CMP14+EGFR
ALT: 20 IU/L (ref 0–44)
AST: 27 IU/L (ref 0–40)
Albumin/Globulin Ratio: 2 (ref 1.2–2.2)
Albumin: 4.6 g/dL (ref 3.8–4.9)
Alkaline Phosphatase: 60 IU/L (ref 44–121)
BUN/Creatinine Ratio: 14 (ref 9–20)
BUN: 47 mg/dL — ABNORMAL HIGH (ref 6–24)
Bilirubin Total: 0.3 mg/dL (ref 0.0–1.2)
CO2: 23 mmol/L (ref 20–29)
Calcium: 9.2 mg/dL (ref 8.7–10.2)
Chloride: 96 mmol/L (ref 96–106)
Creatinine, Ser: 3.32 mg/dL — ABNORMAL HIGH (ref 0.76–1.27)
Globulin, Total: 2.3 g/dL (ref 1.5–4.5)
Glucose: 116 mg/dL — ABNORMAL HIGH (ref 70–99)
Potassium: 3.8 mmol/L (ref 3.5–5.2)
Sodium: 133 mmol/L — ABNORMAL LOW (ref 134–144)
Total Protein: 6.9 g/dL (ref 6.0–8.5)
eGFR: 21 mL/min/{1.73_m2} — ABNORMAL LOW (ref >59)

## 2022-03-27 MED ORDER — HYDROCHLOROTHIAZIDE 25 MG PO TABS
25.0000 mg | ORAL_TABLET | Freq: Every day | ORAL | 3 refills | Status: DC
  Filled 2022-06-28 – 2022-07-05 (×2): qty 30, 30d supply, fill #0
  Filled 2022-08-09: qty 30, 30d supply, fill #1
  Filled 2022-09-16: qty 30, 30d supply, fill #2
  Filled 2022-10-21: qty 30, 30d supply, fill #3
  Filled 2022-11-15 – 2022-11-25 (×2): qty 30, 30d supply, fill #4

## 2022-03-27 NOTE — Telephone Encounter (Signed)
Left message for patient to give [place] a call back. Saw him in clinic with a BP that was good, borderline low on 03/26/2022. Labs returned that showed decline in renal function. Results discussed with his PCP. We will need him to continue amlodipine, stop valsartan, and restart single agent HCTZ.   Left HIPAA-compliant VM with instructions to return my call. I will keep trying him periodically throughout today.

## 2022-03-27 NOTE — Telephone Encounter (Signed)
Agree and thank you

## 2022-03-27 NOTE — Telephone Encounter (Signed)
Patient returned my call. See below for PCP recommendations in the setting of Scr increase/AKI:   Recommend we stop meloxicam, valsartan HCT. Resume amlodipine and continue hydrochlorothiazide 25 mg daily and I will make a referral to nephrology.  I informed the patient of these changes and encouraged him to adequately hydrate with water. Encouraged him to also avoid alcohol, caffeine, and sugar-sweetened beverages. I sent his HCTZ to his pharmacy. Meloxicam and Diovan-HCT discontinued and pt understands this. He will come by the office tomorrow, and I will discard his Diovan-HCT bottle. Pt verbalized understanding to continue with HCTZ 25 mg daily and amlodipine 10 mg daily for now.

## 2022-03-28 ENCOUNTER — Other Ambulatory Visit: Payer: Self-pay

## 2022-04-29 ENCOUNTER — Encounter: Payer: Self-pay | Admitting: Critical Care Medicine

## 2022-04-29 ENCOUNTER — Ambulatory Visit: Payer: 59 | Attending: Critical Care Medicine | Admitting: Critical Care Medicine

## 2022-04-29 VITALS — BP 127/75 | HR 69 | Wt 159.8 lb

## 2022-04-29 DIAGNOSIS — E119 Type 2 diabetes mellitus without complications: Secondary | ICD-10-CM

## 2022-04-29 DIAGNOSIS — E1169 Type 2 diabetes mellitus with other specified complication: Secondary | ICD-10-CM

## 2022-04-29 DIAGNOSIS — N1831 Chronic kidney disease, stage 3a: Secondary | ICD-10-CM

## 2022-04-29 DIAGNOSIS — I10 Essential (primary) hypertension: Secondary | ICD-10-CM

## 2022-04-29 DIAGNOSIS — E785 Hyperlipidemia, unspecified: Secondary | ICD-10-CM

## 2022-04-29 LAB — GLUCOSE, POCT (MANUAL RESULT ENTRY): POC Glucose: 108 mg/dl — AB (ref 70–99)

## 2022-04-29 NOTE — Assessment & Plan Note (Signed)
Reassess renal function 

## 2022-04-29 NOTE — Assessment & Plan Note (Signed)
Hypertension now at goal plan is to continue amlodipine and hydrochlorothiazide as prescribed  Recheck creatinine is at an increased at the last visit see how he is doing off the ARB

## 2022-04-30 ENCOUNTER — Telehealth: Payer: Self-pay

## 2022-04-30 LAB — RENAL FUNCTION PANEL
Albumin: 4.5 g/dL (ref 3.8–4.9)
BUN/Creatinine Ratio: 12 (ref 9–20)
BUN: 25 mg/dL — ABNORMAL HIGH (ref 6–24)
CO2: 23 mmol/L (ref 20–29)
Calcium: 9.6 mg/dL (ref 8.7–10.2)
Chloride: 94 mmol/L — ABNORMAL LOW (ref 96–106)
Creatinine, Ser: 2.04 mg/dL — ABNORMAL HIGH (ref 0.76–1.27)
Glucose: 87 mg/dL (ref 70–99)
Phosphorus: 4.3 mg/dL — ABNORMAL HIGH (ref 2.8–4.1)
Potassium: 3.3 mmol/L — ABNORMAL LOW (ref 3.5–5.2)
Sodium: 133 mmol/L — ABNORMAL LOW (ref 134–144)
eGFR: 37 mL/min/{1.73_m2} — ABNORMAL LOW (ref >59)

## 2022-04-30 NOTE — Telephone Encounter (Signed)
Pt was called and vm was left, Information has been sent to nurse pool.

## 2022-05-02 ENCOUNTER — Ambulatory Visit: Payer: 59 | Admitting: Pharmacist

## 2022-05-13 ENCOUNTER — Other Ambulatory Visit: Payer: Self-pay | Admitting: Critical Care Medicine

## 2022-05-29 ENCOUNTER — Telehealth: Payer: Self-pay | Admitting: Critical Care Medicine

## 2022-05-29 NOTE — Telephone Encounter (Signed)
Pt called in for assistance. Pt says that he was told by his pharmacy that his Rx for Trulicity is now 919.16. pt says that it is usually 25.00. pt would like to be advised further? Pt says medication is now to expensive.      [id number], [place] Phone:  [phone]  Fax:  [phone]      [phone]- please advise

## 2022-05-30 ENCOUNTER — Other Ambulatory Visit: Payer: Self-pay | Admitting: Pharmacist

## 2022-05-30 MED ORDER — TRULICITY 0.75 MG/0.5ML ~~LOC~~ SOAJ
0.7500 mg | SUBCUTANEOUS | 4 refills | Status: DC
  Filled 2022-05-30 – 2022-06-10 (×4): qty 2, 28d supply, fill #0
  Filled 2022-07-19: qty 2, 28d supply, fill #1
  Filled 2022-08-29 (×2): qty 2, 28d supply, fill #2

## 2022-05-30 NOTE — Telephone Encounter (Signed)
Timothy Warren I know Friday health can be an issue and it is going away.  Your thoughts on alternative?

## 2022-05-31 ENCOUNTER — Other Ambulatory Visit: Payer: Self-pay

## 2022-05-31 NOTE — Telephone Encounter (Signed)
Thank you both. So the patient is aware?

## 2022-06-06 ENCOUNTER — Other Ambulatory Visit: Payer: Self-pay

## 2022-06-07 ENCOUNTER — Other Ambulatory Visit: Payer: Self-pay

## 2022-06-10 ENCOUNTER — Other Ambulatory Visit: Payer: Self-pay

## 2022-06-11 ENCOUNTER — Other Ambulatory Visit: Payer: Self-pay

## 2022-06-11 ENCOUNTER — Telehealth: Payer: Self-pay | Admitting: Emergency Medicine

## 2022-06-11 ENCOUNTER — Ambulatory Visit: Payer: 59 | Attending: Critical Care Medicine | Admitting: Pharmacist

## 2022-06-11 VITALS — BP 108/67 | HR 62

## 2022-06-11 DIAGNOSIS — I10 Essential (primary) hypertension: Secondary | ICD-10-CM

## 2022-06-11 DIAGNOSIS — E119 Type 2 diabetes mellitus without complications: Secondary | ICD-10-CM | POA: Diagnosis not present

## 2022-06-11 DIAGNOSIS — N189 Chronic kidney disease, unspecified: Secondary | ICD-10-CM | POA: Diagnosis not present

## 2022-06-11 LAB — POCT GLYCOSYLATED HEMOGLOBIN (HGB A1C): HbA1c, POC (controlled diabetic range): 6.2 % (ref 0.0–7.0)

## 2022-06-11 MED ORDER — ONETOUCH VERIO W/DEVICE KIT
PACK | 0 refills | Status: AC
  Filled 2022-06-11 – 2022-06-26 (×2): qty 1, 30d supply, fill #0

## 2022-06-11 MED ORDER — ATORVASTATIN CALCIUM 20 MG PO TABS
20.0000 mg | ORAL_TABLET | Freq: Every day | ORAL | 3 refills | Status: DC
  Filled 2022-06-11: qty 30, 30d supply, fill #0
  Filled 2022-06-28 – 2022-07-05 (×2): qty 30, 30d supply, fill #1
  Filled 2022-08-20: qty 30, 30d supply, fill #2
  Filled 2022-09-16: qty 30, 30d supply, fill #3
  Filled 2022-10-21: qty 30, 30d supply, fill #4
  Filled 2022-11-15 – 2022-11-25 (×2): qty 30, 30d supply, fill #5

## 2022-06-11 MED ORDER — ONETOUCH VERIO VI STRP
ORAL_STRIP | 2 refills | Status: DC
  Filled 2022-06-11: qty 50, 25d supply, fill #0
  Filled 2022-06-25: qty 100, 30d supply, fill #0
  Filled 2023-01-30: qty 100, 30d supply, fill #1
  Filled 2023-03-03 – 2023-05-06 (×2): qty 100, 30d supply, fill #2

## 2022-06-11 MED ORDER — METFORMIN HCL 500 MG PO TABS
500.0000 mg | ORAL_TABLET | Freq: Two times a day (BID) | ORAL | 3 refills | Status: DC
  Filled 2022-06-11: qty 60, 30d supply, fill #0
  Filled 2022-08-20: qty 60, 30d supply, fill #1
  Filled 2022-10-21: qty 60, 30d supply, fill #2
  Filled 2022-11-25: qty 60, 30d supply, fill #3

## 2022-06-11 MED ORDER — AMLODIPINE BESYLATE 10 MG PO TABS
10.0000 mg | ORAL_TABLET | Freq: Every day | ORAL | 2 refills | Status: DC
  Filled 2022-06-11: qty 30, 30d supply, fill #0
  Filled 2022-06-28 – 2022-07-05 (×2): qty 30, 30d supply, fill #1
  Filled 2022-08-20: qty 30, 30d supply, fill #2
  Filled 2022-09-16: qty 30, 30d supply, fill #3
  Filled 2022-10-21: qty 30, 30d supply, fill #4
  Filled 2022-11-15 – 2022-11-25 (×2): qty 30, 30d supply, fill #5

## 2022-06-11 NOTE — Telephone Encounter (Signed)
Copied from [place] [id number]. Topic: General - Other >> Jun 11, 2022 12:47 PM Leitha Schuller wrote: Patient making Timothy Warren aware he did not got to the pharmacy to pick up test strips due to not having the funds to pay for the rx

## 2022-06-11 NOTE — Progress Notes (Signed)
S:     No chief complaint on file.   Timothy Warren is a 61 y.o. male who presents for hypertension and diabetes evaluation, education, and management. PMH is significant for HTN, T2DM, HLD, OA, stage III CKD. Patient was referred and last seen by Primary Care Provider, Dr. Joya Gaskins, on 04/29/2022.   Today, patient arrives in good spirits and presents without assistance. Denies dizziness, headache, blurred vision, swelling. Patient reports hypertension and diabetes is longstanding. His control has improved over the last couple of months.  Family/Social history:  Fhx: HTN Tobacco: never smoker Alcohol: 1 shot during the weekend  Medication adherence reported to all medications except Trulicity. He has been having issues affording the copay but was unaware that the medication can be filled at our pharmacy.   Current HTN medications include: amlodipine 10 mg daily, HCTZ 25 mg daily Current diabetes medications include: metformin 144 mg BID, Trulicity 3.15 mg weekly Current hyperlipidemia medications include: atorvastatin 20 mg daily  Patient denies hypoglycemic events.  Reported home fasting blood sugars: not checking d/t no test strips   Reported 2 hour post-meal/random blood sugars: not checking d/t test strips.  Reported home BP readings: 120s/60s-70s.   Patient denies nocturia (nighttime urination).  Patient reports neuropathy (nerve pain). Patient denies visual changes. Patient reports self foot exams.   Patient reported dietary habits:  -Compliant with salt restriction -Denies any intake of caffeine  -Compliant with sweet restriction, limits fast foods   Patient-reported exercise habits: walks ~30 minutes to an hour in the evenings with his dog -push-ups, squats occasionally   O:  Vitals:   06/11/22 1056  BP: 108/67  Pulse: 62   Last 3 Office BP readings: BP Readings from Last 3 Encounters:  06/11/22 108/67  04/29/22 127/75  03/26/22 104/66   BMET    Component  Value Date/Time   NA 133 (L) 04/29/2022 1625   K 3.3 (L) 04/29/2022 1625   CL 94 (L) 04/29/2022 1625   CO2 23 04/29/2022 1625   GLUCOSE 87 04/29/2022 1625   GLUCOSE 148 (H) 01/17/2020 1225   BUN 25 (H) 04/29/2022 1625   CREATININE 2.04 (H) 04/29/2022 1625   CALCIUM 9.6 04/29/2022 1625   GFRNONAA 49 (L) 08/04/2020 1625   GFRAA 56 (L) 08/04/2020 1625    Lab Results  Component Value Date   HGBA1C 6.2 06/11/2022   Lipid Panel     Component Value Date/Time   CHOL 145 02/25/2022 1456   TRIG 293 (H) 02/25/2022 1456   HDL 57 02/25/2022 1456   CHOLHDL 2.5 02/25/2022 1456   LDLCALC 44 02/25/2022 1456    Clinical Atherosclerotic Cardiovascular Disease (ASCVD): No  The 10-year ASCVD risk score (Arnett DK, et al., 2019) is: 15.9%   Values used to calculate the score:     Age: 49 years     Sex: Male     Is Non-Hispanic African American: Yes     Diabetic: Yes     Tobacco smoker: No     Systolic Blood Pressure: 400 mmHg     Is BP treated: Yes     HDL Cholesterol: 57 mg/dL     Total Cholesterol: 145 mg/dL   A/P: Diabetes longstanding currently controlled. Commended patient for this. Patient is able to verbalize appropriate hypoglycemia management plan. Medication adherence appears suboptimal, however, he is agreeable to filling the Trulicity here. -Restarted Trulicity 8.67 mg weekly. Rx sent to our pharmacy.  -Continued metformin 500 mg BID.  -Extensively discussed pathophysiology of  diabetes, recommended lifestyle interventions, dietary effects on blood sugar control.  -Counseled on s/sx of and management of hypoglycemia.  -Next A1c anticipated 09/2022.   Hypertension longstanding currently at goal on current medications. BP goal < 130/80 mmHg. Medication adherence is good. -Continue amlodipine 10 mg daily.  -Continue HCTZ 25 mg daily.  -Counseled on lifestyle modifications for blood pressure control including reduced dietary sodium, increased exercise, adequate  sleep. -Encouraged patient to check BP at home and bring log of readings to next visit. Counseled on proper use of home BP cuff.   Results reviewed and written information provided. Patient verbalized understanding of treatment plan. Total time in face-to-face counseling 30 minutes.   F/u clinic visit in 1 month.  Benard Halsted, PharmD, [facility] [place], [place] [phone]

## 2022-06-11 NOTE — Telephone Encounter (Signed)
Thank you for letting me know

## 2022-06-13 ENCOUNTER — Encounter: Payer: Self-pay | Admitting: Physician Assistant

## 2022-06-13 ENCOUNTER — Ambulatory Visit: Payer: 59 | Attending: Physician Assistant | Admitting: Physician Assistant

## 2022-06-13 ENCOUNTER — Other Ambulatory Visit: Payer: Self-pay

## 2022-06-13 VITALS — BP 120/72 | HR 59 | Ht 69.0 in | Wt 157.0 lb

## 2022-06-13 DIAGNOSIS — E119 Type 2 diabetes mellitus without complications: Secondary | ICD-10-CM

## 2022-06-13 DIAGNOSIS — M79672 Pain in left foot: Secondary | ICD-10-CM | POA: Diagnosis not present

## 2022-06-13 DIAGNOSIS — M109 Gout, unspecified: Secondary | ICD-10-CM

## 2022-06-13 LAB — GLUCOSE, POCT (MANUAL RESULT ENTRY): POC Glucose: 108 mg/dl — AB (ref 70–99)

## 2022-06-13 MED ORDER — COLCHICINE 0.6 MG PO TABS
ORAL_TABLET | ORAL | 0 refills | Status: DC
  Filled 2022-06-13: qty 60, 30d supply, fill #0

## 2022-06-13 NOTE — Progress Notes (Signed)
Patient ID: Timothy Warren, male   DOB: Mar 21, 1961, 61 y.o.   MRN: [id number]     Timothy Warren, is a 61 y.o. male  VQM:[id number]  [id number]:[id number]  DOB - 12/15/1960  Chief Complaint  Patient presents with   Foot Swelling    Both feet       Subjective:   Timothy Warren is a 61 y.o. male here today for pain in L MTP.  He has never had a gout flare.  Says it is swollen and tender to touch or bear weight.  Eats a lot of meet, cheeses, and drinks alcohol.     Onset: 4 days ago Location: Left great MTP Duration:  4 days Characterization/quality:  burning/aching Alleviating/aggravating: nothing Radiation: whole foot hurts Severity: moderate  No problems updated.  ALLERGIES: No Known Allergies  PAST MEDICAL HISTORY: Past Medical History:  Diagnosis Date   Allergy    Chronic kidney disease    stage 2 CKD   Diabetes mellitus without complication ([place])    Hyperlipidemia    Hypertension    Seasonal allergies     MEDICATIONS AT HOME: Prior to Admission medications   Medication Sig Start Date End Date Taking? Authorizing Provider  amLODipine (NORVASC) 10 MG tablet Take 1 tablet (10 mg total) by mouth daily. To lower blood pressure 06/11/22  Yes Elsie Stain, MD  atorvastatin (LIPITOR) 20 MG tablet Take 1 tablet (20 mg total) by mouth daily. 06/11/22  Yes Elsie Stain, MD  Blood Glucose Monitoring Suppl [facility] VERIO) w/Device KIT Use to check blood sugar twice a day 06/11/22  Yes Elsie Stain, MD  colchicine 0.6 MG tablet Take 2 at onset of gout.  Then take 1 tablet twice daily for 2 days prn gout flare 06/13/22  Yes Marci Polito M, PA-C  Dulaglutide (TRULICITY) 9.83 JA/2.5KN SOPN Inject 0.75 mg into the skin once a week. 05/30/22  Yes Elsie Stain, MD  glucose blood (ONETOUCH VERIO) test strip Use to check blood sugar twice daily. 06/11/22  Yes Elsie Stain, MD  hydrochlorothiazide (HYDRODIURIL) 25 MG tablet Take 1 tablet (25 mg total) by mouth daily. 03/27/22   Yes Elsie Stain, MD  metFORMIN (GLUCOPHAGE) 500 MG tablet Take 1 tablet (500 mg total) by mouth 2 (two) times daily with a meal. 06/11/22  Yes Elsie Stain, MD  OneTouch Delica Lancets 39J MISC Check blood sugar twice a day 02/25/22  Yes Elsie Stain, MD  sildenafil (VIAGRA) 50 MG tablet TAKE 1 TABLET (50 MG TOTAL) BY MOUTH DAILY AS NEEDED FOR ERECTILE DYSFUNCTION. 05/13/22  Yes Elsie Stain, MD    ROS: Neg HEENT Neg resp Neg cardiac Neg GI Neg GU Neg psych Neg neuro  Objective:   Vitals:   06/13/22 1000  BP: 120/72  Pulse: (!) 59  SpO2: 100%  Weight: 157 lb (71.2 kg)  Height: '5\' 9"'  (1.753 m)   Exam General appearance : Awake, alert, not in any distress. Speech Clear. Not toxic looking HEENT: Atraumatic and Normocephalic Neck: Supple, no JVD. No cervical lymphadenopathy.  Chest: Good air entry bilaterally, CTAB.  No rales/rhonchi/wheezing CVS: S1 S2 regular, no murmurs.  L foot with erythema at base 1st MTP, tender with passive ROM and TTP.  No streaking or sign of infection/ broken skin Neurology: Awake alert, and oriented X 3, CN II-XII intact, Non focal Skin: No Rash  Data Review Lab Results  Component Value Date   HGBA1C 6.2 06/11/2022   HGBA1C 7.0 (  H) 02/25/2022   HGBA1C 6.6 10/01/2021    Assessment & Plan   1. Type 2 diabetes mellitus without complication, without long-term current use of insulin (HCC) A1C 2 days ago 6.2.  continue current regmien - POCT glucose (manual entry)  2. Left foot pain - Uric Acid - colchicine 0.6 MG tablet; Take 2 at onset of gout.  Then take 1 tablet twice daily for 2 days prn gout flare  Dispense: 60 tablet; Refill: 0  3. Acute gout involving toe of left foot, unspecified cause - Uric Acid - CBC with Differential/Platelet - colchicine 0.6 MG tablet; Take 2 at onset of gout.  Then take 1 tablet twice daily for 2 days prn gout flare  Dispense: 60 tablet; Refill: 0    Return for keep september appt with Dr  Joya Gaskins.  The patient was given clear instructions to go to ER or return to medical center if symptoms don't improve, worsen or new problems develop. The patient verbalized understanding. The patient was told to call to get lab results if they haven't heard anything in the next week.      Freeman Caldron, PA-C [facility] [place], [place]   06/13/2022, 10:15 AM

## 2022-06-13 NOTE — Patient Instructions (Signed)
Drink 80-100 ounces water daily  Gout  Gout is painful swelling of your joints. Gout is a type of arthritis. It is caused by having too much uric acid in your body. Uric acid is a chemical that is made when your body breaks down substances called purines. If your body has too much uric acid, sharp crystals can form and build up in your joints. This causes pain and swelling. Gout attacks can happen quickly and be very painful (acute gout). Over time, the attacks can affect more joints and happen more often (chronic gout). What are the causes? Gout is caused by too much uric acid in your blood. This can happen because: Your kidneys do not remove enough uric acid from your blood. Your body makes too much uric acid. You eat too many foods that are high in purines. These foods include organ meats, some seafood, and beer. Trauma or stress can bring on an attack. What increases the risk? Having a family history of gout. Being male and middle-aged. Being male and having gone through menopause. Having an organ transplant. Taking certain medicines. Having certain conditions, such as: Being very overweight (obese). Lead poisoning. Kidney disease. A skin condition called psoriasis. Other risks include: Losing weight too quickly. Not having enough water in the body (being dehydrated). Drinking alcohol, especially beer. Drinking beverages that are sweetened with a type of sugar called fructose. What are the signs or symptoms? An attack of acute gout often starts at night and usually happens in just one joint. The most common place is the big toe. Other joints that may be affected include joints of the feet, ankle, knee, fingers, wrist, or elbow. Symptoms may include: Very bad pain. Warmth. Swelling. Stiffness. Tenderness. The affected joint may be very painful to touch. Shiny, red, or purple skin. Chills and fever. Chronic gout may cause symptoms more often. More joints may be involved. You  may also have white or yellow lumps (tophi) on your hands or feet or in other areas near your joints. How is this treated? Treatment for an acute attack may include medicines for pain and swelling, such as: NSAIDs, such as ibuprofen. Steroids taken by mouth or injected into a joint. Colchicine. This can be given by mouth or through an IV tube. Treatment to prevent future attacks may include: Taking small doses of NSAIDs or colchicine daily. Using a medicine that reduces uric acid levels in your blood, such as allopurinol. Making changes to your diet. You may need to see a food expert (dietitian) about what to eat and drink to prevent gout. Follow these instructions at home: During a gout attack  If told, put ice on the painful area. To do this: Put ice in a plastic bag. Place a towel between your skin and the bag. Leave the ice on for 20 minutes, 2-3 times a day. Take off the ice if your skin turns bright red. This is very important. If you cannot feel pain, heat, or cold, you have a greater risk of damage to the area. Raise the painful joint above the level of your heart as often as you can. Rest the joint as much as possible. If the joint is in your leg, you may be given crutches. Follow instructions from your doctor about what you cannot eat or drink. Avoiding future gout attacks Eat a low-purine diet. Avoid foods and drinks such as: Liver. Kidney. Anchovies. Asparagus. Herring. Mushrooms. Mussels. Beer. Stay at a healthy weight. If you want to lose weight,  talk with your doctor. Do not lose weight too fast. Start or continue an exercise plan as told by your doctor. Eating and drinking Avoid drinks sweetened by fructose. Drink enough fluids to keep your pee (urine) pale yellow. If you drink alcohol: Limit how much you have to: 0-1 drink a day for women who are not pregnant. 0-2 drinks a day for men. Know how much alcohol is in a drink. In the [place] one drink equals one 12  oz bottle of beer (355 mL), one 5 oz glass of wine (148 mL), or one 1 oz glass of hard liquor (44 mL). General instructions Take over-the-counter and prescription medicines only as told by your doctor. Ask your doctor if you should avoid driving or using machines while you are taking your medicine. Return to your normal activities when your doctor says that it is safe. Keep all follow-up visits. Where to find more information [employer] of [employer]: www.niams.SouthExposed.es Contact a doctor if: You have another gout attack. You still have symptoms of a gout attack after 10 days of treatment. You have problems (side effects) because of your medicines. You have chills or a fever. You have burning pain when you pee (urinate). You have pain in your lower back or belly. Get help right away if: You have very bad pain. Your pain cannot be controlled. You cannot pee. Summary Gout is painful swelling of the joints. The most common site of pain is the big toe, but it can affect other joints. Medicines and avoiding some foods can help to prevent and treat gout attacks. This information is not intended to replace advice given to you by your health care provider. Make sure you discuss any questions you have with your health care provider. Document Revised: 07/25/2021 Document Reviewed: 07/25/2021 Elsevier Patient Education  [place].

## 2022-06-14 LAB — CBC WITH DIFFERENTIAL/PLATELET
Basophils Absolute: 0.1 10*3/uL (ref 0.0–0.2)
Basos: 1 %
EOS (ABSOLUTE): 0.4 10*3/uL (ref 0.0–0.4)
Eos: 5 %
Hematocrit: 38.5 % (ref 37.5–51.0)
Hemoglobin: 12.7 g/dL — ABNORMAL LOW (ref 13.0–17.7)
Immature Grans (Abs): 0 10*3/uL (ref 0.0–0.1)
Immature Granulocytes: 0 %
Lymphocytes Absolute: 2 10*3/uL (ref 0.7–3.1)
Lymphs: 24 %
MCH: 27.3 pg (ref 26.6–33.0)
MCHC: 33 g/dL (ref 31.5–35.7)
MCV: 83 fL (ref 79–97)
Monocytes Absolute: 0.8 10*3/uL (ref 0.1–0.9)
Monocytes: 9 %
Neutrophils Absolute: 5.3 10*3/uL (ref 1.4–7.0)
Neutrophils: 61 %
Platelets: 268 10*3/uL (ref 150–450)
RBC: 4.66 x10E6/uL (ref 4.14–5.80)
RDW: 13.1 % (ref 11.6–15.4)
WBC: 8.6 10*3/uL (ref 3.4–10.8)

## 2022-06-14 LAB — URIC ACID: Uric Acid: 8.5 mg/dL — ABNORMAL HIGH (ref 3.8–8.4)

## 2022-06-18 ENCOUNTER — Other Ambulatory Visit: Payer: Self-pay

## 2022-06-25 ENCOUNTER — Other Ambulatory Visit: Payer: Self-pay

## 2022-06-26 ENCOUNTER — Other Ambulatory Visit: Payer: Self-pay

## 2022-06-28 ENCOUNTER — Other Ambulatory Visit: Payer: Self-pay

## 2022-07-04 ENCOUNTER — Telehealth: Payer: Self-pay | Admitting: Orthopedic Surgery

## 2022-07-04 NOTE — Telephone Encounter (Signed)
Patient came in needing a note for work stating restrictions if any job will not let him work until he gets the note

## 2022-07-04 NOTE — Telephone Encounter (Signed)
Patient not seen by Dr Dean/Luke since March. Needs OV before work release or restrictions can be provided. LMVM for patient to call our office to schedule.

## 2022-07-05 ENCOUNTER — Other Ambulatory Visit: Payer: Self-pay

## 2022-07-09 ENCOUNTER — Other Ambulatory Visit: Payer: Self-pay

## 2022-07-09 ENCOUNTER — Encounter (HOSPITAL_BASED_OUTPATIENT_CLINIC_OR_DEPARTMENT_OTHER): Payer: Self-pay | Admitting: Obstetrics and Gynecology

## 2022-07-09 ENCOUNTER — Emergency Department (HOSPITAL_BASED_OUTPATIENT_CLINIC_OR_DEPARTMENT_OTHER)
Admission: EM | Admit: 2022-07-09 | Discharge: 2022-07-09 | Disposition: A | Discharge status: Home / Self Care | Payer: 59 | Attending: Emergency Medicine | Admitting: Emergency Medicine

## 2022-07-09 DIAGNOSIS — B349 Viral infection, unspecified: Secondary | ICD-10-CM | POA: Diagnosis not present

## 2022-07-09 DIAGNOSIS — Z20822 Contact with and (suspected) exposure to covid-19: Secondary | ICD-10-CM | POA: Insufficient documentation

## 2022-07-09 DIAGNOSIS — E1122 Type 2 diabetes mellitus with diabetic chronic kidney disease: Secondary | ICD-10-CM | POA: Insufficient documentation

## 2022-07-09 DIAGNOSIS — I129 Hypertensive chronic kidney disease with stage 1 through stage 4 chronic kidney disease, or unspecified chronic kidney disease: Secondary | ICD-10-CM | POA: Insufficient documentation

## 2022-07-09 DIAGNOSIS — N189 Chronic kidney disease, unspecified: Secondary | ICD-10-CM | POA: Insufficient documentation

## 2022-07-09 DIAGNOSIS — R197 Diarrhea, unspecified: Secondary | ICD-10-CM | POA: Diagnosis not present

## 2022-07-09 LAB — SARS CORONAVIRUS 2 BY RT PCR: SARS Coronavirus 2 by RT PCR: NEGATIVE

## 2022-07-09 NOTE — ED Provider Notes (Signed)
[facility] EMERGENCY DEPT Provider Note   CSN: [id number] Arrival date & time: 07/09/22  1104     History  Chief Complaint  Patient presents with   Generalized Body Aches         Timothy Warren is a 61 y.o. male with medical history consisting of CKD, diabetes, hypertension.  Patient presents to ED for evaluation of body aches and chills, diarrhea for the last 2 days.  Patient states that on Sunday he was at a family reunion and then on Monday morning developed body aches and chills along with an episode of diarrhea.  Patient denies any known sick contacts, denies any concern for viral outbreak at family reunion.  Patient denies take any over-the-counter medication for symptoms prior to arrival.  Patient denies any fevers, nausea, vomiting, dysuria, shortness of breath, chest pain.  Patient denies any sore throat.  HPI     Home Medications Prior to Admission medications   Medication Sig Start Date End Date Taking? Authorizing Provider  amLODipine (NORVASC) 10 MG tablet Take 1 tablet (10 mg total) by mouth daily. To lower blood pressure 06/11/22   Elsie Stain, MD  atorvastatin (LIPITOR) 20 MG tablet Take 1 tablet (20 mg total) by mouth daily. 06/11/22   Elsie Stain, MD  Blood Glucose Monitoring Suppl [facility] VERIO) w/Device KIT Use to check blood sugar twice a day 06/11/22   Elsie Stain, MD  colchicine 0.6 MG tablet Take 2 at onset of gout.  Then take 1 tablet twice daily for 2 days prn gout flare 06/13/22   Argentina Donovan, PA-C  Dulaglutide (TRULICITY) 8.67 YP/9.5KD SOPN Inject 0.75 mg into the skin once a week. 05/30/22   Elsie Stain, MD  glucose blood (ONETOUCH VERIO) test strip Use to check blood sugar twice daily. 06/11/22   Elsie Stain, MD  hydrochlorothiazide (HYDRODIURIL) 25 MG tablet Take 1 tablet (25 mg total) by mouth daily. 03/27/22   Elsie Stain, MD  metFORMIN (GLUCOPHAGE) 500 MG tablet Take 1 tablet (500 mg total) by mouth 2  (two) times daily with a meal. 06/11/22   Elsie Stain, MD  OneTouch Delica Lancets 32I MISC Check blood sugar twice a day 02/25/22   Elsie Stain, MD  sildenafil (VIAGRA) 50 MG tablet TAKE 1 TABLET (50 MG TOTAL) BY MOUTH DAILY AS NEEDED FOR ERECTILE DYSFUNCTION. 05/13/22   Elsie Stain, MD      Allergies    Patient has no known allergies.    Review of Systems   Review of Systems  Constitutional:  Positive for chills. Negative for fever.  HENT:  Negative for sore throat.   Respiratory:  Negative for shortness of breath.   Cardiovascular:  Negative for chest pain.  Gastrointestinal:  Positive for diarrhea. Negative for abdominal pain, nausea and vomiting.  Genitourinary:  Negative for dysuria.  Musculoskeletal:  Positive for myalgias.  All other systems reviewed and are negative.   Physical Exam Updated Vital Signs BP 112/79 (BP Location: Right Arm)   Pulse 75   Temp 97.9 F (36.6 C)   Resp 16   Ht '5\' 9"'  (1.753 m)   Wt 69.9 kg   SpO2 100%   BMI 22.74 kg/m  Physical Exam Vitals and nursing note reviewed.  Constitutional:      General: He is not in acute distress.    Appearance: Normal appearance. He is not ill-appearing, toxic-appearing or diaphoretic.  HENT:     Head: Normocephalic  and atraumatic.     Nose: Nose normal. No congestion.     Mouth/Throat:     Mouth: Mucous membranes are moist.     Pharynx: Oropharynx is clear. No oropharyngeal exudate or posterior oropharyngeal erythema.  Eyes:     General:        Right eye: No discharge.        Left eye: No discharge.     Extraocular Movements: Extraocular movements intact.     Pupils: Pupils are equal, round, and reactive to light.  Cardiovascular:     Rate and Rhythm: Normal rate and regular rhythm.  Pulmonary:     Effort: Pulmonary effort is normal.     Breath sounds: Normal breath sounds. No wheezing.  Abdominal:     General: Abdomen is flat.     Palpations: Abdomen is soft.     Tenderness: There  is no abdominal tenderness.  Musculoskeletal:     Cervical back: Normal range of motion and neck supple. No tenderness.  Skin:    General: Skin is warm and dry.     Capillary Refill: Capillary refill takes less than 2 seconds.  Neurological:     Mental Status: He is alert and oriented to person, place, and time.     ED Results / Procedures / Treatments   Labs (all labs ordered are listed, but only abnormal results are displayed) Labs Reviewed  SARS CORONAVIRUS 2 BY RT PCR    EKG None  Radiology No results found.  Procedures Procedures   Medications Ordered in ED Medications - No data to display  ED Course/ Medical Decision Making/ A&P                           Medical Decision Making  61 year old male presents to ED for evaluation.  Please see HPI for further details.  On examination the patient is afebrile and nontachycardic.  Patient lung sounds are clear bilaterally, he is not hypoxic.  The patient abdomen is soft and compressible, there is no tenderness elicited.  Patient neurological examination shows no focal neurodeficits.  Patient viral testing for coronavirus is negative.  Patient requesting work note, one has been provided.  At this point, most likely cause of patient symptoms due to URI.  Patient will be advised to treat symptomatically at home and advised to follow-up with his PCP.  The patient was given return precautions and he voiced understanding of these.  The patient had all of his questions answered his satisfaction prior to discharge.  The patient is stable this time for discharge home.    Final Clinical Impression(s) / ED Diagnoses Final diagnoses:  Viral illness    Rx / DC Orders ED Discharge Orders     None         Lawana Chambers 07/09/22 1256    Blanchie Dessert, MD 07/10/22 1132

## 2022-07-09 NOTE — ED Triage Notes (Signed)
Patient reports to the ER for body aches. Patient reports the pain has been ongoing for a few days. Reports he had Family reunion on Sunday and developed diarrhea. Patient reports the body aches started monday

## 2022-07-09 NOTE — ED Notes (Signed)
Patient given discharge instructions. Questions were answered. Patient verbalized understanding of discharge instructions and care at home.  

## 2022-07-09 NOTE — Discharge Instructions (Signed)
Please return to the ED with any new or worsening signs or symptoms Please follow-up with your PCP Please continue treating your symptoms at home symptomatically.  You may take Tylenol for body aches and chills, fevers.  Please continue hydrating with Pedialyte mixed with water.  Please rest also to allow your body time to recover. Please read attached guide concerning viral illnesses in adults Please see attached work note

## 2022-07-11 ENCOUNTER — Encounter: Payer: Self-pay | Admitting: Surgical

## 2022-07-11 ENCOUNTER — Ambulatory Visit (INDEPENDENT_AMBULATORY_CARE_PROVIDER_SITE_OTHER): Payer: 59 | Admitting: Surgical

## 2022-07-11 DIAGNOSIS — M19012 Primary osteoarthritis, left shoulder: Secondary | ICD-10-CM

## 2022-07-11 NOTE — Progress Notes (Signed)
Office Visit Note   Patient: Timothy Warren           Date of Birth: 28-Jun-1961           MRN: [id number] Visit Date: 07/11/2022 Requested by: Elsie Stain, MD [address] [place],  [place] [address] PCP: Elsie Stain, MD  Subjective: Chief Complaint  Patient presents with   Left Shoulder - Pain    HPI: Timothy Warren is a 61 y.o. male who presents to the office complaining of left shoulder pain.  Patient has history of osteoarthritis of the left glenohumeral joint.  He was last seen earlier this year and was set up for glenohumeral injection with Timothy Warren on 01/28/2022 that gave him several weeks of decent relief but nothing longer lasting than that.  He describes shoulder pain localized to the lateral aspect of the shoulder that will occasional radiate into his trap.  No scapular pain consistently.  No axial neck pain.  No radicular pain down the arm or any radiation of pain past the lateral aspect of the shoulder.  Wakes with pain every night.  No history of neck or shoulder surgery.  No right-sided symptoms and he is right-hand dominant.  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  He occasionally enjoys weightlifting walking his Rottweiler dog and trying to do push-ups.  States that he is fairly healthy with no blood thinner use.  Has a history of diabetes with last A1c around 6.0.  Was diagnosed with kidney disease before but states that this is resolved now.  Does not smoke cigarettes but does occasionally use marijuana..                ROS: All systems reviewed are negative as they relate to the chief complaint within the history of present illness.  Patient denies fevers or chills.  Assessment & Plan: Visit Diagnoses:  1. Primary osteoarthritis, left shoulder     Plan: Patient is a 61 year old male who presents for evaluation of left shoulder pain.  He has history of left shoulder osteoarthritis that  has been giving him pain for several years.  Glenohumeral injection in March 2023 gave him only a couple weeks of relief before recurrence of symptoms.  He wakes with pain every night.  Most of his pain is localized to the lateral aspect of the shoulder.  Had long discussion with Timothy Warren about potential options for him including living with his pain versus changing his line of work versus repeating glenohumeral injection versus MRI to see if there is anything that is potentially arthroscopically treatable or treatable with mini open approach versus proceeding with left shoulder replacement.  After discussion, patient would like to proceed with planning for left shoulder arthroplasty.  He understands that there is a lifting restriction for shoulder placements and he would likely benefit from looking for a different line of work in general.  Discussed the surgical process as well as the recovery timeframe and some of the risks and benefits of the procedure but did not have full surgical discussion with him yet.  This will come after he returns to discuss his preoperative CT scan with Timothy Warren.  Ordered thin cut CT scan of the left shoulder for preoperative planning purposes.  Follow-Up Instructions: No follow-ups on file.   Orders:  Orders Placed This Encounter  Procedures   CT SHOULDER LEFT WO CONTRAST   No orders  of the defined types were placed in this encounter.     Procedures: No procedures performed   Clinical Data: No additional findings.  Objective: Vital Signs: There were no vitals taken for this visit.  Physical Exam:  Constitutional: Patient appears well-developed HEENT:  Head: Normocephalic Eyes:EOM are normal Neck: Normal range of motion Cardiovascular: Normal rate Pulmonary/chest: Effort normal Neurologic: Patient is alert Skin: Skin is warm Psychiatric: Patient has normal mood and affect  Ortho Exam: Ortho exam demonstrates left shoulder with 35 degrees external rotation,  75 degrees abduction, 135 degrees forward flexion.  This compared with right shoulder 50 degrees X rotation, 120 degrees abduction, 170 degrees forward flexion.  There is coarse grinding noted in the left shoulder consistent with osteoarthritis.  Axillary nerve is intact with deltoid firing.  Intact EPL, grip strength testing, pronation/supination, finger abduction, bicep flexion, tricep extension.  Excellent rotator cuff strength of supraspinatus and subscapularis rated 5/5.  5 -/5 infraspinatus strength that seems mostly limited by pain.  Moderate tenderness over the bicipital groove.  No tenderness over the [facility] joint.  Negative Spurling sign.  Negative Lhermitte sign.  No pain with cervical spine range of motion.  No tenderness over the axial cervical spine.  Negative external rotation lag sign.  Negative Hornblower sign.  Specialty Comments:  No specialty comments available.  Imaging: No results found.   PMFS History: Patient Active Problem List   Diagnosis Date Noted   Primary osteoarthritis, left shoulder 11/13/2021   Chronic right shoulder pain 10/16/2021   Hyperlipidemia due to type 2 diabetes mellitus ([place]) 05/28/2021   Type 2 diabetes mellitus without complication, without long-term current use of insulin ([place]) 08/12/2020   Stage 3a chronic kidney disease ([place]) 08/12/2020   Erectile dysfunction associated with type 2 diabetes mellitus ([place]) 08/12/2020   Hypertension 02/17/2020   Past Medical History:  Diagnosis Date   Allergy    Chronic kidney disease    stage 2 CKD   Diabetes mellitus without complication (HCC)    Hyperlipidemia    Hypertension    Seasonal allergies     Family History  Problem Relation Age of Onset   Hypertension Mother    Hypertension Father    Colon cancer Neg Hx    Colon polyps Neg Hx    Esophageal cancer Neg Hx    Rectal cancer Neg Hx    Stomach cancer Neg Hx     Past Surgical History:  Procedure Laterality Date   NO PAST SURGERIES     Social  History   Occupational History   Not on file  Tobacco Use   Smoking status: Never   Smokeless tobacco: Never  Vaping Use   Vaping Use: Never used  Substance and Sexual Activity   Alcohol use: Not Currently    Alcohol/week: 1.0 standard drink of alcohol    Types: 1 Cans of beer per week    Comment: [place].   Drug use: Yes    Types: Marijuana    Comment: Daily   Sexual activity: Yes

## 2022-07-19 ENCOUNTER — Other Ambulatory Visit: Payer: Self-pay

## 2022-07-30 ENCOUNTER — Ambulatory Visit: Payer: 59 | Attending: Critical Care Medicine | Admitting: Critical Care Medicine

## 2022-07-30 ENCOUNTER — Encounter: Payer: Self-pay | Admitting: Critical Care Medicine

## 2022-07-30 ENCOUNTER — Other Ambulatory Visit: Payer: Self-pay

## 2022-07-30 VITALS — BP 116/74 | HR 54 | Wt 157.6 lb

## 2022-07-30 DIAGNOSIS — E1169 Type 2 diabetes mellitus with other specified complication: Secondary | ICD-10-CM | POA: Diagnosis not present

## 2022-07-30 DIAGNOSIS — B351 Tinea unguium: Secondary | ICD-10-CM | POA: Insufficient documentation

## 2022-07-30 DIAGNOSIS — I10 Essential (primary) hypertension: Secondary | ICD-10-CM

## 2022-07-30 DIAGNOSIS — M1A9XX0 Chronic gout, unspecified, without tophus (tophi): Secondary | ICD-10-CM | POA: Diagnosis not present

## 2022-07-30 DIAGNOSIS — E119 Type 2 diabetes mellitus without complications: Secondary | ICD-10-CM

## 2022-07-30 DIAGNOSIS — E785 Hyperlipidemia, unspecified: Secondary | ICD-10-CM

## 2022-07-30 DIAGNOSIS — N1831 Chronic kidney disease, stage 3a: Secondary | ICD-10-CM | POA: Diagnosis not present

## 2022-07-30 DIAGNOSIS — Z23 Encounter for immunization: Secondary | ICD-10-CM | POA: Diagnosis not present

## 2022-07-30 MED ORDER — CICLOPIROX 8 % EX SOLN
Freq: Every day | CUTANEOUS | 0 refills | Status: DC
  Filled 2022-07-30: qty 6.6, 7d supply, fill #0

## 2022-07-30 NOTE — Assessment & Plan Note (Signed)
-  Continue statins ?

## 2022-07-30 NOTE — Progress Notes (Signed)
Established Patient Office Visit  Subjective:  Patient ID: OTONIEL Warren, male    DOB: 1961/05/03  Age: 61 y.o. MRN: [id number]  CC:  Chief Complaint  Patient presents with   Medication Refill    HPI 09/2021 Timothy Warren presents for hypertension and type 2 diabetes.  Note on arrival blood pressure still quite elevated and he is on amlodipine 5 mg daily hydrochlorothiazide 25 mg daily.  Note he has been eating quite a bit of salt in the diet.  His A1c is 6.6 and he is compliant with his metformin.  He is also on the atorvastatin daily.  He brings in his ophthalmology exam result and he had 20/20 vision and no evidence of retinopathy seen.  Patient does have a colonoscopy that is going to be completed soon.  The patient did receive his flu vaccine   Patient is not been able to pay for his diabetic testing supplies his insurance plan will not cover it also he has had difficulty with co-pays on the Viagra.He is planning on getting a new insurance plan in the near and also will get vision coverage  4/24 Patient returns in short-term follow-up on arrival blood pressure elevated 157/80 blood sugar 171 A1c however is good at 6.3  Patient did have a colonoscopy in March of this year does not have to have repeat in 7 years I found 1 small polyp on the patient.  Patient states he has been under some degree of stress has not been eating very healthy at times.  He is taking Viagra for erectile dysfunction and this is helped him Patient has no other complaints  6/26 Patient seen in return follow-up overall doing well decreasing his content of sweets in terms of intake.  On arrival blood sugar is 108 blood pressure 120/75.  He has no other complaints he does need to have his renal panel rechecked.  He has been following closely with clinical pharmacy.  9/26 Patient seen in return follow-up and doing well he was seen in August for gout flare of the left foot and was given colchicine if needed he  is not taking it now.  Uric acid was elevated.  A1c was 6.2 during last visit.  Blood pressure on arrival 116/74.   Past Medical History:  Diagnosis Date   Allergy    Chronic kidney disease    stage 2 CKD   Diabetes mellitus without complication ([place])    Hyperlipidemia    Hypertension    Seasonal allergies     Past Surgical History:  Procedure Laterality Date   NO PAST SURGERIES      Family History  Problem Relation Age of Onset   Hypertension Mother    Hypertension Father    Colon cancer Neg Hx    Colon polyps Neg Hx    Esophageal cancer Neg Hx    Rectal cancer Neg Hx    Stomach cancer Neg Hx     Social History   Socioeconomic History   Marital status: Significant Other    Spouse name: Not on file   Number of children: Not on file   Years of education: Not on file   Highest education level: Not on file  Occupational History   Not on file  Tobacco Use   Smoking status: Never   Smokeless tobacco: Never  Vaping Use   Vaping Use: Never used  Substance and Sexual Activity   Alcohol use: Not Currently    Alcohol/week: 1.0 standard  drink of alcohol    Types: 1 Cans of beer per week    Comment: [place].   Drug use: Yes    Types: Marijuana    Comment: Daily   Sexual activity: Yes  Other Topics Concern   Not on file  Social History Narrative   Not on file   Social Determinants of Health   Financial Resource Strain: Not on file  Food Insecurity: Not on file  Transportation Needs: Not on file  Physical Activity: Not on file  Stress: Not on file  Social Connections: Not on file  Intimate Partner Violence: Not on file    Outpatient Medications Prior to Visit  Medication Sig Dispense Refill   amLODipine (NORVASC) 10 MG tablet Take 1 tablet (10 mg total) by mouth daily. To lower blood pressure 90 tablet 2   atorvastatin (LIPITOR) 20 MG tablet Take 1 tablet (20 mg total) by mouth daily. 90 tablet 3   Blood Glucose Monitoring Suppl (ONETOUCH VERIO)  w/Device KIT Use to check blood sugar twice a day 1 kit 0   colchicine 0.6 MG tablet Take 2 at onset of gout.  Then take 1 tablet twice daily for 2 days prn gout flare 60 tablet 0   Dulaglutide (TRULICITY) 9.37 JI/9.6VE SOPN Inject 0.75 mg into the skin once a week. 2 mL 4   glucose blood (ONETOUCH VERIO) test strip Use to check blood sugar twice daily. 100 each 2   hydrochlorothiazide (HYDRODIURIL) 25 MG tablet Take 1 tablet (25 mg total) by mouth daily. 90 tablet 3   metFORMIN (GLUCOPHAGE) 500 MG tablet Take 1 tablet (500 mg total) by mouth 2 (two) times daily with a meal. 180 tablet 3   OneTouch Delica Lancets 93Y MISC Check blood sugar twice a day 100 each 2   sildenafil (VIAGRA) 50 MG tablet TAKE 1 TABLET (50 MG TOTAL) BY MOUTH DAILY AS NEEDED FOR ERECTILE DYSFUNCTION. 10 tablet 2   No facility-administered medications prior to visit.    No Known Allergies  ROS Review of Systems  Constitutional:  Negative for chills, diaphoresis and fever.  HENT:  Negative for congestion, hearing loss, nosebleeds, sore throat and tinnitus.   Eyes:  Negative for photophobia and redness.  Respiratory:  Negative for cough, shortness of breath, wheezing and stridor.   Cardiovascular:  Negative for chest pain, palpitations and leg swelling.  Gastrointestinal:  Negative for abdominal pain, blood in stool, constipation, diarrhea, nausea and vomiting.  Endocrine: Negative for polydipsia.  Genitourinary:  Negative for dysuria, flank pain, frequency, hematuria and urgency.  Musculoskeletal:  Negative for back pain, myalgias and neck pain.  Skin:  Negative for rash.  Allergic/Immunologic: Negative for environmental allergies.  Neurological:  Negative for dizziness, tremors, seizures, weakness and headaches.  Hematological:  Does not bruise/bleed easily.  Psychiatric/Behavioral:  Negative for suicidal ideas. The patient is not nervous/anxious.       Objective:    Physical Exam Vitals reviewed.   Constitutional:      Appearance: Normal appearance. He is well-developed. He is not diaphoretic.  HENT:     Head: Normocephalic and atraumatic.     Nose: Nose normal. No nasal deformity, septal deviation, mucosal edema or rhinorrhea.     Right Sinus: No maxillary sinus tenderness or frontal sinus tenderness.     Left Sinus: No maxillary sinus tenderness or frontal sinus tenderness.     Mouth/Throat:     Mouth: Mucous membranes are moist.     Pharynx: Oropharynx is  clear. No oropharyngeal exudate.  Eyes:     General: No scleral icterus.    Conjunctiva/sclera: Conjunctivae normal.     Pupils: Pupils are equal, round, and reactive to light.  Neck:     Thyroid: No thyromegaly.     Vascular: No carotid bruit or JVD.     Trachea: Trachea normal. No tracheal tenderness or tracheal deviation.  Cardiovascular:     Rate and Rhythm: Normal rate and regular rhythm.     Chest Wall: PMI is not displaced.     Pulses: Normal pulses. No decreased pulses.     Heart sounds: Normal heart sounds, S1 normal and S2 normal. Heart sounds not distant. No murmur heard.    No systolic murmur is present.     No diastolic murmur is present.     No friction rub. No gallop. No S3 or S4 sounds.  Pulmonary:     Effort: Pulmonary effort is normal. No tachypnea, accessory muscle usage or respiratory distress.     Breath sounds: Normal breath sounds. No stridor. No decreased breath sounds, wheezing, rhonchi or rales.  Chest:     Chest wall: No tenderness.  Abdominal:     General: Bowel sounds are normal. There is no distension.     Palpations: Abdomen is soft. Abdomen is not rigid.     Tenderness: There is no abdominal tenderness. There is no guarding or rebound.  Musculoskeletal:        General: Normal range of motion.     Cervical back: Normal range of motion and neck supple. No edema, erythema or rigidity. No muscular tenderness. Normal range of motion.     Comments: Foot exam shows minimal toenail fungus  right fifth toenail otherwise normal  Lymphadenopathy:     Head:     Right side of head: No submental or submandibular adenopathy.     Left side of head: No submental or submandibular adenopathy.     Cervical: No cervical adenopathy.  Skin:    General: Skin is warm and dry.     Coloration: Skin is not pale.     Findings: No rash.     Nails: There is no clubbing.  Neurological:     Mental Status: He is alert and oriented to person, place, and time.     Sensory: No sensory deficit.  Psychiatric:        Mood and Affect: Mood normal.        Speech: Speech normal.        Behavior: Behavior normal.        Thought Content: Thought content normal.        Judgment: Judgment normal.     BP 116/74   Pulse (!) 54   Wt 157 lb 9.6 oz (71.5 kg)   SpO2 98%   BMI 23.27 kg/m  Wt Readings from Last 3 Encounters:  07/30/22 157 lb 9.6 oz (71.5 kg)  07/09/22 154 lb (69.9 kg)  06/13/22 157 lb (71.2 kg)     Health Maintenance Due  Topic Date Due   COVID-19 Vaccine (3 - Moderna series) 03/14/2021   Zoster Vaccines- Shingrix (2 of 2) 04/23/2022    There are no preventive care reminders to display for this patient.  No results found for: "TSH" Lab Results  Component Value Date   WBC 8.6 06/13/2022   HGB 12.7 (L) 06/13/2022   HCT 38.5 06/13/2022   MCV 83 06/13/2022   PLT 268 06/13/2022   Lab Results  Component  Value Date   NA 133 (L) 04/29/2022   K 3.3 (L) 04/29/2022   CO2 23 04/29/2022   GLUCOSE 87 04/29/2022   BUN 25 (H) 04/29/2022   CREATININE 2.04 (H) 04/29/2022   BILITOT 0.3 03/26/2022   ALKPHOS 60 03/26/2022   AST 27 03/26/2022   ALT 20 03/26/2022   PROT 6.9 03/26/2022   ALBUMIN 4.5 04/29/2022   CALCIUM 9.6 04/29/2022   ANIONGAP 9 01/17/2020   EGFR 37 (L) 04/29/2022   Lab Results  Component Value Date   CHOL 145 02/25/2022   Lab Results  Component Value Date   HDL 57 02/25/2022   Lab Results  Component Value Date   LDLCALC 44 02/25/2022   Lab Results   Component Value Date   TRIG 293 (H) 02/25/2022   Lab Results  Component Value Date   CHOLHDL 2.5 02/25/2022   Lab Results  Component Value Date   HGBA1C 6.2 06/11/2022      Assessment & Plan:   Problem List Items Addressed This Visit       Cardiovascular and Mediastinum   Hypertension    Blood pressure at goal no changes        Endocrine   Type 2 diabetes mellitus without complication, without long-term current use of insulin (HCC) - Primary    A1c well controlled continue current medication      Hyperlipidemia due to type 2 diabetes mellitus (HCC)    Continue statins        Musculoskeletal and Integument   Onychomycosis    Onychomycosis right fifth toenail  Apply topical Penlac      Relevant Medications   ciclopirox (PENLAC) 8 % solution     Genitourinary   Stage 3a chronic kidney disease ([place])    Patient seen by nephrology they feel his renal function is improving we will monitor avoid NSAIDs        Other   Chronic gout without tophus    No active flare at this time we will monitor      Other Visit Diagnoses     Need for immunization against influenza       Relevant Orders   Flu Vaccine QUAD 8moIM (Fluarix, Fluzone & Alfiuria Quad PF) (Completed)      Meds ordered this encounter  Medications   ciclopirox (PENLAC) 8 % solution    Sig: Apply topically at bedtime. Apply over nail and surrounding skin. Apply daily over previous coat. After seven (7) days, may remove with alcohol and continue cycle.  To affected nail on foot    Dispense:  6.6 mL    Refill:  0    May substitute  Follow-up: Return in about 5 months (around 12/30/2022) for htn, diabetes.    PAsencion Noble MD

## 2022-07-30 NOTE — Assessment & Plan Note (Signed)
No active flare at this time we will monitor

## 2022-07-30 NOTE — Assessment & Plan Note (Signed)
Blood pressure at goal no changes ?

## 2022-07-30 NOTE — Patient Instructions (Signed)
Keep up the great work on your health status  No change in medications refills are available  I gave you a topical cream ointment to put on your toenail on the right foot to remove the fungus out of the toenail  Return to see Dr. Joya Gaskins 5 months

## 2022-07-30 NOTE — Assessment & Plan Note (Signed)
Patient seen by nephrology they feel his renal function is improving we will monitor avoid NSAIDs

## 2022-07-30 NOTE — Assessment & Plan Note (Signed)
A1c well controlled continue current medication

## 2022-07-30 NOTE — Assessment & Plan Note (Signed)
Onychomycosis right fifth toenail  Apply topical Penlac

## 2022-08-06 ENCOUNTER — Other Ambulatory Visit: Payer: 59

## 2022-08-09 ENCOUNTER — Other Ambulatory Visit: Payer: Self-pay

## 2022-08-15 ENCOUNTER — Ambulatory Visit: Payer: 59 | Admitting: Orthopedic Surgery

## 2022-08-20 ENCOUNTER — Other Ambulatory Visit: Payer: Self-pay

## 2022-08-29 ENCOUNTER — Other Ambulatory Visit: Payer: Self-pay

## 2022-09-09 ENCOUNTER — Ambulatory Visit
Admission: RE | Admit: 2022-09-09 | Discharge: 2022-09-09 | Disposition: A | Discharge status: Home / Self Care | Payer: 59 | Source: Ambulatory Visit | Attending: Surgical | Admitting: Surgical

## 2022-09-09 DIAGNOSIS — G8929 Other chronic pain: Secondary | ICD-10-CM | POA: Diagnosis not present

## 2022-09-09 DIAGNOSIS — M19012 Primary osteoarthritis, left shoulder: Secondary | ICD-10-CM | POA: Diagnosis not present

## 2022-09-11 ENCOUNTER — Encounter: Payer: Self-pay | Admitting: Orthopedic Surgery

## 2022-09-11 ENCOUNTER — Ambulatory Visit: Payer: 59 | Admitting: Orthopedic Surgery

## 2022-09-11 DIAGNOSIS — M19012 Primary osteoarthritis, left shoulder: Secondary | ICD-10-CM | POA: Diagnosis not present

## 2022-09-11 NOTE — Progress Notes (Signed)
Office Visit Note   Patient: Timothy Warren           Date of Birth: Oct 18, 1961           MRN: [id number] Visit Date: 09/11/2022 Requested by: Elsie Stain, MD [address],  [place] [address] PCP: Elsie Stain, MD  Subjective: Chief Complaint  Patient presents with   Other     Scan review    HPI: Timothy Warren is a 61 y.o. male who presents to the office reporting left shoulder pain.  Since he was last seen has had a CT scan.  At this time moderate arthritis with some posterior glenoid erosion.  Slight narrowing of the acromiohumeral interval is also noted.  Had an injection which did not help much.  Takes Tylenol for pain.  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                ROS: All systems reviewed are negative as they relate to the chief complaint within the history of present illness.  Patient denies fevers or chills.  Assessment & Plan: Visit Diagnoses: No diagnosis found.  Plan: Impression is left shoulder arthritis which is not really bad enough yet for replacement.  He is getting some restricted range of motion.  I think he is heading for shoulder replacement at sometime in the future but for now with work and job modifications he may be able to delay that for a year or 2.  Note provided.  Follow-up with [place] as needed.  Follow-Up Instructions: No follow-ups on file.   Orders:  No orders of the defined types were placed in this encounter.  No orders of the defined types were placed in this encounter.     Procedures: No procedures performed   Clinical Data: No additional findings.  Objective: Vital Signs: There were no vitals taken for this visit.  Physical Exam:  Constitutional: Patient appears well-developed HEENT:  Head: Normocephalic Eyes:EOM are normal Neck: Normal range of motion Cardiovascular: Normal rate Pulmonary/chest: Effort normal Neurologic: Patient is alert Skin: Skin is warm Psychiatric:  Patient has normal mood and affect  Ortho Exam: Ortho exam demonstrates passive range of motion on the left of 35/70/150.  Good rotator cuff strength infraspinatus extremity subscap muscle testing.  Does have little bit of coarseness with internal/external rotation of that left arm but it is more bone-on-bone coarseness as opposed to soft tissue coarseness.  Motor or sensory function of the hand is intact.  Neck range of motion full.  Specialty Comments:  No specialty comments available.  Imaging: No results found.   PMFS History: Patient Active Problem List   Diagnosis Date Noted   Onychomycosis 07/30/2022   Chronic gout without tophus 07/30/2022   Primary osteoarthritis, left shoulder 11/13/2021   Chronic right shoulder pain 10/16/2021   Hyperlipidemia due to type 2 diabetes mellitus ([place]) 05/28/2021   Type 2 diabetes mellitus without complication, without long-term current use of insulin ([place]) 08/12/2020   Stage 3a chronic kidney disease ([place]) 08/12/2020   Erectile dysfunction associated with type 2 diabetes mellitus ([place]) 08/12/2020   Hypertension 02/17/2020   Past Medical History:  Diagnosis Date   Allergy    Chronic kidney disease    stage 2 CKD   Diabetes mellitus without complication (HCC)    Hyperlipidemia    Hypertension    Seasonal allergies     Family History  Problem Relation  Age of Onset   Hypertension Mother    Hypertension Father    Colon cancer Neg Hx    Colon polyps Neg Hx    Esophageal cancer Neg Hx    Rectal cancer Neg Hx    Stomach cancer Neg Hx     Past Surgical History:  Procedure Laterality Date   NO PAST SURGERIES     Social History   Occupational History   Not on file  Tobacco Use   Smoking status: Never   Smokeless tobacco: Never  Vaping Use   Vaping Use: Never used  Substance and Sexual Activity   Alcohol use: Not Currently    Alcohol/week: 1.0 standard drink of alcohol    Types: 1 Cans of beer per week    Comment: [place].   Drug use: Yes    Types: Marijuana    Comment: Daily   Sexual activity: Yes

## 2022-09-16 ENCOUNTER — Other Ambulatory Visit: Payer: Self-pay

## 2022-09-23 ENCOUNTER — Telehealth: Payer: Self-pay

## 2022-09-23 NOTE — Telephone Encounter (Signed)
Called patient letting him know we have his FMLA, He stated that paperwork is need to be signed by orthopedics and not [place].He is on short term disability for his shoulder.   I faxed paperwork over to [facility] to Dr.Dean

## 2022-09-24 ENCOUNTER — Telehealth: Payer: Self-pay | Admitting: Orthopedic Surgery

## 2022-09-24 NOTE — Telephone Encounter (Signed)
[employer] forms received. Please advise patients work status. Thank you!

## 2022-09-25 NOTE — Telephone Encounter (Signed)
3 I talked with him.  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  His job actually put him on short-term disability until December 8 which I am finally asked based on his shoulder arthritis.  He is okay to go back to work after being.  I will no restrictions.  I think they took him out primarily because of the lining of your not being able to accommodate his restrictions.  After talking with him on the phone he wants to try to go back to work.

## 2022-09-30 NOTE — Telephone Encounter (Signed)
Holding for Lauren. ?

## 2022-10-01 NOTE — Telephone Encounter (Signed)
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  No restrictions.  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  He is okay from my perspective to go back to work December 9 with no restrictions.  Thanks

## 2022-10-01 NOTE — Telephone Encounter (Signed)
Noted for [place] ?

## 2022-10-09 ENCOUNTER — Other Ambulatory Visit: Payer: Self-pay | Admitting: Critical Care Medicine

## 2022-10-21 ENCOUNTER — Other Ambulatory Visit: Payer: Self-pay

## 2022-10-31 ENCOUNTER — Other Ambulatory Visit: Payer: Self-pay

## 2022-11-21 DIAGNOSIS — N1832 Chronic kidney disease, stage 3b: Secondary | ICD-10-CM | POA: Diagnosis not present

## 2022-11-22 ENCOUNTER — Other Ambulatory Visit: Payer: Self-pay

## 2022-11-25 ENCOUNTER — Other Ambulatory Visit: Payer: Self-pay

## 2022-12-07 NOTE — Progress Notes (Unsigned)
Established Patient Office Visit  Subjective:  Patient ID: Timothy Warren, male    DOB: [birth year]  Age: 62 y.o. MRN: [id number]  CC:  No chief complaint on file.   HPI 09/2021 Timothy Warren presents for hypertension and type 2 diabetes.  Note on arrival blood pressure still quite elevated and he is on amlodipine 5 mg daily hydrochlorothiazide 25 mg daily.  Note he has been eating quite a bit of salt in the diet.  His A1c is 6.6 and he is compliant with his metformin.  He is also on the atorvastatin daily.  He brings in his ophthalmology exam result and he had 20/20 vision and no evidence of retinopathy seen.  Patient does have a colonoscopy that is going to be completed soon.  The patient did receive his flu vaccine   Patient is not been able to pay for his diabetic testing supplies his insurance plan will not cover it also he has had difficulty with co-pays on the Viagra.He is planning on getting a new insurance plan in the near and also will get vision coverage  4/24 Patient returns in short-term follow-up on arrival blood pressure elevated 157/80 blood sugar 171 A1c however is good at 6.3  Patient did have a colonoscopy in March of this year does not have to have repeat in 7 years I found 1 small polyp on the patient.  Patient states he has been under some degree of stress has not been eating very healthy at times.  He is taking Viagra for erectile dysfunction and this is helped him Patient has no other complaints  6/26 Patient seen in return follow-up overall doing well decreasing his content of sweets in terms of intake.  On arrival blood sugar is 108 blood pressure 120/75.  He has no other complaints he does need to have his renal panel rechecked.  He has been following closely with clinical pharmacy.  9/26 Patient seen in return follow-up and doing well he was seen in August for gout flare of the left foot and was given colchicine if needed he is not taking it now.  Uric acid  was elevated.  A1c was 6.2 during last visit.  Blood pressure on arrival 116/74.   12/10/22  Hypertension    Blood pressure at goal no changes        Endocrine   Type 2 diabetes mellitus without complication, without long-term current use of insulin (HCC) - Primary    A1c well controlled continue current medication      Hyperlipidemia due to type 2 diabetes mellitus (HCC)    Continue statins        Musculoskeletal and Integument   Onychomycosis    Onychomycosis right fifth toenail  Apply topical Penlac      Relevant Medications   ciclopirox (PENLAC) 8 % solution     Genitourinary   Stage 3a chronic kidney disease ([place])    Patient seen by nephrology they feel his renal function is improving we will monitor avoid NSAIDs        Other   Chronic gout without tophus    No active flare at this time we will monitor      Other Visit Diagnoses     Need for immunization against influenza       Relevant Orders   Flu Vaccine QUAD 42moIM (Fluarix, Fluzone & Alfiuria Quad PF) (Completed)      Past Medical History:  Diagnosis Date   Allergy  Chronic kidney disease    stage 2 CKD   Diabetes mellitus without complication (HCC)    Hyperlipidemia    Hypertension    Seasonal allergies     Past Surgical History:  Procedure Laterality Date   NO PAST SURGERIES      Family History  Problem Relation Age of Onset   Hypertension Mother    Hypertension Father    Colon cancer Neg Hx    Colon polyps Neg Hx    Esophageal cancer Neg Hx    Rectal cancer Neg Hx    Stomach cancer Neg Hx     Social History   Socioeconomic History   Marital status: Significant Other    Spouse name: Not on file   Number of children: Not on file   Years of education: Not on file   Highest education level: Not on file  Occupational History   Not on file  Tobacco Use   Smoking status: Never   Smokeless tobacco: Never  Vaping Use   Vaping Use: Never used  Substance and Sexual Activity    Alcohol use: Not Currently    Alcohol/week: 1.0 standard drink of alcohol    Types: 1 Cans of beer per week    Comment: [place].   Drug use: Yes    Types: Marijuana    Comment: Daily   Sexual activity: Yes  Other Topics Concern   Not on file  Social History Narrative   Not on file   Social Determinants of Health   Financial Resource Strain: Not on file  Food Insecurity: Not on file  Transportation Needs: Not on file  Physical Activity: Not on file  Stress: Not on file  Social Connections: Not on file  Intimate Partner Violence: Not on file    Outpatient Medications Prior to Visit  Medication Sig Dispense Refill   amLODipine (NORVASC) 10 MG tablet Take 1 tablet (10 mg total) by mouth daily. To lower blood pressure 90 tablet 2   atorvastatin (LIPITOR) 20 MG tablet Take 1 tablet (20 mg total) by mouth daily. 90 tablet 3   Blood Glucose Monitoring Suppl (ONETOUCH VERIO) w/Device KIT Use to check blood sugar twice a day 1 kit 0   ciclopirox (PENLAC) 8 % solution Apply topically at bedtime. Apply over nail and surrounding skin. Apply daily over previous coat. After seven (7) days, may remove with alcohol and continue cycle.  To affected nail on foot 6.6 mL 0   colchicine 0.6 MG tablet Take 2 at onset of gout.  Then take 1 tablet twice daily for 2 days prn gout flare 60 tablet 0   Dulaglutide (TRULICITY) 1.49 FW/2.6VZ SOPN Inject 0.75 mg into the skin once a week. 2 mL 4   glucose blood (ONETOUCH VERIO) test strip Use to check blood sugar twice daily. 100 each 2   hydrochlorothiazide (HYDRODIURIL) 25 MG tablet Take 1 tablet (25 mg total) by mouth daily. 90 tablet 3   metFORMIN (GLUCOPHAGE) 500 MG tablet Take 1 tablet (500 mg total) by mouth 2 (two) times daily with a meal. 180 tablet 3   OneTouch Delica Lancets 85Y MISC Check blood sugar twice a day 100 each 2   sildenafil (VIAGRA) 50 MG tablet TAKE 1 TABLET (50 MG TOTAL) BY MOUTH DAILY AS NEEDED FOR ERECTILE DYSFUNCTION.  10 tablet 2   No facility-administered medications prior to visit.    No Known Allergies  ROS Review of Systems  Constitutional:  Negative for chills, diaphoresis  and fever.  HENT:  Negative for congestion, hearing loss, nosebleeds, sore throat and tinnitus.   Eyes:  Negative for photophobia and redness.  Respiratory:  Negative for cough, shortness of breath, wheezing and stridor.   Cardiovascular:  Negative for chest pain, palpitations and leg swelling.  Gastrointestinal:  Negative for abdominal pain, blood in stool, constipation, diarrhea, nausea and vomiting.  Endocrine: Negative for polydipsia.  Genitourinary:  Negative for dysuria, flank pain, frequency, hematuria and urgency.  Musculoskeletal:  Negative for back pain, myalgias and neck pain.  Skin:  Negative for rash.  Allergic/Immunologic: Negative for environmental allergies.  Neurological:  Negative for dizziness, tremors, seizures, weakness and headaches.  Hematological:  Does not bruise/bleed easily.  Psychiatric/Behavioral:  Negative for suicidal ideas. The patient is not nervous/anxious.       Objective:    Physical Exam Vitals reviewed.  Constitutional:      Appearance: Normal appearance. He is well-developed. He is not diaphoretic.  HENT:     Head: Normocephalic and atraumatic.     Nose: Nose normal. No nasal deformity, septal deviation, mucosal edema or rhinorrhea.     Right Sinus: No maxillary sinus tenderness or frontal sinus tenderness.     Left Sinus: No maxillary sinus tenderness or frontal sinus tenderness.     Mouth/Throat:     Mouth: Mucous membranes are moist.     Pharynx: Oropharynx is clear. No oropharyngeal exudate.  Eyes:     General: No scleral icterus.    Conjunctiva/sclera: Conjunctivae normal.     Pupils: Pupils are equal, round, and reactive to light.  Neck:     Thyroid: No thyromegaly.     Vascular: No carotid bruit or JVD.     Trachea: Trachea normal. No tracheal tenderness or  tracheal deviation.  Cardiovascular:     Rate and Rhythm: Normal rate and regular rhythm.     Chest Wall: PMI is not displaced.     Pulses: Normal pulses. No decreased pulses.     Heart sounds: Normal heart sounds, S1 normal and S2 normal. Heart sounds not distant. No murmur heard.    No systolic murmur is present.     No diastolic murmur is present.     No friction rub. No gallop. No S3 or S4 sounds.  Pulmonary:     Effort: Pulmonary effort is normal. No tachypnea, accessory muscle usage or respiratory distress.     Breath sounds: Normal breath sounds. No stridor. No decreased breath sounds, wheezing, rhonchi or rales.  Chest:     Chest wall: No tenderness.  Abdominal:     General: Bowel sounds are normal. There is no distension.     Palpations: Abdomen is soft. Abdomen is not rigid.     Tenderness: There is no abdominal tenderness. There is no guarding or rebound.  Musculoskeletal:        General: Normal range of motion.     Cervical back: Normal range of motion and neck supple. No edema, erythema or rigidity. No muscular tenderness. Normal range of motion.     Comments: Foot exam shows minimal toenail fungus right fifth toenail otherwise normal  Lymphadenopathy:     Head:     Right side of head: No submental or submandibular adenopathy.     Left side of head: No submental or submandibular adenopathy.     Cervical: No cervical adenopathy.  Skin:    General: Skin is warm and dry.     Coloration: Skin is not pale.  Findings: No rash.     Nails: There is no clubbing.  Neurological:     Mental Status: He is alert and oriented to person, place, and time.     Sensory: No sensory deficit.  Psychiatric:        Mood and Affect: Mood normal.        Speech: Speech normal.        Behavior: Behavior normal.        Thought Content: Thought content normal.        Judgment: Judgment normal.     There were no vitals taken for this visit. Wt Readings from Last 3 Encounters:  07/30/22  157 lb 9.6 oz (71.5 kg)  07/09/22 154 lb (69.9 kg)  06/13/22 157 lb (71.2 kg)     Health Maintenance Due  Topic Date Due   Zoster Vaccines- Shingrix (2 of 2) 04/23/2022   COVID-19 Vaccine (3 - 2023-24 season) 07/05/2022   OPHTHALMOLOGY EXAM  08/16/2022    There are no preventive care reminders to display for this patient.  No results found for: "TSH" Lab Results  Component Value Date   WBC 8.6 06/13/2022   HGB 12.7 (L) 06/13/2022   HCT 38.5 06/13/2022   MCV 83 06/13/2022   PLT 268 06/13/2022   Lab Results  Component Value Date   NA 133 (L) 04/29/2022   K 3.3 (L) 04/29/2022   CO2 23 04/29/2022   GLUCOSE 87 04/29/2022   BUN 25 (H) 04/29/2022   CREATININE 2.04 (H) 04/29/2022   BILITOT 0.3 03/26/2022   ALKPHOS 60 03/26/2022   AST 27 03/26/2022   ALT 20 03/26/2022   PROT 6.9 03/26/2022   ALBUMIN 4.5 04/29/2022   CALCIUM 9.6 04/29/2022   ANIONGAP 9 01/17/2020   EGFR 37 (L) 04/29/2022   Lab Results  Component Value Date   CHOL 145 02/25/2022   Lab Results  Component Value Date   HDL 57 02/25/2022   Lab Results  Component Value Date   LDLCALC 44 02/25/2022   Lab Results  Component Value Date   TRIG 293 (H) 02/25/2022   Lab Results  Component Value Date   CHOLHDL 2.5 02/25/2022   Lab Results  Component Value Date   HGBA1C 6.2 06/11/2022      Assessment & Plan:   Problem List Items Addressed This Visit   None No orders of the defined types were placed in this encounter. Follow-up: No follow-ups on file.    Asencion Noble, MD

## 2022-12-10 ENCOUNTER — Ambulatory Visit: Payer: 59 | Attending: Critical Care Medicine | Admitting: Critical Care Medicine

## 2022-12-10 ENCOUNTER — Encounter: Payer: Self-pay | Admitting: Critical Care Medicine

## 2022-12-10 VITALS — BP 130/68 | HR 62 | Wt 158.6 lb

## 2022-12-10 DIAGNOSIS — E1169 Type 2 diabetes mellitus with other specified complication: Secondary | ICD-10-CM

## 2022-12-10 DIAGNOSIS — B351 Tinea unguium: Secondary | ICD-10-CM

## 2022-12-10 DIAGNOSIS — E119 Type 2 diabetes mellitus without complications: Secondary | ICD-10-CM

## 2022-12-10 DIAGNOSIS — N189 Chronic kidney disease, unspecified: Secondary | ICD-10-CM

## 2022-12-10 DIAGNOSIS — I10 Essential (primary) hypertension: Secondary | ICD-10-CM | POA: Diagnosis not present

## 2022-12-10 DIAGNOSIS — N1831 Chronic kidney disease, stage 3a: Secondary | ICD-10-CM

## 2022-12-10 DIAGNOSIS — E785 Hyperlipidemia, unspecified: Secondary | ICD-10-CM

## 2022-12-10 LAB — GLUCOSE, POCT (MANUAL RESULT ENTRY): POC Glucose: 98 mg/dl (ref 70–99)

## 2022-12-10 LAB — POCT GLYCOSYLATED HEMOGLOBIN (HGB A1C): HbA1c, POC (controlled diabetic range): 6.3 % — AB (ref 0.0–7.0)

## 2022-12-10 MED ORDER — AMLODIPINE BESYLATE 10 MG PO TABS
10.0000 mg | ORAL_TABLET | Freq: Every day | ORAL | 2 refills | Status: DC
  Filled 2022-12-30: qty 30, 30d supply, fill #0
  Filled 2023-01-30: qty 30, 30d supply, fill #1
  Filled 2023-03-03: qty 30, 30d supply, fill #2
  Filled 2023-04-04: qty 30, 30d supply, fill #3
  Filled 2023-05-06: qty 30, 30d supply, fill #4
  Filled 2023-06-10: qty 30, 30d supply, fill #5

## 2022-12-10 MED ORDER — HYDROCHLOROTHIAZIDE 25 MG PO TABS
25.0000 mg | ORAL_TABLET | Freq: Every day | ORAL | 3 refills | Status: DC
  Filled 2022-12-30: qty 30, 30d supply, fill #0
  Filled 2023-01-30: qty 30, 30d supply, fill #1
  Filled 2023-03-03: qty 30, 30d supply, fill #2
  Filled 2023-04-04: qty 30, 30d supply, fill #3
  Filled 2023-05-06: qty 30, 30d supply, fill #4
  Filled 2023-06-10: qty 30, 30d supply, fill #5

## 2022-12-10 MED ORDER — METFORMIN HCL 500 MG PO TABS
500.0000 mg | ORAL_TABLET | Freq: Two times a day (BID) | ORAL | 3 refills | Status: DC
  Filled 2022-12-30: qty 60, 30d supply, fill #0
  Filled 2023-01-30: qty 60, 30d supply, fill #1
  Filled 2023-03-03: qty 60, 30d supply, fill #2
  Filled 2023-05-06: qty 60, 30d supply, fill #3
  Filled 2023-06-10: qty 60, 30d supply, fill #4

## 2022-12-10 MED ORDER — ATORVASTATIN CALCIUM 20 MG PO TABS
20.0000 mg | ORAL_TABLET | Freq: Every day | ORAL | 3 refills | Status: DC
  Filled 2022-12-30: qty 30, 30d supply, fill #0
  Filled 2023-01-30: qty 30, 30d supply, fill #1
  Filled 2023-03-03: qty 30, 30d supply, fill #2
  Filled 2023-04-04: qty 30, 30d supply, fill #3
  Filled 2023-05-06: qty 30, 30d supply, fill #4
  Filled 2023-06-10: qty 30, 30d supply, fill #5

## 2022-12-10 NOTE — Assessment & Plan Note (Signed)
Blood pressure well-controlled on amlodipine and hydrochlorothiazide no changes will be made will check renal panel

## 2022-12-10 NOTE — Assessment & Plan Note (Signed)
Reassess renal panel

## 2022-12-10 NOTE — Assessment & Plan Note (Signed)
Continue with statin therapy 

## 2022-12-10 NOTE — Patient Instructions (Signed)
Stop trulicity No other medication changes Lab : kidney panel  Return Dr Joya Gaskins 6 months  Excellent work on chronic conditions

## 2022-12-10 NOTE — Assessment & Plan Note (Signed)
No changes

## 2022-12-11 ENCOUNTER — Telehealth: Payer: Self-pay

## 2022-12-11 DIAGNOSIS — E1122 Type 2 diabetes mellitus with diabetic chronic kidney disease: Secondary | ICD-10-CM | POA: Diagnosis not present

## 2022-12-11 DIAGNOSIS — E785 Hyperlipidemia, unspecified: Secondary | ICD-10-CM | POA: Diagnosis not present

## 2022-12-11 DIAGNOSIS — I129 Hypertensive chronic kidney disease with stage 1 through stage 4 chronic kidney disease, or unspecified chronic kidney disease: Secondary | ICD-10-CM | POA: Diagnosis not present

## 2022-12-11 DIAGNOSIS — N182 Chronic kidney disease, stage 2 (mild): Secondary | ICD-10-CM | POA: Diagnosis not present

## 2022-12-11 LAB — BMP8+EGFR
BUN/Creatinine Ratio: 17 (ref 10–24)
BUN: 30 mg/dL — ABNORMAL HIGH (ref 8–27)
CO2: 25 mmol/L (ref 20–29)
Calcium: 9.9 mg/dL (ref 8.6–10.2)
Chloride: 98 mmol/L (ref 96–106)
Creatinine, Ser: 1.74 mg/dL — ABNORMAL HIGH (ref 0.76–1.27)
Glucose: 99 mg/dL (ref 70–99)
Potassium: 3.8 mmol/L (ref 3.5–5.2)
Sodium: 140 mmol/L (ref 134–144)
eGFR: 44 mL/min/{1.73_m2} — ABNORMAL LOW (ref >59)

## 2022-12-11 NOTE — Progress Notes (Signed)
Let pt know kidney function is improved  pt is doing great and keep up good work!

## 2022-12-11 NOTE — Telephone Encounter (Signed)
-----   Message from Elsie Stain, MD sent at 12/11/2022  6:07 AM EST ----- Let pt know kidney function is improved  pt is doing great and keep up good work!

## 2022-12-11 NOTE — Telephone Encounter (Signed)
Pt was called and vm was left, Information has been sent to nurse pool.   

## 2022-12-14 DIAGNOSIS — Z008 Encounter for other general examination: Secondary | ICD-10-CM | POA: Diagnosis not present

## 2022-12-24 ENCOUNTER — Ambulatory Visit: Payer: 59 | Admitting: Critical Care Medicine

## 2022-12-27 ENCOUNTER — Other Ambulatory Visit: Payer: Self-pay

## 2022-12-30 ENCOUNTER — Other Ambulatory Visit: Payer: Self-pay

## 2022-12-31 ENCOUNTER — Encounter: Payer: Self-pay | Admitting: Critical Care Medicine

## 2023-01-30 ENCOUNTER — Other Ambulatory Visit: Payer: Self-pay

## 2023-02-14 IMAGING — CR DG SHOULDER 2+V*L*
3 series · 3 of 3 positions shown · non-contrast
Comparison: None.

CLINICAL DATA: Chronic left shoulder pain.  Denies injury.

EXAM:
LEFT SHOULDER - 2+ VIEW

[w shoulder ap internal left]
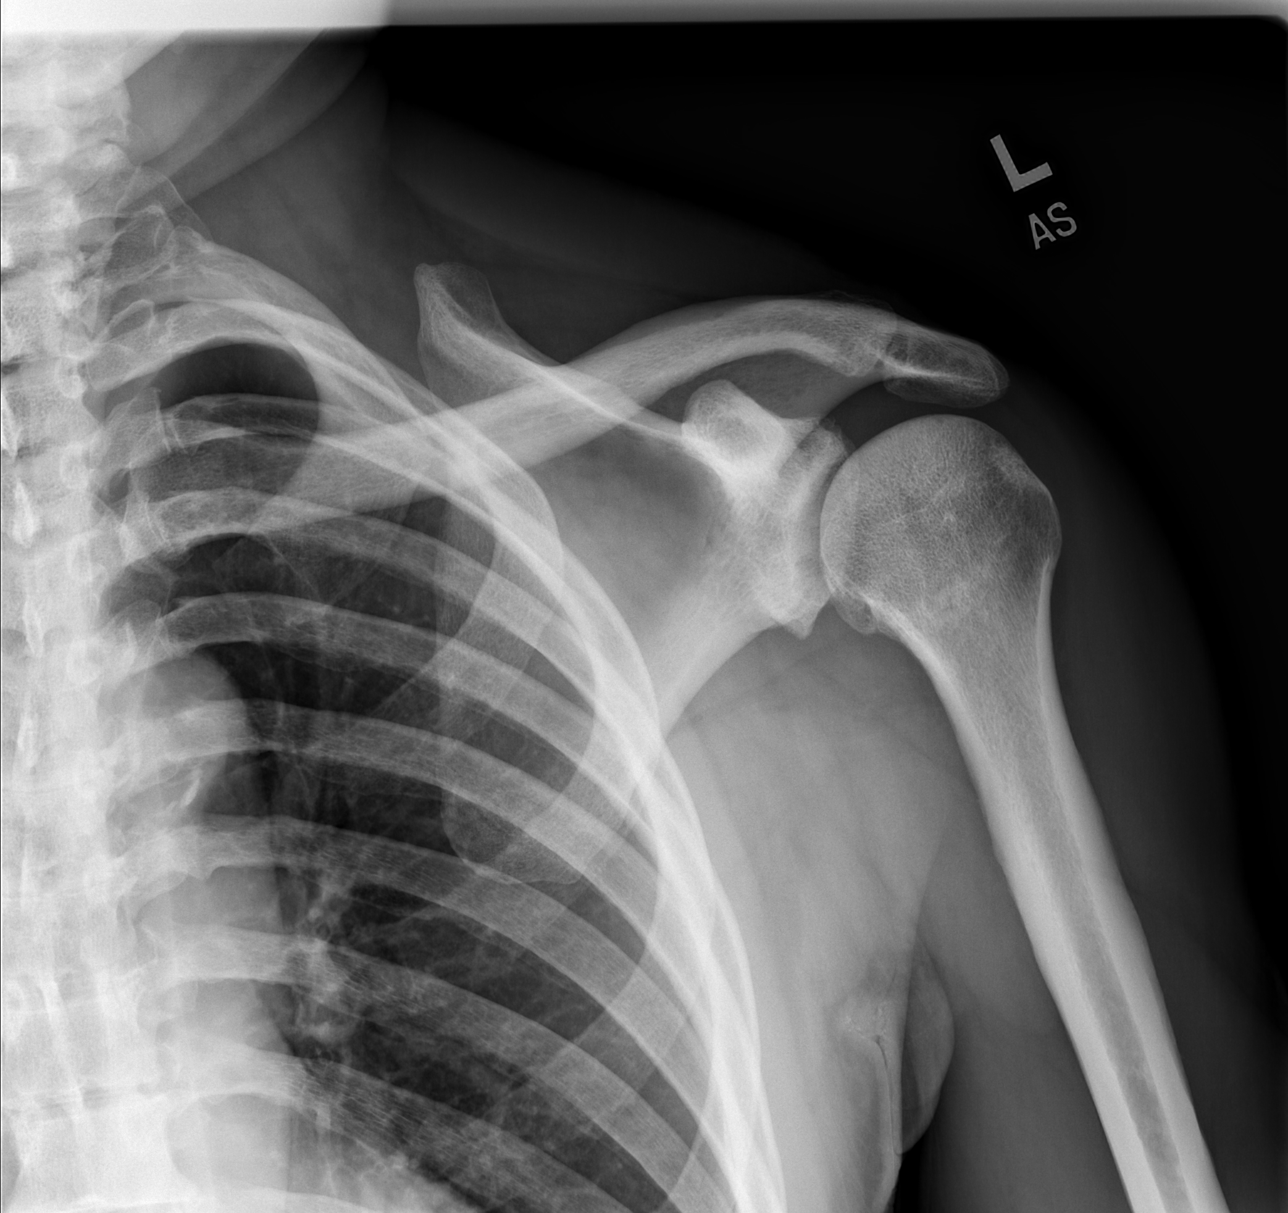

[w shoulder y view left]
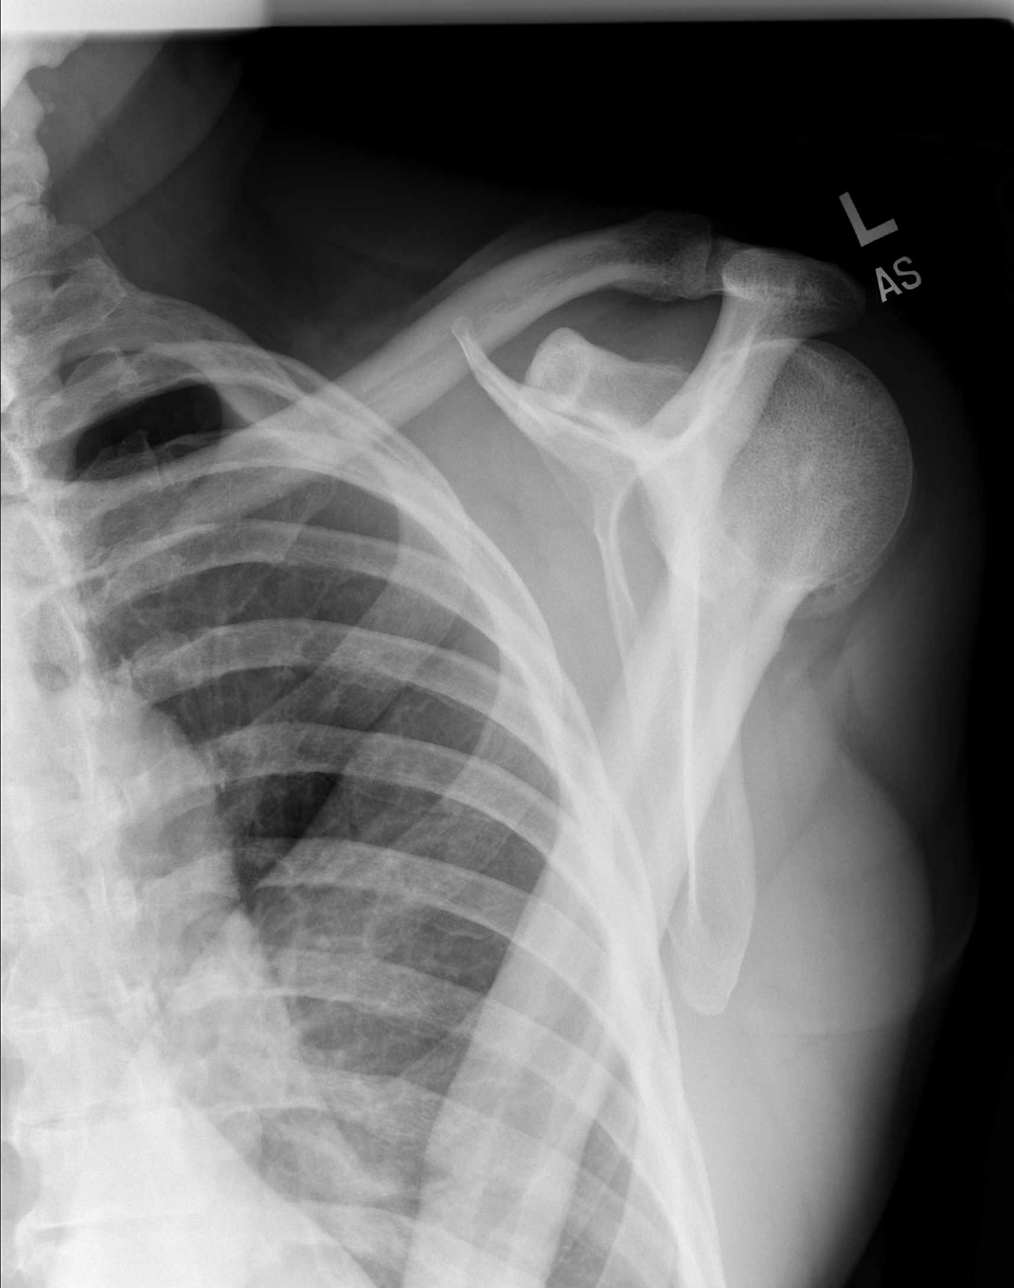

[w shoulder axillary left *]
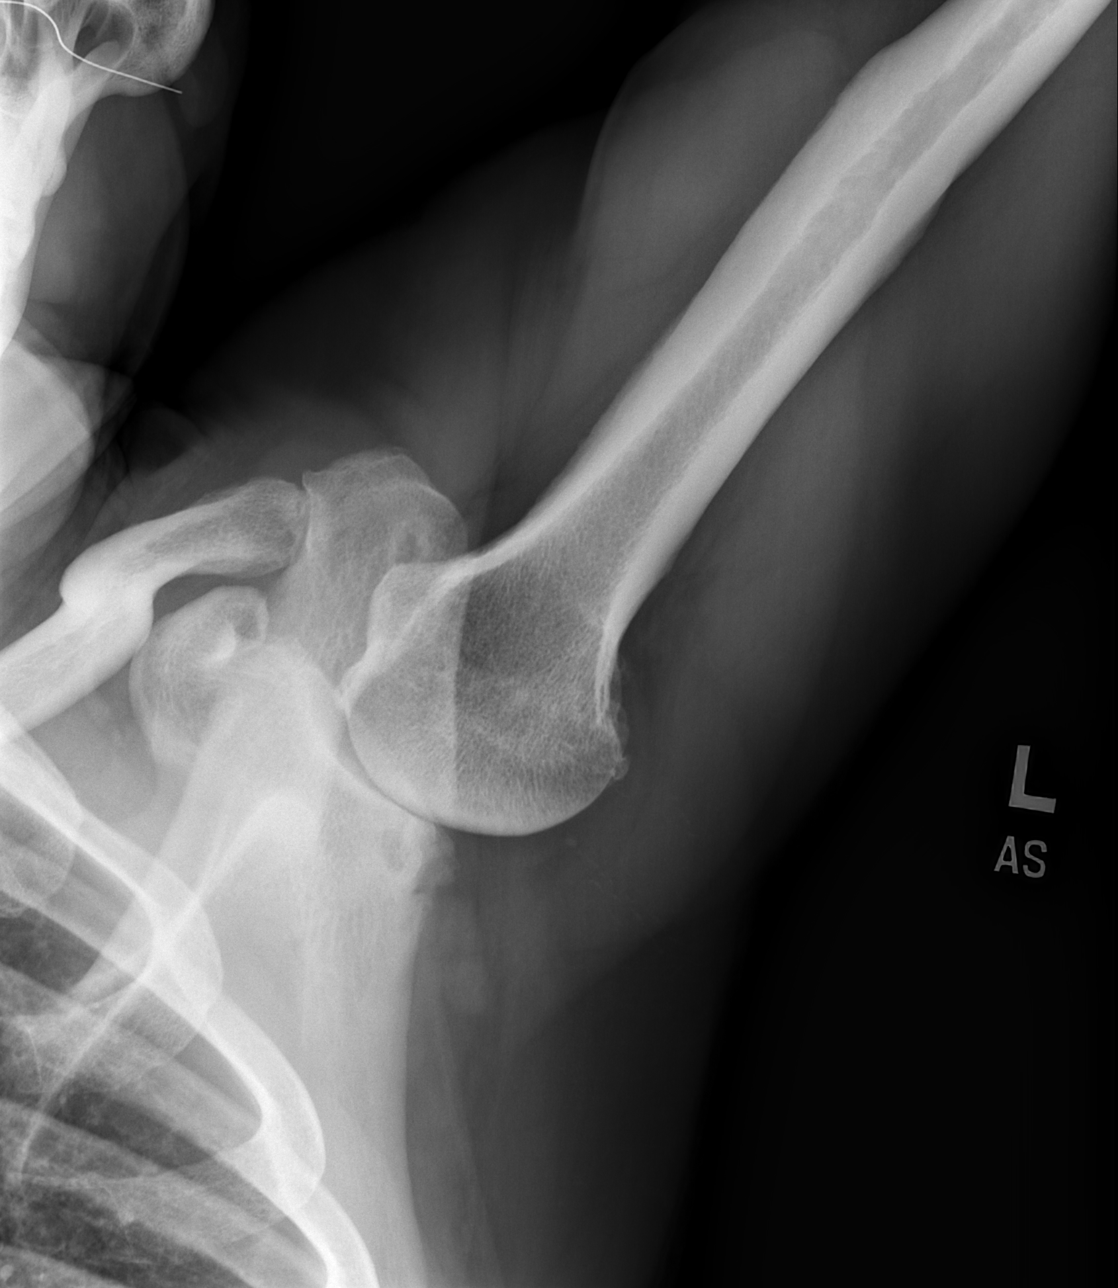

[3 of 3 positions shown; findings below may reference images not displayed]

FINDINGS: Normal bone mineralization. No evidence of fracture or dislocation.
There is slight spurring at the AC joint, mild AC joint narrowing.

More prominent narrowing and inferior spurring is seen at the
glenohumeral joint. There are subcortical cystic changes in the
inferior aspect of the glenoid likely degenerative in etiology.

There is preservation of the normal acromiohumeral space. Left upper
lung field is generally clear.
IMPRESSION: Nonerosive degenerative arthrosis. No acute abnormality
demonstrated.

## 2023-03-01 ENCOUNTER — Other Ambulatory Visit: Payer: Self-pay | Admitting: Critical Care Medicine

## 2023-03-03 ENCOUNTER — Other Ambulatory Visit: Payer: Self-pay | Admitting: Critical Care Medicine

## 2023-03-03 ENCOUNTER — Other Ambulatory Visit: Payer: Self-pay

## 2023-03-04 ENCOUNTER — Other Ambulatory Visit: Payer: Self-pay

## 2023-03-04 MED ORDER — ONETOUCH DELICA LANCETS 30G MISC
2 refills | Status: DC
  Filled 2023-03-04: qty 100, 50d supply, fill #0
  Filled 2023-05-06: qty 100, 50d supply, fill #1

## 2023-03-05 ENCOUNTER — Other Ambulatory Visit: Payer: Self-pay

## 2023-04-04 ENCOUNTER — Other Ambulatory Visit: Payer: Self-pay

## 2023-04-11 ENCOUNTER — Ambulatory Visit (INDEPENDENT_AMBULATORY_CARE_PROVIDER_SITE_OTHER): Payer: BC Managed Care – PPO | Admitting: Surgical

## 2023-04-11 ENCOUNTER — Encounter: Payer: Self-pay | Admitting: Surgical

## 2023-04-11 ENCOUNTER — Other Ambulatory Visit (INDEPENDENT_AMBULATORY_CARE_PROVIDER_SITE_OTHER): Payer: BC Managed Care – PPO

## 2023-04-11 DIAGNOSIS — M5412 Radiculopathy, cervical region: Secondary | ICD-10-CM | POA: Diagnosis not present

## 2023-04-11 DIAGNOSIS — M19012 Primary osteoarthritis, left shoulder: Secondary | ICD-10-CM

## 2023-04-11 NOTE — Progress Notes (Signed)
Office Visit Note   Patient: Timothy Warren           Date of Birth: December 31, 1960           MRN: [id number] Visit Date: 04/11/2023 Requested by: Storm Frisk, MD [address] [place],  [place] [address] PCP: Storm Frisk, MD  Subjective: Chief Complaint  Patient presents with   Left Shoulder - Pain    HPI: Timothy Warren is a 62 y.o. male who presents to the office reporting left shoulder pain.  Patient states that he is "ready to have surgery".  He describes fairly constant pain in the left shoulder.  He has pain at rest and wakes with pain at night.  Describes pain in the lateral aspect of the shoulder as well as the anterior, posterior, axillary aspects of the shoulder as well.  Has occasional trapezius pain and neck pain in addition to the other locations but the vast majority of his pain in this in the shoulder region.  He has switched jobs and is doing less lifting.  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  Taking Tylenol for pain control.  Has hypertension so he cannot take NSAIDs.  Has diabetes but overall this is fairly controlled with his blood sugar usually ranging from about 1 20-1 25.  Denies any history of CKD, heart disease, stroke, blood thinner use.  No history of prior surgery to the left shoulder.  Had glenohumeral injection that gave him great relief for about 2 to 3 weeks that was done back in 2023 but he states that this is not really worth repeating as it did not last long enough..                ROS: All systems reviewed are negative as they relate to the chief complaint within the history of present illness.  Patient denies fevers or chills.  Assessment & Plan: Visit Diagnoses:  1. Radiculopathy, cervical region   2. Primary osteoarthritis, left shoulder     Plan: Patient is a 62 year old male who presents for evaluation of left shoulder pain.  Has history of left shoulder osteoarthritis is quite severe as  noted with B2 glenoid on CT scan from last year.  He has switched jobs to doing Estate manager/land agent duties which is only involving lifting of max of 10 pounds.  States that even with this shift in his work, pain has not relented.  He has had prior glenohumeral injections that have given him good relief for short periods of time only about 2 to 3 weeks.  We discussed options available patient including living with his symptoms versus repeating injection versus surgical intervention such as consideration of less invasive surgery such as subacromial decompression and bicep tenodesis versus shoulder replacement.  He would like to proceed with shoulder replacement which is reasonable given the extent of osteoarthritis that he has and the lack of resolution with conservative treatment.  Likely will need reverse shoulder replacement given the lack of external rotation that he demonstrates on exam today.  We discussed the risks and benefits of the procedure including but not limited to the risk of nerve/blood vessel damage, continued shoulder stiffness, continued shoulder pain, infection, need for revision surgery, medical complication from surgery such as stroke, heart attack, death.  After discussion of the recovery timeframe and the risk/benefits, he would like to proceed.  Plan to order thin cut CT scan of the left shoulder  for preoperative planning purposes and follow-up after CT scan to review results with [name] and posterior surgery.  Also plan to order MRI of the cervical spine to further evaluate for other potential causes of shoulder pain given the distribution of pain in his neck and trapezius region.  He does have a fair amount of degenerative changes on his cervical spine radiographs taken today.  Follow-Up Instructions: No follow-ups on file.   Orders:  Orders Placed This Encounter  Procedures   XR Cervical Spine 2 or 3 views   CT SHOULDER LEFT WO CONTRAST   MR Cervical Spine w/o contrast   No orders of  the defined types were placed in this encounter.     Procedures: No procedures performed   Clinical Data: No additional findings.  Objective: Vital Signs: There were no vitals taken for this visit.  Physical Exam:  Constitutional: Patient appears well-developed HEENT:  Head: Normocephalic Eyes:EOM are normal Neck: Normal range of motion Cardiovascular: Normal rate Pulmonary/chest: Effort normal Neurologic: Patient is alert Skin: Skin is warm Psychiatric: Patient has normal mood and affect  Ortho Exam: Ortho exam demonstrates left shoulder with 10 degrees X rotation, 80 degrees abduction, 100 degrees forward elevation passively and actively.  Axillary nerve intact with deltoid firing.  He has excellent external rotation, supraspinatus, subscapularis strength rated 5/5.  Does have moderate tenderness over the bicipital groove.  No tenderness over the [facility] joint.  Intact EPL, FPL, finger adduction, pronation/supination, bicep tricep, deltoid.  No cellulitis or skin changes noted over the shoulder.  2+ radial pulse of the left upper extremity.  Specialty Comments:  No specialty comments available.  Imaging: No results found.   PMFS History: Patient Active Problem List   Diagnosis Date Noted   Onychomycosis 07/30/2022   Chronic gout without tophus 07/30/2022   Primary osteoarthritis, left shoulder 11/13/2021   Chronic right shoulder pain 10/16/2021   Hyperlipidemia due to type 2 diabetes mellitus (HCC) 05/28/2021   Type 2 diabetes mellitus without complication, without long-term current use of insulin (HCC) 08/12/2020   Stage 3a chronic kidney disease (HCC) 08/12/2020   Erectile dysfunction associated with type 2 diabetes mellitus (HCC) 08/12/2020   Hypertension 02/17/2020   Past Medical History:  Diagnosis Date   Allergy    Chronic kidney disease    stage 2 CKD   Diabetes mellitus without complication (HCC)    Hyperlipidemia    Hypertension    Seasonal allergies      Family History  Problem Relation Age of Onset   Hypertension Mother    Hypertension Father    Colon cancer Neg Hx    Colon polyps Neg Hx    Esophageal cancer Neg Hx    Rectal cancer Neg Hx    Stomach cancer Neg Hx     Past Surgical History:  Procedure Laterality Date   NO PAST SURGERIES     Social History   Occupational History   Not on file  Tobacco Use   Smoking status: Never   Smokeless tobacco: Never  Vaping Use   Vaping Use: Never used  Substance and Sexual Activity   Alcohol use: Not Currently    Alcohol/week: 1.0 standard drink of alcohol    Types: 1 Cans of beer per week    Comment: SVERAL TIMES AWEEK.   Drug use: Yes    Types: Marijuana    Comment: Daily   Sexual activity: Yes

## 2023-04-19 ENCOUNTER — Ambulatory Visit
Admission: RE | Admit: 2023-04-19 | Discharge: 2023-04-19 | Disposition: A | Discharge status: Home / Self Care | Payer: BC Managed Care – PPO | Source: Ambulatory Visit | Attending: Surgical | Admitting: Surgical

## 2023-04-19 DIAGNOSIS — M5412 Radiculopathy, cervical region: Secondary | ICD-10-CM

## 2023-04-19 DIAGNOSIS — M4802 Spinal stenosis, cervical region: Secondary | ICD-10-CM | POA: Diagnosis not present

## 2023-04-22 ENCOUNTER — Encounter: Payer: Self-pay | Admitting: *Deleted

## 2023-04-28 NOTE — Progress Notes (Signed)
Hi Timothy Warren has a lot of pathology in his neck that could be causing shoulder pain.  Can you make him an appointment for cervical spine ESI with [name] and an appointment with 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  Discussed patient with [name] today.  Also recommended that he no cancel his upcoming CT  scan of the left shoulder until we know the status of his neck.

## 2023-04-29 ENCOUNTER — Telehealth: Payer: Self-pay | Admitting: Orthopedic Surgery

## 2023-04-29 ENCOUNTER — Other Ambulatory Visit: Payer: Self-pay

## 2023-04-29 DIAGNOSIS — M5412 Radiculopathy, cervical region: Secondary | ICD-10-CM

## 2023-04-29 NOTE — Telephone Encounter (Signed)
Mailed reminder letter to patient today 

## 2023-05-06 ENCOUNTER — Other Ambulatory Visit: Payer: Self-pay

## 2023-05-22 ENCOUNTER — Other Ambulatory Visit: Payer: BC Managed Care – PPO

## 2023-05-22 ENCOUNTER — Ambulatory Visit (INDEPENDENT_AMBULATORY_CARE_PROVIDER_SITE_OTHER): Payer: BC Managed Care – PPO | Admitting: Physical Medicine and Rehabilitation

## 2023-05-22 ENCOUNTER — Other Ambulatory Visit: Payer: Self-pay

## 2023-05-22 VITALS — BP 119/77 | HR 62

## 2023-05-22 DIAGNOSIS — M5412 Radiculopathy, cervical region: Secondary | ICD-10-CM

## 2023-05-22 MED ORDER — METHYLPREDNISOLONE ACETATE 80 MG/ML IJ SUSP
80.0000 mg | Freq: Once | INTRAMUSCULAR | Status: AC
  Administered 2023-05-22: 80 mg

## 2023-05-22 NOTE — Progress Notes (Signed)
Functional Pain Scale - descriptive words and definitions  Distressing (6)    Pain is present/unable to complete most ADLs limited by pain/sleep is difficult and active distraction is only marginal. Moderate range order  Average Pain 7-9   +Driver, -BT, -Dye Allergies.  Neck pain on left side that radiates into left shoulder

## 2023-05-22 NOTE — Patient Instructions (Signed)

## 2023-05-29 ENCOUNTER — Ambulatory Visit (INDEPENDENT_AMBULATORY_CARE_PROVIDER_SITE_OTHER): Payer: BC Managed Care – PPO | Admitting: Orthopedic Surgery

## 2023-05-29 ENCOUNTER — Other Ambulatory Visit (INDEPENDENT_AMBULATORY_CARE_PROVIDER_SITE_OTHER): Payer: BC Managed Care – PPO

## 2023-05-29 DIAGNOSIS — M5412 Radiculopathy, cervical region: Secondary | ICD-10-CM | POA: Diagnosis not present
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
## 2023-06-03 NOTE — Procedures (Signed)
Cervical Epidural Steroid Injection - Interlaminar Approach with Fluoroscopic Guidance  Patient: Timothy Warren      Date of Birth: 1960-11-11 MRN: [id number] PCP: Storm Frisk, MD      Visit Date: 05/22/2023   Universal Protocol:    Date/Time: 07/30/248:47 PM  Consent Given By: the patient  Position: PRONE  Additional Comments: Vital signs were monitored before and after the procedure. Patient was prepped and draped in the usual sterile fashion. The correct patient, procedure, and site was verified.   Injection Procedure Details:   Procedure diagnoses: Radiculopathy, cervical region [M54.12]    Meds Administered:  Meds ordered this encounter  Medications   methylPREDNISolone acetate (DEPO-MEDROL) injection 80 mg     Laterality: Left  Location/Site: C7-T1  Needle: 3.5 in., 20 ga. Tuohy  Needle Placement: Paramedian epidural space  Findings:  -Comments: Excellent flow of contrast into the epidural space.  Procedure Details: Using a paramedian approach from the side mentioned above, the region overlying the inferior lamina was localized under fluoroscopic visualization and the soft tissues overlying this structure were infiltrated with 4 ml. of 1% Lidocaine without Epinephrine. A # 20 gauge, Tuohy needle was inserted into the epidural space using a paramedian approach.  The epidural space was localized using loss of resistance along with contralateral oblique bi-planar fluoroscopic views.  After negative aspirate for air, blood, and CSF, a 2 ml. volume of Isovue-250 was injected into the epidural space and the flow of contrast was observed. Radiographs were obtained for documentation purposes.   The injectate was administered into the level noted above.  Additional Comments:  No complications occurred Dressing: 2 x 2 sterile gauze and Band-Aid    Post-procedure details: Patient was observed during the procedure. Post-procedure instructions were  reviewed.  Patient left the clinic in stable condition.

## 2023-06-03 NOTE — Progress Notes (Signed)
Timothy Warren - 62 y.o. male MRN [id number]  Date of birth: 04/10/1961  Office Visit Note: Visit Date: 05/22/2023 PCP: Storm Frisk, MD Referred by: Storm Frisk, MD  Subjective: Chief Complaint  Patient presents with   Neck - Pain   HPI:  Timothy Warren is a 62 y.o. male who comes in today at the request of Dr. Willia Craze for planned Left C7-T1 Cervical Interlaminar epidural steroid injection with fluoroscopic guidance.  The patient has failed conservative care including home exercise, medications, time and activity modification.  This injection will be diagnostic and hopefully therapeutic.  Please see requesting physician notes for further details and justification.   ROS Otherwise per HPI.  Assessment & Plan: Visit Diagnoses:    ICD-10-CM   1. Radiculopathy, cervical region  M54.12 XR C-ARM NO REPORT    Epidural Steroid injection    methylPREDNISolone acetate (DEPO-MEDROL) injection 80 mg      Plan: No additional findings.   Meds & Orders:  Meds ordered this encounter  Medications   methylPREDNISolone acetate (DEPO-MEDROL) injection 80 mg    Orders Placed This Encounter  Procedures   XR C-ARM NO REPORT   Epidural Steroid injection    Follow-up: Return for visit to requesting provider as needed.   Procedures: No procedures performed  Cervical Epidural Steroid Injection - Interlaminar Approach with Fluoroscopic Guidance  Patient: Timothy Warren      Date of Birth: 1961/07/11 MRN: [id number] PCP: Storm Frisk, MD      Visit Date: 05/22/2023   Universal Protocol:    Date/Time: 07/30/248:47 PM  Consent Given By: the patient  Position: PRONE  Additional Comments: Vital signs were monitored before and after the procedure. Patient was prepped and draped in the usual sterile fashion. The correct patient, procedure, and site was verified.   Injection Procedure Details:   Procedure diagnoses: Radiculopathy, cervical region [M54.12]    Meds  Administered:  Meds ordered this encounter  Medications   methylPREDNISolone acetate (DEPO-MEDROL) injection 80 mg     Laterality: Left  Location/Site: C7-T1  Needle: 3.5 in., 20 ga. Tuohy  Needle Placement: Paramedian epidural space  Findings:  -Comments: Excellent flow of contrast into the epidural space.  Procedure Details: Using a paramedian approach from the side mentioned above, the region overlying the inferior lamina was localized under fluoroscopic visualization and the soft tissues overlying this structure were infiltrated with 4 ml. of 1% Lidocaine without Epinephrine. A # 20 gauge, Tuohy needle was inserted into the epidural space using a paramedian approach.  The epidural space was localized using loss of resistance along with contralateral oblique bi-planar fluoroscopic views.  After negative aspirate for air, blood, and CSF, a 2 ml. volume of Isovue-250 was injected into the epidural space and the flow of contrast was observed. Radiographs were obtained for documentation purposes.   The injectate was administered into the level noted above.  Additional Comments:  No complications occurred Dressing: 2 x 2 sterile gauze and Band-Aid    Post-procedure details: Patient was observed during the procedure. Post-procedure instructions were reviewed.  Patient left the clinic in stable condition.   Clinical History: MRI CERVICAL SPINE WITHOUT CONTRAST   TECHNIQUE: Multiplanar, multisequence MR imaging of the cervical spine was performed. No intravenous contrast was administered.   COMPARISON:  None Available.   FINDINGS: Alignment: Physiologic.   Vertebrae: Multilevel chronic height loss, greatest at C3-C5. No acute fracture. No discitis-osteomyelitis.   Cord: There is diffuse volume loss  of the spinal cord with multifocal hyperintense T2-weighted signal consistent with myelomalacia.   Posterior Fossa, vertebral arteries, paraspinal tissues: Negative.    Disc levels:   C1-2: Unremarkable.   C2-3: Small disc osteophyte complex, right asymmetric. There is no spinal canal stenosis. Moderate right neural foraminal stenosis.   C3-4: Left asymmetric disc bulge with uncovertebral osteophytes. There is no spinal canal stenosis. Severe left neural foraminal stenosis.   C4-5: Small disc bulge with uncovertebral osteophytes. There is no spinal canal stenosis. Moderate right and severe left neural foraminal stenosis.   C5-6: Intermediate sized left foraminal disc osteophyte complex and right uncovertebral hypertrophy. Mild spinal canal stenosis. Severe left neural foraminal stenosis.   C6-7: Normal disc space and facet joints. There is no spinal canal stenosis. No neural foraminal stenosis.   C7-T1: Disc space narrowing. There is no spinal canal stenosis. No neural foraminal stenosis.   IMPRESSION: 1. Diffuse volume loss of the spinal cord with multifocal hyperintense T2-weighted signal consistent with myelomalacia. 2. Severe left C3-4, C4-5 and C5-6 neural foraminal stenosis. 3. Moderate right C2-3 and C4-5 neural foraminal stenosis.     Electronically Signed   By: Deatra Robinson M.D.   On: 04/27/2023 23:59     Objective:  VS:  HT:    WT:   BMI:     BP:119/77  HR:62bpm  TEMP: ( )  RESP:  Physical Exam Vitals and nursing note reviewed.  Constitutional:      General: He is not in acute distress.    Appearance: Normal appearance. He is not ill-appearing.  HENT:     Head: Normocephalic and atraumatic.     Right Ear: External ear normal.     Left Ear: External ear normal.  Eyes:     Extraocular Movements: Extraocular movements intact.  Cardiovascular:     Rate and Rhythm: Normal rate.     Pulses: Normal pulses.  Abdominal:     General: There is no distension.     Palpations: Abdomen is soft.  Musculoskeletal:        General: No signs of injury.     Cervical back: Neck supple. Tenderness present. No rigidity.      Right lower leg: No edema.     Left lower leg: No edema.     Comments: Patient has good strength in the upper extremities with 5 out of 5 strength in wrist extension long finger flexion APB.  No intrinsic hand muscle atrophy.  Negative Hoffmann's test.  Lymphadenopathy:     Cervical: No cervical adenopathy.  Skin:    Findings: No erythema or rash.  Neurological:     General: No focal deficit present.     Mental Status: He is alert and oriented to person, place, and time.     Sensory: No sensory deficit.     Motor: No weakness or abnormal muscle tone.     Coordination: Coordination normal.  Psychiatric:        Mood and Affect: Mood normal.        Behavior: Behavior normal.      Imaging: No results found.

## 2023-06-08 NOTE — Progress Notes (Deleted)
Established Patient Office Visit  Subjective:  Patient ID: Timothy Warren, male    DOB: 1961-04-29  Age: 62 y.o. MRN: [id number]  CC:  No chief complaint on file.   HPI 09/2021 Timothy Warren presents for hypertension and type 2 diabetes.  Note on arrival blood pressure still quite elevated and he is on amlodipine 5 mg daily hydrochlorothiazide 25 mg daily.  Note he has been eating quite a bit of salt in the diet.  His A1c is 6.6 and he is compliant with his metformin.  He is also on the atorvastatin daily.  He brings in his ophthalmology exam result and he had 20/20 vision and no evidence of retinopathy seen.  Patient does have a colonoscopy that is going to be completed soon.  The patient did receive his flu vaccine   Patient is not been able to pay for his diabetic testing supplies his insurance plan will not cover it also he has had difficulty with co-pays on the Viagra.He is planning on getting a new insurance plan in the near and also will get vision coverage  4/24 Patient returns in short-term follow-up on arrival blood pressure elevated 157/80 blood sugar 171 A1c however is good at 6.3  Patient did have a colonoscopy in March of this year does not have to have repeat in 7 years I found 1 small polyp on the patient.  Patient states he has been under some degree of stress has not been eating very healthy at times.  He is taking Viagra for erectile dysfunction and this is helped him Patient has no other complaints  6/26 Patient seen in return follow-up overall doing well decreasing his content of sweets in terms of intake.  On arrival blood sugar is 108 blood pressure 120/75.  He has no other complaints he does need to have his renal panel rechecked.  He has been following closely with clinical pharmacy.  9/26 Patient seen in return follow-up and doing well he was seen in August for gout flare of the left foot and was given colchicine if needed he is not taking it now.  Uric acid  was elevated.  A1c was 6.2 during last visit.  Blood pressure on arrival 116/74.   12/10/22 This patient was seen in return follow-up he has been doing very well his A1c is well-controlled less than 7 blood pressure is excellent at 130/68.  He could not afford Trulicity he has been controlling his diabetes with metformin alone.  He does need an eye exam.  There are no other complaints except for some swelling at the base of the left thumb  8/6    Past Medical History:  Diagnosis Date   Allergy    Chronic kidney disease    stage 2 CKD   Diabetes mellitus without complication (HCC)    Hyperlipidemia    Hypertension    Seasonal allergies     Past Surgical History:  Procedure Laterality Date   NO PAST SURGERIES      Family History  Problem Relation Age of Onset   Hypertension Mother    Hypertension Father    Colon cancer Neg Hx    Colon polyps Neg Hx    Esophageal cancer Neg Hx    Rectal cancer Neg Hx    Stomach cancer Neg Hx     Social History   Socioeconomic History   Marital status: Significant Other    Spouse name: Not on file   Number of children: Not  on file   Years of education: Not on file   Highest education level: Not on file  Occupational History   Not on file  Tobacco Use   Smoking status: Never   Smokeless tobacco: Never  Vaping Use   Vaping status: Never Used  Substance and Sexual Activity   Alcohol use: Not Currently    Alcohol/week: 1.0 standard drink of alcohol    Types: 1 Cans of beer per week    Comment: SVERAL TIMES AWEEK.   Drug use: Yes    Types: Marijuana    Comment: Daily   Sexual activity: Yes  Other Topics Concern   Not on file  Social History Narrative   Not on file   Social Determinants of Health   Financial Resource Strain: Not on file  Food Insecurity: Not on file  Transportation Needs: Not on file  Physical Activity: Not on file  Stress: Not on file  Social Connections: Unknown (03/19/2022)   Received from [facility]   Social Network    Social Network: Not on file  Intimate Partner Violence: Unknown (02/08/2022)   Received from [facility]   HITS    Physically Hurt: Not on file    Insult or Talk Down To: Not on file    Threaten Physical Harm: Not on file    Scream or Curse: Not on file    Outpatient Medications Prior to Visit  Medication Sig Dispense Refill   amLODipine (NORVASC) 10 MG tablet Take 1 tablet (10 mg total) by mouth daily. To lower blood pressure 90 tablet 2   atorvastatin (LIPITOR) 20 MG tablet Take 1 tablet (20 mg total) by mouth daily. 90 tablet 3   Blood Glucose Monitoring Suppl (ONETOUCH VERIO) w/Device KIT Use to check blood sugar twice a day 1 kit 0   glucose blood (ONETOUCH VERIO) test strip Use to check blood sugar twice daily. 100 each 2   hydrochlorothiazide (HYDRODIURIL) 25 MG tablet Take 1 tablet (25 mg total) by mouth daily. 90 tablet 3   metFORMIN (GLUCOPHAGE) 500 MG tablet Take 1 tablet (500 mg total) by mouth 2 (two) times daily with a meal. 180 tablet 3   OneTouch Delica Lancets 30G MISC Check blood sugar twice a day 100 each 2   sildenafil (VIAGRA) 50 MG tablet TAKE 1 TABLET (50 MG TOTAL) BY MOUTH DAILY AS NEEDED FOR ERECTILE DYSFUNCTION. 10 tablet 2   No facility-administered medications prior to visit.    No Known Allergies  ROS Review of Systems  Constitutional:  Negative for chills, diaphoresis and fever.  HENT:  Negative for congestion, hearing loss, nosebleeds, sore throat and tinnitus.   Eyes:  Negative for photophobia and redness.  Respiratory:  Negative for cough, shortness of breath, wheezing and stridor.   Cardiovascular:  Negative for chest pain, palpitations and leg swelling.  Gastrointestinal:  Negative for abdominal pain, blood in stool, constipation, diarrhea, nausea and vomiting.  Endocrine: Negative for polydipsia.  Genitourinary:  Negative for dysuria, flank pain, frequency, hematuria and urgency.  Musculoskeletal:  Negative for back  pain, myalgias and neck pain.       Swelling left thumb nontender  Skin:  Negative for rash.  Allergic/Immunologic: Negative for environmental allergies.  Neurological:  Negative for dizziness, tremors, seizures, weakness and headaches.  Hematological:  Does not bruise/bleed easily.  Psychiatric/Behavioral:  Negative for suicidal ideas. The patient is not nervous/anxious.       Objective:    Physical Exam Vitals reviewed.  Constitutional:  Appearance: Normal appearance. He is well-developed. He is not diaphoretic.  HENT:     Head: Normocephalic and atraumatic.     Nose: Nose normal. No nasal deformity, septal deviation, mucosal edema or rhinorrhea.     Right Sinus: No maxillary sinus tenderness or frontal sinus tenderness.     Left Sinus: No maxillary sinus tenderness or frontal sinus tenderness.     Mouth/Throat:     Mouth: Mucous membranes are moist.     Pharynx: Oropharynx is clear. No oropharyngeal exudate.  Eyes:     General: No scleral icterus.    Conjunctiva/sclera: Conjunctivae normal.     Pupils: Pupils are equal, round, and reactive to light.  Neck:     Thyroid: No thyromegaly.     Vascular: No carotid bruit or JVD.     Trachea: Trachea normal. No tracheal tenderness or tracheal deviation.  Cardiovascular:     Rate and Rhythm: Normal rate and regular rhythm.     Chest Wall: PMI is not displaced.     Pulses: Normal pulses. No decreased pulses.     Heart sounds: Normal heart sounds, S1 normal and S2 normal. Heart sounds not distant. No murmur heard.    No systolic murmur is present.     No diastolic murmur is present.     No friction rub. No gallop. No S3 or S4 sounds.  Pulmonary:     Effort: Pulmonary effort is normal. No tachypnea, accessory muscle usage or respiratory distress.     Breath sounds: Normal breath sounds. No stridor. No decreased breath sounds, wheezing, rhonchi or rales.  Chest:     Chest wall: No tenderness.  Abdominal:     General: Bowel  sounds are normal. There is no distension.     Palpations: Abdomen is soft. Abdomen is not rigid.     Tenderness: There is no abdominal tenderness. There is no guarding or rebound.  Musculoskeletal:        General: Swelling present. Normal range of motion.     Cervical back: Normal range of motion and neck supple. No edema, erythema or rigidity. No muscular tenderness. Normal range of motion.     Comments: Mild protuberance at the base of the left thumb likely from prior injury will monitor  Lymphadenopathy:     Head:     Right side of head: No submental or submandibular adenopathy.     Left side of head: No submental or submandibular adenopathy.     Cervical: No cervical adenopathy.  Skin:    General: Skin is warm and dry.     Coloration: Skin is not pale.     Findings: No rash.     Nails: There is no clubbing.  Neurological:     Mental Status: He is alert and oriented to person, place, and time.     Sensory: No sensory deficit.  Psychiatric:        Mood and Affect: Mood normal.        Speech: Speech normal.        Behavior: Behavior normal.        Thought Content: Thought content normal.        Judgment: Judgment normal.     There were no vitals taken for this visit. Wt Readings from Last 3 Encounters:  12/10/22 158 lb 9.6 oz (71.9 kg)  07/30/22 157 lb 9.6 oz (71.5 kg)  07/09/22 154 lb (69.9 kg)     Health Maintenance Due  Topic Date Due  Zoster Vaccines- Shingrix (2 of 2) 04/23/2022   COVID-19 Vaccine (3 - 2023-24 season) 07/05/2022   OPHTHALMOLOGY EXAM  08/16/2022   Diabetic kidney evaluation - Urine ACR  02/26/2023   INFLUENZA VACCINE  06/05/2023    There are no preventive care reminders to display for this patient.  No results found for: "TSH" Lab Results  Component Value Date   WBC 8.6 06/13/2022   HGB 12.7 (L) 06/13/2022   HCT 38.5 06/13/2022   MCV 83 06/13/2022   PLT 268 06/13/2022   Lab Results  Component Value Date   NA 140 12/10/2022   K 3.8  12/10/2022   CO2 25 12/10/2022   GLUCOSE 99 12/10/2022   BUN 30 (H) 12/10/2022   CREATININE 1.74 (H) 12/10/2022   BILITOT 0.3 03/26/2022   ALKPHOS 60 03/26/2022   AST 27 03/26/2022   ALT 20 03/26/2022   PROT 6.9 03/26/2022   ALBUMIN 4.5 04/29/2022   CALCIUM 9.9 12/10/2022   ANIONGAP 9 01/17/2020   EGFR 44 (L) 12/10/2022   Lab Results  Component Value Date   CHOL 145 02/25/2022   Lab Results  Component Value Date   HDL 57 02/25/2022   Lab Results  Component Value Date   LDLCALC 44 02/25/2022   Lab Results  Component Value Date   TRIG 293 (H) 02/25/2022   Lab Results  Component Value Date   CHOLHDL 2.5 02/25/2022   Lab Results  Component Value Date   HGBA1C 6.3 (A) 12/10/2022      Assessment & Plan:   Problem List Items Addressed This Visit   None   No orders of the defined types were placed in this encounter. Follow-up: No follow-ups on file.    Shan Levans, MD

## 2023-06-10 ENCOUNTER — Ambulatory Visit: Payer: 59 | Admitting: Critical Care Medicine

## 2023-06-10 ENCOUNTER — Other Ambulatory Visit: Payer: Self-pay

## 2023-06-13 ENCOUNTER — Other Ambulatory Visit: Payer: Self-pay

## 2023-06-13 ENCOUNTER — Ambulatory Visit: Payer: BC Managed Care – PPO | Attending: Critical Care Medicine | Admitting: Critical Care Medicine

## 2023-06-13 VITALS — BP 117/78 | HR 62 | Temp 98.6°F | Ht 69.0 in | Wt 159.0 lb

## 2023-06-13 DIAGNOSIS — E1169 Type 2 diabetes mellitus with other specified complication: Secondary | ICD-10-CM

## 2023-06-13 DIAGNOSIS — E119 Type 2 diabetes mellitus without complications: Secondary | ICD-10-CM | POA: Diagnosis not present

## 2023-06-13 DIAGNOSIS — E11630 Type 2 diabetes mellitus with periodontal disease: Secondary | ICD-10-CM

## 2023-06-13 DIAGNOSIS — E785 Hyperlipidemia, unspecified: Secondary | ICD-10-CM | POA: Diagnosis not present

## 2023-06-13 DIAGNOSIS — I10 Essential (primary) hypertension: Secondary | ICD-10-CM | POA: Diagnosis not present

## 2023-06-13 DIAGNOSIS — N189 Chronic kidney disease, unspecified: Secondary | ICD-10-CM | POA: Diagnosis not present

## 2023-06-13 DIAGNOSIS — N1831 Chronic kidney disease, stage 3a: Secondary | ICD-10-CM

## 2023-06-13 DIAGNOSIS — Z7984 Long term (current) use of oral hypoglycemic drugs: Secondary | ICD-10-CM

## 2023-06-13 DIAGNOSIS — N521 Erectile dysfunction due to diseases classified elsewhere: Secondary | ICD-10-CM

## 2023-06-13 MED ORDER — ATORVASTATIN CALCIUM 20 MG PO TABS: 20.0000 mg | ORAL_TABLET | Freq: Every day | ORAL | 3 refills | Status: DC

## 2023-06-13 MED ORDER — AMLODIPINE BESYLATE 10 MG PO TABS
10.0000 mg | ORAL_TABLET | Freq: Every day | ORAL | 2 refills | Status: DC
Start: 2023-06-13 — End: 2024-04-23
  Filled 2023-06-13 – 2023-07-15 (×2): qty 90, 90d supply, fill #0
  Filled 2023-10-20: qty 90, 90d supply, fill #1
  Filled 2024-01-20: qty 90, 90d supply, fill #2

## 2023-06-13 MED ORDER — METFORMIN HCL 500 MG PO TABS
500.0000 mg | ORAL_TABLET | Freq: Two times a day (BID) | ORAL | 3 refills | Status: DC
Start: 2023-06-13 — End: 2024-04-30
  Filled 2023-06-13 – 2023-07-15 (×2): qty 180, 90d supply, fill #0
  Filled 2023-10-20: qty 180, 90d supply, fill #1
  Filled 2024-01-20: qty 180, 90d supply, fill #2
  Filled 2024-04-23: qty 180, 90d supply, fill #3

## 2023-06-13 MED ORDER — SILDENAFIL CITRATE 100 MG PO TABS: 100.0000 mg | ORAL_TABLET | Freq: Every day | ORAL | 2 refills | Status: DC | PRN

## 2023-06-13 MED ORDER — ONETOUCH VERIO VI STRP: ORAL_STRIP | 2 refills | Status: DC

## 2023-06-13 MED ORDER — HYDROCHLOROTHIAZIDE 25 MG PO TABS: 25.0000 mg | ORAL_TABLET | Freq: Every day | ORAL | 3 refills | Status: DC

## 2023-06-13 MED ORDER — AMLODIPINE BESYLATE 10 MG PO TABS
10.0000 mg | ORAL_TABLET | Freq: Every day | ORAL | 2 refills | Status: DC
Start: 2023-06-13 — End: 2023-06-13

## 2023-06-13 MED ORDER — METFORMIN HCL 500 MG PO TABS
500.0000 mg | ORAL_TABLET | Freq: Two times a day (BID) | ORAL | 3 refills | Status: DC
Start: 2023-06-13 — End: 2023-06-13

## 2023-06-13 MED ORDER — ATORVASTATIN CALCIUM 20 MG PO TABS
20.0000 mg | ORAL_TABLET | Freq: Every day | ORAL | 3 refills | Status: DC
  Filled 2023-06-13 – 2023-07-15 (×2): qty 90, 90d supply, fill #0
  Filled 2023-10-20: qty 90, 90d supply, fill #1
  Filled 2024-01-20: qty 90, 90d supply, fill #2
  Filled 2024-04-23: qty 90, 90d supply, fill #3

## 2023-06-13 MED ORDER — ONETOUCH VERIO VI STRP
ORAL_STRIP | 2 refills | Status: DC
  Filled 2023-06-13: qty 100, 50d supply, fill #0
  Filled 2023-11-24: qty 100, 50d supply, fill #1
  Filled 2024-01-20: qty 100, 50d supply, fill #2

## 2023-06-13 MED ORDER — ONETOUCH DELICA LANCETS 33G MISC
3 refills | Status: AC
  Filled 2023-06-13: qty 100, 50d supply, fill #0
  Filled 2023-11-24: qty 100, 50d supply, fill #1
  Filled 2024-01-20 – 2024-03-03 (×2): qty 100, 50d supply, fill #2

## 2023-06-13 MED ORDER — ONETOUCH DELICA LANCETS 30G MISC: 2 refills | Status: DC

## 2023-06-13 MED ORDER — SILDENAFIL CITRATE 100 MG PO TABS
100.0000 mg | ORAL_TABLET | Freq: Every day | ORAL | 2 refills | Status: DC | PRN
  Filled 2023-06-13: qty 20, 20d supply, fill #0
  Filled 2023-07-31: qty 20, 20d supply, fill #1
  Filled 2023-10-31: qty 20, 20d supply, fill #2

## 2023-06-13 MED ORDER — HYDROCHLOROTHIAZIDE 25 MG PO TABS
25.0000 mg | ORAL_TABLET | Freq: Every day | ORAL | 3 refills | Status: DC
  Filled 2023-06-13 – 2023-07-15 (×2): qty 90, 90d supply, fill #0
  Filled 2023-10-20: qty 90, 90d supply, fill #1
  Filled 2024-01-20: qty 90, 90d supply, fill #2
  Filled 2024-04-23: qty 90, 90d supply, fill #3

## 2023-06-13 NOTE — Patient Instructions (Signed)
No change in medications, refills sent, your viagra was increased Get eye exam this year Dental referral sent Labs today Return 5 months for new primary care MD

## 2023-06-13 NOTE — Progress Notes (Unsigned)
Established Patient Office Visit  Subjective:  Patient ID: Timothy Warren, male    DOB: April 15, 1961  Age: 62 y.o. MRN: [id number]  CC:  Chief Complaint  Patient presents with   Hypertension    HPI 09/2021 [name] presents for hypertension and type 2 diabetes.  Note on arrival blood pressure still quite elevated and he is on amlodipine 5 mg daily hydrochlorothiazide 25 mg daily.  Note he has been eating quite a bit of salt in the diet.  His A1c is 6.6 and he is compliant with his metformin.  He is also on the atorvastatin daily.  He brings in his ophthalmology exam result and he had 20/20 vision and no evidence of retinopathy seen.  Patient does have a colonoscopy that is going to be completed soon.  The patient did receive his flu vaccine   Patient is not been able to pay for his diabetic testing supplies his insurance plan will not cover it also he has had difficulty with co-pays on the Viagra.He is planning on getting a new insurance plan in the near and also will get vision coverage  4/24 Patient returns in short-term follow-up on arrival blood pressure elevated 157/80 blood sugar 171 A1c however is good at 6.3  Patient did have a colonoscopy in March of this year does not have to have repeat in 7 years I found 1 small polyp on the patient.  Patient states he has been under some degree of stress has not been eating very healthy at times.  He is taking Viagra for erectile dysfunction and this is helped him Patient has no other complaints  6/26 Patient seen in return follow-up overall doing well decreasing his content of sweets in terms of intake.  On arrival blood sugar is 108 blood pressure 120/75.  He has no other complaints he does need to have his renal panel rechecked.  He has been following closely with clinical pharmacy.  9/26 Patient seen in return follow-up and doing well he was seen in August for gout flare of the left foot and was given colchicine if needed he is  not taking it now.  Uric acid was elevated.  A1c was 6.2 during last visit.  Blood pressure on arrival 116/74.   12/10/22 This patient was seen in return follow-up he has been doing very well his A1c is well-controlled less than 7 blood pressure is excellent at 130/68.  He could not afford Trulicity he has been controlling his diabetes with metformin alone.  He does need an eye exam.  There are no other complaints except for some swelling at the base of the left thumb  8/9 Patient seen in return follow-up for hypertension and diabetes on arrival blood sugar is good diabetic control has been improved blood pressure is 117/78.  Patient maintains an active lifestyle and improved dietary compliance.  He has no complaints at this visit.  Diabetes controlled with met Sherron Monday alone.  He is doing eye exam he will work on getting this done in this calendar year he needs a urine sample for albumin and A1c rechecked   Past Medical History:  Diagnosis Date   Allergy    Chronic kidney disease    stage 2 CKD   Diabetes mellitus without complication (HCC)    Hyperlipidemia    Hypertension    Seasonal allergies     Past Surgical History:  Procedure Laterality Date   NO PAST SURGERIES      Family History  Problem Relation Age of Onset   Hypertension Mother    Hypertension Father    Colon cancer Neg Hx    Colon polyps Neg Hx    Esophageal cancer Neg Hx    Rectal cancer Neg Hx    Stomach cancer Neg Hx     Social History   Socioeconomic History   Marital status: Significant Other    Spouse name: Not on file   Number of children: Not on file   Years of education: Not on file   Highest education level: Not on file  Occupational History   Not on file  Tobacco Use   Smoking status: Never   Smokeless tobacco: Never  Vaping Use   Vaping status: Never Used  Substance and Sexual Activity   Alcohol use: Not Currently    Alcohol/week: 1.0 standard drink of alcohol    Types: 1 Cans of beer per  week    Comment: SVERAL TIMES AWEEK.   Drug use: Yes    Types: Marijuana    Comment: Daily   Sexual activity: Yes  Other Topics Concern   Not on file  Social History Narrative   Not on file   Social Determinants of Health   Financial Resource Strain: Not on file  Food Insecurity: Not on file  Transportation Needs: Not on file  Physical Activity: Not on file  Stress: Not on file  Social Connections: Unknown (03/19/2022)   Received from [facility]   Social Network    Social Network: Not on file  Intimate Partner Violence: Unknown (02/08/2022)   Received from [facility]   HITS    Physically Hurt: Not on file    Insult or Talk Down To: Not on file    Threaten Physical Harm: Not on file    Scream or Curse: Not on file    Outpatient Medications Prior to Visit  Medication Sig Dispense Refill   Blood Glucose Monitoring Suppl (ONETOUCH VERIO) w/Device KIT Use to check blood sugar twice a day 1 kit 0   amLODipine (NORVASC) 10 MG tablet Take 1 tablet (10 mg total) by mouth daily. To lower blood pressure 90 tablet 2   atorvastatin (LIPITOR) 20 MG tablet Take 1 tablet (20 mg total) by mouth daily. 90 tablet 3   glucose blood (ONETOUCH VERIO) test strip Use to check blood sugar twice daily. 100 each 2   hydrochlorothiazide (HYDRODIURIL) 25 MG tablet Take 1 tablet (25 mg total) by mouth daily. 90 tablet 3   metFORMIN (GLUCOPHAGE) 500 MG tablet Take 1 tablet (500 mg total) by mouth 2 (two) times daily with a meal. 180 tablet 3   OneTouch Delica Lancets 30G MISC Check blood sugar twice a day 100 each 2   sildenafil (VIAGRA) 50 MG tablet TAKE 1 TABLET (50 MG TOTAL) BY MOUTH DAILY AS NEEDED FOR ERECTILE DYSFUNCTION. 10 tablet 2   No facility-administered medications prior to visit.    No Known Allergies  ROS Review of Systems  Constitutional:  Negative for chills, diaphoresis and fever.  HENT:  Negative for congestion, hearing loss, nosebleeds, sore throat and tinnitus.   Eyes:   Negative for photophobia and redness.  Respiratory:  Negative for cough, shortness of breath, wheezing and stridor.   Cardiovascular:  Negative for chest pain, palpitations and leg swelling.  Gastrointestinal:  Negative for abdominal pain, blood in stool, constipation, diarrhea, nausea and vomiting.  Endocrine: Negative for polydipsia.  Genitourinary:  Negative for dysuria, flank pain, frequency, hematuria and urgency.  Musculoskeletal:  Negative for back pain, myalgias and neck pain.       Swelling left thumb nontender  Skin:  Negative for rash.  Allergic/Immunologic: Negative for environmental allergies.  Neurological:  Negative for dizziness, tremors, seizures, weakness and headaches.  Hematological:  Does not bruise/bleed easily.  Psychiatric/Behavioral:  Negative for suicidal ideas. The patient is not nervous/anxious.       Objective:    Physical Exam Vitals reviewed.  Constitutional:      Appearance: Normal appearance. He is well-developed. He is not diaphoretic.  HENT:     Head: Normocephalic and atraumatic.     Nose: Nose normal. No nasal deformity, septal deviation, mucosal edema or rhinorrhea.     Right Sinus: No maxillary sinus tenderness or frontal sinus tenderness.     Left Sinus: No maxillary sinus tenderness or frontal sinus tenderness.     Mouth/Throat:     Mouth: Mucous membranes are moist.     Pharynx: Oropharynx is clear. No oropharyngeal exudate.  Eyes:     General: No scleral icterus.    Conjunctiva/sclera: Conjunctivae normal.     Pupils: Pupils are equal, round, and reactive to light.  Neck:     Thyroid: No thyromegaly.     Vascular: No carotid bruit or JVD.     Trachea: Trachea normal. No tracheal tenderness or tracheal deviation.  Cardiovascular:     Rate and Rhythm: Normal rate and regular rhythm.     Chest Wall: PMI is not displaced.     Pulses: Normal pulses. No decreased pulses.     Heart sounds: Normal heart sounds, S1 normal and S2 normal.  Heart sounds not distant. No murmur heard.    No systolic murmur is present.     No diastolic murmur is present.     No friction rub. No gallop. No S3 or S4 sounds.  Pulmonary:     Effort: Pulmonary effort is normal. No tachypnea, accessory muscle usage or respiratory distress.     Breath sounds: Normal breath sounds. No stridor. No decreased breath sounds, wheezing, rhonchi or rales.  Chest:     Chest wall: No tenderness.  Abdominal:     General: Bowel sounds are normal. There is no distension.     Palpations: Abdomen is soft. Abdomen is not rigid.     Tenderness: There is no abdominal tenderness. There is no guarding or rebound.  Musculoskeletal:        General: Swelling present. Normal range of motion.     Cervical back: Normal range of motion and neck supple. No edema, erythema or rigidity. No muscular tenderness. Normal range of motion.     Comments: Mild protuberance at the base of the left thumb likely from prior injury will monitor  Lymphadenopathy:     Head:     Right side of head: No submental or submandibular adenopathy.     Left side of head: No submental or submandibular adenopathy.     Cervical: No cervical adenopathy.  Skin:    General: Skin is warm and dry.     Coloration: Skin is not pale.     Findings: No rash.     Nails: There is no clubbing.  Neurological:     Mental Status: He is alert and oriented to person, place, and time.     Sensory: No sensory deficit.  Psychiatric:        Mood and Affect: Mood normal.        Speech: Speech normal.  Behavior: Behavior normal.        Thought Content: Thought content normal.        Judgment: Judgment normal.     BP 117/78   Pulse 62   Temp 98.6 F (37 C)   Ht 5\' 9"  (1.753 m)   Wt 159 lb (72.1 kg)   SpO2 98%   BMI 23.48 kg/m  Wt Readings from Last 3 Encounters:  06/13/23 159 lb (72.1 kg)  12/10/22 158 lb 9.6 oz (71.9 kg)  07/30/22 157 lb 9.6 oz (71.5 kg)     Health Maintenance Due  Topic Date Due    Zoster Vaccines- Shingrix (2 of 2) 04/23/2022   COVID-19 Vaccine (3 - 2023-24 season) 07/05/2022   OPHTHALMOLOGY EXAM  08/16/2022   Diabetic kidney evaluation - Urine ACR  02/26/2023   INFLUENZA VACCINE  06/05/2023   HEMOGLOBIN A1C  06/10/2023    There are no preventive care reminders to display for this patient.  No results found for: "TSH" Lab Results  Component Value Date   WBC 8.6 06/13/2022   HGB 12.7 (L) 06/13/2022   HCT 38.5 06/13/2022   MCV 83 06/13/2022   PLT 268 06/13/2022   Lab Results  Component Value Date   NA 140 12/10/2022   K 3.8 12/10/2022   CO2 25 12/10/2022   GLUCOSE 99 12/10/2022   BUN 30 (H) 12/10/2022   CREATININE 1.74 (H) 12/10/2022   BILITOT 0.3 03/26/2022   ALKPHOS 60 03/26/2022   AST 27 03/26/2022   ALT 20 03/26/2022   PROT 6.9 03/26/2022   ALBUMIN 4.5 04/29/2022   CALCIUM 9.9 12/10/2022   ANIONGAP 9 01/17/2020   EGFR 44 (L) 12/10/2022   Lab Results  Component Value Date   CHOL 145 02/25/2022   Lab Results  Component Value Date   HDL 57 02/25/2022   Lab Results  Component Value Date   LDLCALC 44 02/25/2022   Lab Results  Component Value Date   TRIG 293 (H) 02/25/2022   Lab Results  Component Value Date   CHOLHDL 2.5 02/25/2022   Lab Results  Component Value Date   HGBA1C 6.3 (A) 12/10/2022      Assessment & Plan:   Problem List Items Addressed This Visit       Cardiovascular and Mediastinum   Hypertension    Hypertension well-controlled no change in medications check labs      Relevant Medications   amLODipine (NORVASC) 10 MG tablet   atorvastatin (LIPITOR) 20 MG tablet   hydrochlorothiazide (HYDRODIURIL) 25 MG tablet   sildenafil (VIAGRA) 100 MG tablet     Endocrine   Type 2 diabetes mellitus without complication, without long-term current use of insulin (HCC) - Primary    Check A1c continue with metformin alone      Relevant Medications   atorvastatin (LIPITOR) 20 MG tablet   metFORMIN (GLUCOPHAGE)  500 MG tablet   Other Relevant Orders   Urine microalbumin-creatinine with uACR   Comprehensive metabolic panel with eGFR (no eGFR if sent to [facility])   HgB A1c   Erectile dysfunction associated with type 2 diabetes mellitus (HCC)    Referral order urology made      Relevant Medications   atorvastatin (LIPITOR) 20 MG tablet   metFORMIN (GLUCOPHAGE) 500 MG tablet   Hyperlipidemia due to type 2 diabetes mellitus (HCC)   Relevant Medications   amLODipine (NORVASC) 10 MG tablet   atorvastatin (LIPITOR) 20 MG tablet   hydrochlorothiazide (HYDRODIURIL) 25 MG tablet  metFORMIN (GLUCOPHAGE) 500 MG tablet   sildenafil (VIAGRA) 100 MG tablet   Other Relevant Orders   Lipid panel     Genitourinary   Stage 3a chronic kidney disease (HCC)    Monitor renal function      Other Visit Diagnoses     Essential hypertension       Relevant Medications   amLODipine (NORVASC) 10 MG tablet   atorvastatin (LIPITOR) 20 MG tablet   hydrochlorothiazide (HYDRODIURIL) 25 MG tablet   sildenafil (VIAGRA) 100 MG tablet   Other Relevant Orders   CBC with Differential/Platelet   Chronic kidney disease, unspecified CKD stage       Relevant Medications   amLODipine (NORVASC) 10 MG tablet   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    Dispense:  100 each    Refill:  2   DISCONTD: metFORMIN (GLUCOPHAGE) 500 MG tablet    Sig: Take 1 tablet (500 mg total) by mouth 2 (two) times daily with a meal.    Dispense:  180 tablet    Refill:  3   DISCONTD: hydrochlorothiazide (HYDRODIURIL) 25 MG tablet    Sig: Take 1 tablet (25 mg total) by mouth daily.    Dispense:  90 tablet     Refill:  3   DISCONTD: atorvastatin (LIPITOR) 20 MG tablet    Sig: Take 1 tablet (20 mg total) by mouth daily.    Dispense:  90 tablet    Refill:  3   DISCONTD: amLODipine (NORVASC) 10 MG tablet    Sig: Take 1 tablet (10 mg total) by mouth daily. To lower blood pressure    Dispense:  90 tablet    Refill:  2   DISCONTD: OneTouch Delica Lancets 30G MISC    Sig: Check blood sugar twice a day    Dispense:  100 each    Refill:  2   DISCONTD: sildenafil (VIAGRA) 100 MG tablet    Sig: Take 1 tablet (100 mg total) by mouth daily as needed for erectile dysfunction.    Dispense:  20 tablet    Refill:  2   amLODipine (NORVASC) 10 MG tablet    Sig: Take 1 tablet (10 mg total) by mouth daily. To lower blood pressure    Dispense:  90 tablet    Refill:  2   atorvastatin (LIPITOR) 20 MG tablet    Sig: Take 1 tablet (20 mg total) by mouth daily.    Dispense:  90 tablet    Refill:  3   glucose blood (ONETOUCH VERIO) test strip    Sig: Use to check blood sugar twice daily.    Dispense:  100 each    Refill:  2   hydrochlorothiazide (HYDRODIURIL) 25 MG tablet    Sig: Take 1 tablet (25 mg total) by mouth daily.    Dispense:  90 tablet    Refill:  3   metFORMIN (GLUCOPHAGE) 500 MG tablet    Sig: Take 1 tablet (500 mg total) by mouth 2 (two) times daily with a meal.    Dispense:  180 tablet  Refill:  3   sildenafil (VIAGRA) 100 MG tablet    Sig: Take 1 tablet (100 mg total) by mouth daily as needed for erectile dysfunction.    Dispense:  20 tablet    Refill:  2   OneTouch Delica Lancets 33G MISC    Sig: Check blood sugar twice a day    Dispense:  100 each    Refill:  3  Follow-up: Return in about 5 months (around 11/13/2023) for diabetes, htn, primary care follow up.    Shan Levans, MD

## 2023-06-14 ENCOUNTER — Encounter: Payer: Self-pay | Admitting: Critical Care Medicine

## 2023-06-14 LAB — CBC WITH DIFFERENTIAL/PLATELET
Basophils Absolute: 0.1 x10E3/uL (ref 0.0–0.2)
Basos: 1 %
EOS (ABSOLUTE): 0.5 x10E3/uL — ABNORMAL HIGH (ref 0.0–0.4)
Eos: 7 %
Hematocrit: 36.8 % — ABNORMAL LOW (ref 37.5–51.0)
Hemoglobin: 12 g/dL — ABNORMAL LOW (ref 13.0–17.7)
Immature Grans (Abs): 0 x10E3/uL (ref 0.0–0.1)
Immature Granulocytes: 0 %
Lymphocytes Absolute: 1.8 x10E3/uL (ref 0.7–3.1)
Lymphs: 30 %
MCH: 27.6 pg (ref 26.6–33.0)
MCHC: 32.6 g/dL (ref 31.5–35.7)
MCV: 85 fL (ref 79–97)
Monocytes Absolute: 0.6 x10E3/uL (ref 0.1–0.9)
Monocytes: 9 %
Neutrophils Absolute: 3.3 x10E3/uL (ref 1.4–7.0)
Neutrophils: 53 %
Platelets: 253 x10E3/uL (ref 150–450)
RBC: 4.35 x10E6/uL (ref 4.14–5.80)
RDW: 13.6 % (ref 11.6–15.4)
WBC: 6.2 x10E3/uL (ref 3.4–10.8)

## 2023-06-14 NOTE — Assessment & Plan Note (Signed)
Check A1c continue with metformin alone

## 2023-06-14 NOTE — Assessment & Plan Note (Signed)
Monitor renal function 

## 2023-06-14 NOTE — Assessment & Plan Note (Signed)
Referral order urology made

## 2023-06-14 NOTE — Assessment & Plan Note (Signed)
Hypertension well-controlled no change in medications check labs

## 2023-06-16 ENCOUNTER — Telehealth: Payer: Self-pay

## 2023-06-16 ENCOUNTER — Other Ambulatory Visit: Payer: Self-pay

## 2023-06-16 ENCOUNTER — Encounter: Payer: Self-pay | Admitting: Orthopedic Surgery

## 2023-06-16 ENCOUNTER — Ambulatory Visit (INDEPENDENT_AMBULATORY_CARE_PROVIDER_SITE_OTHER): Payer: BC Managed Care – PPO | Admitting: Orthopedic Surgery

## 2023-06-16 DIAGNOSIS — M19012 Primary osteoarthritis, left shoulder: Secondary | ICD-10-CM | POA: Diagnosis not present

## 2023-06-16 NOTE — Telephone Encounter (Signed)
Pls proceed with CT left shoulder that was ordered previously.

## 2023-06-16 NOTE — Progress Notes (Signed)
Let pt know kidney stable, blood count normal, cholesterol at goal, no protein in urine

## 2023-06-16 NOTE — Progress Notes (Unsigned)
Office Visit Note   Patient: Timothy Warren           Date of Birth: 05/12/1961           MRN: [id number] Visit Date: 06/16/2023 Requested by: Storm Frisk, MD [address] [place],  [place] [address] PCP: Storm Frisk, MD  Subjective: Chief Complaint  Patient presents with   Left Shoulder - Pain    HPI: Timothy Warren is a 62 y.o. male who presents to the office reporting left shoulder pain.  Patient has seen [name] who states that he has likely both cervical radiculopathy and shoulder arthritis.  Patient would like to proceed with shoulder surgery first.  He does have people at home with him for support network.  Diabetes is well-controlled.  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.  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  Describes night pain and rest pain in the left shoulder..                ROS: All systems reviewed are negative as they relate to the chief complaint within the history of present illness.  Patient denies fevers or chills.  Assessment & Plan: Visit Diagnoses:  1. Primary osteoarthritis, left shoulder     Plan: Impression is left shoulder arthritis.  Does have reasonable external rotation.  Forward flexion is limited.  No muscle atrophy on prior CT scan.  Plan is think that CT scan for preop patient specific instrumentation.  Plan either anatomic shoulder replacement versus reverse shoulder replacement.  Really depends on the status of the rotator cuff as well as the amount of deformity present.  The risk and benefits of surgical intervention are discussed including not limited to infection nerve or vessel damage instability as well as potential longevity issues with the prosthesis.  Patient understands the risk and benefits and wishes to proceed.  All questions answered.  Follow-Up Instructions: No follow-ups on file.   Orders:  No orders of the defined types were placed in this  encounter.  No orders of the defined types were placed in this encounter.     Procedures: No procedures performed   Clinical Data: No additional findings.  Objective: Vital Signs: There were no vitals taken for this visit.  Physical Exam:  Constitutional: Patient appears well-developed HEENT:  Head: Normocephalic Eyes:EOM are normal Neck: Normal range of motion Cardiovascular: Normal rate Pulmonary/chest: Effort normal Neurologic: Patient is alert Skin: Skin is warm Psychiatric: Patient has normal mood and affect  Ortho Exam: Ortho exam demonstrates range of motion on the left of 45/95/110.  Rotator cuff strength is pretty reasonable to infraspinatus supraspinatus and subscap muscle testing.  Motor or sensory function in the hand is intact with 5 out of 5 grip EPL FPL interosseous wrist flexion extension biceps triceps and deltoid strength.  No definite paresthesias today in either arm C5-T1.  Specialty Comments:  MRI CERVICAL SPINE WITHOUT CONTRAST   TECHNIQUE: Multiplanar, multisequence MR imaging of the cervical spine was performed. No intravenous contrast was administered.   COMPARISON:  None Available.   FINDINGS: Alignment: Physiologic.   Vertebrae: Multilevel chronic height loss, greatest at C3-C5. No acute fracture. No discitis-osteomyelitis.   Cord: There is diffuse volume loss of the spinal cord with multifocal hyperintense T2-weighted signal consistent with myelomalacia.   Posterior Fossa, vertebral arteries, paraspinal tissues: Negative.   Disc levels:   C1-2: Unremarkable.   C2-3: Small disc osteophyte  complex, right asymmetric. There is no spinal canal stenosis. Moderate right neural foraminal stenosis.   C3-4: Left asymmetric disc bulge with uncovertebral osteophytes. There is no spinal canal stenosis. Severe left neural foraminal stenosis.   C4-5: Small disc bulge with uncovertebral osteophytes. There is no spinal canal stenosis. Moderate  right and severe left neural foraminal stenosis.   C5-6: Intermediate sized left foraminal disc osteophyte complex and right uncovertebral hypertrophy. Mild spinal canal stenosis. Severe left neural foraminal stenosis.   C6-7: Normal disc space and facet joints. There is no spinal canal stenosis. No neural foraminal stenosis.   C7-T1: Disc space narrowing. There is no spinal canal stenosis. No neural foraminal stenosis.   IMPRESSION: 1. Diffuse volume loss of the spinal cord with multifocal hyperintense T2-weighted signal consistent with myelomalacia. 2. Severe left C3-4, C4-5 and C5-6 neural foraminal stenosis. 3. Moderate right C2-3 and C4-5 neural foraminal stenosis.     Electronically Signed   By: Deatra Robinson M.D.   On: 04/27/2023 23:59  Imaging: No results found.   PMFS History: Patient Active Problem List   Diagnosis Date Noted   Onychomycosis 07/30/2022   Chronic gout without tophus 07/30/2022   Primary osteoarthritis, left shoulder 11/13/2021   Chronic right shoulder pain 10/16/2021   Hyperlipidemia due to type 2 diabetes mellitus (HCC) 05/28/2021   Type 2 diabetes mellitus without complication, without long-term current use of insulin (HCC) 08/12/2020   Stage 3a chronic kidney disease (HCC) 08/12/2020   Erectile dysfunction associated with type 2 diabetes mellitus (HCC) 08/12/2020   Hypertension 02/17/2020   Past Medical History:  Diagnosis Date   Allergy    Chronic kidney disease    stage 2 CKD   Diabetes mellitus without complication (HCC)    Hyperlipidemia    Hypertension    Seasonal allergies     Family History  Problem Relation Age of Onset   Hypertension Mother    Hypertension Father    Colon cancer Neg Hx    Colon polyps Neg Hx    Esophageal cancer Neg Hx    Rectal cancer Neg Hx    Stomach cancer Neg Hx     Past Surgical History:  Procedure Laterality Date   NO PAST SURGERIES     Social History   Occupational History   Not on file   Tobacco Use   Smoking status: Never   Smokeless tobacco: Never  Vaping Use   Vaping status: Never Used  Substance and Sexual Activity   Alcohol use: Not Currently    Alcohol/week: 1.0 standard drink of alcohol    Types: 1 Cans of beer per week    Comment: SVERAL TIMES AWEEK.   Drug use: Yes    Types: Marijuana    Comment: Daily   Sexual activity: Yes

## 2023-06-17 ENCOUNTER — Telehealth: Payer: Self-pay

## 2023-06-17 NOTE — Telephone Encounter (Signed)
Pt was called and is aware of results, DOB was confirmed.  ?

## 2023-06-17 NOTE — Telephone Encounter (Signed)
Got authorization from insurance and faxed order to [facility]

## 2023-06-17 NOTE — Telephone Encounter (Signed)
-----   Message from Shan Levans sent at 06/16/2023  6:39 AM EDT ----- Let pt know kidney stable, blood count normal, cholesterol at goal, no protein in urine

## 2023-06-18 ENCOUNTER — Other Ambulatory Visit: Payer: Self-pay

## 2023-06-30 ENCOUNTER — Telehealth: Payer: Self-pay | Admitting: Orthopedic Surgery

## 2023-06-30 NOTE — Telephone Encounter (Signed)
Patient came by advised his MRI is scheduled for 07/08/2023 at 4:30 pm. Patient asked for a call back to get  surgery date so that he can start his disability paperwork. The number to contact patient is [phone]

## 2023-07-08 ENCOUNTER — Ambulatory Visit
Admission: RE | Admit: 2023-07-08 | Discharge: 2023-07-08 | Disposition: A | Discharge status: Home / Self Care | Payer: BC Managed Care – PPO | Source: Ambulatory Visit | Attending: Surgical | Admitting: Surgical

## 2023-07-08 DIAGNOSIS — M19012 Primary osteoarthritis, left shoulder: Secondary | ICD-10-CM | POA: Diagnosis not present

## 2023-07-10 ENCOUNTER — Telehealth: Payer: Self-pay | Admitting: Orthopedic Surgery

## 2023-07-10 NOTE — Telephone Encounter (Signed)
[employer] forms received. To [employer]. 

## 2023-07-14 ENCOUNTER — Telehealth: Payer: Self-pay | Admitting: Orthopedic Surgery

## 2023-07-14 NOTE — Telephone Encounter (Signed)
Pt came in office and signed medical release form and pains $25.00 cash to [employer] for short term disability on 07/14/23.

## 2023-07-15 ENCOUNTER — Other Ambulatory Visit: Payer: Self-pay

## 2023-07-30 NOTE — Pre-Procedure Instructions (Signed)
Surgical Instructions   Your procedure is scheduled on Tuesday, October 8th. Report to [facility] Main Entrance "A" at 09:35 A.M., then check in with the Admitting office. Any questions or running late day of surgery: call [phone]  Questions prior to your surgery date: call [phone], Monday-Friday, 8am-4pm. If you experience any cold or flu symptoms such as cough, fever, chills, shortness of breath, etc. between now and your scheduled surgery, please notify us at the above number.     Remember:  Do not eat after midnight the night before your surgery  You may drink clear liquids until 08:35 AM the morning of your surgery.   Clear liquids allowed are: Water, Non-Citrus Juices (without pulp), Carbonated Beverages, Clear Tea, Black Coffee Only (NO MILK, CREAM OR POWDERED CREAMER of any kind), and Gatorade.  Patient Instructions  The night before surgery:  No food after midnight. ONLY clear liquids after midnight   The day of surgery (if you have diabetes): Drink ONE (1) 12 oz G2 given to you in your pre admission testing appointment by 08:35 AM the morning of surgery. Drink in one sitting. Do not sip.  This drink was given to you during your hospital  pre-op appointment visit.  Nothing else to drink after completing the  12 oz bottle of G2.         If you have questions, please contact your surgeon's office.    Take these medicines the morning of surgery with A SIP OF WATER  amLODipine (NORVASC)  atorvastatin (LIPITOR)   May take these medicines IF NEEDED: acetaminophen (TYLENOL)    One week prior to surgery, STOP taking any Aspirin (unless otherwise instructed by your surgeon) Aleve, Naproxen, Ibuprofen, Motrin, Advil, Goody's, BC's, all herbal medications, fish oil, and non-prescription vitamins.  WHAT DO I DO ABOUT MY DIABETES MEDICATION?   Do not take metFORMIN (GLUCOPHAGE) the morning of surgery.    HOW TO MANAGE YOUR DIABETES BEFORE AND AFTER  SURGERY  Why is it important to control my blood sugar before and after surgery? Improving blood sugar levels before and after surgery helps healing and can limit problems. A way of improving blood sugar control is eating a healthy diet by:  Eating less sugar and carbohydrates  Increasing activity/exercise  Talking with your doctor about reaching your blood sugar goals High blood sugars (greater than 180 mg/dL) can raise your risk of infections and slow your recovery, so you will need to focus on controlling your diabetes during the weeks before surgery. Make sure that the doctor who takes care of your diabetes knows about your planned surgery including the date and location.  How do I manage my blood sugar before surgery? Check your blood sugar at least 4 times a day, starting 2 days before surgery, to make sure that the level is not too high or low.  Check your blood sugar the morning of your surgery when you wake up and every 2 hours until you get to the Short Stay unit.  If your blood sugar is less than 70 mg/dL, you will need to treat for low blood sugar: Do not take insulin. Treat a low blood sugar (less than 70 mg/dL) with  cup of clear juice (cranberry or apple), 4 glucose tablets, OR glucose gel. Recheck blood sugar in 15 minutes after treatment (to make sure it is greater than 70 mg/dL). If your blood sugar is not greater than 70 mg/dL on recheck, call [phone] for further instructions. Report your blood sugar  to the short stay nurse when you get to Short Stay.  If you are admitted to the hospital after surgery: Your blood sugar will be checked by the staff and you will probably be given insulin after surgery (instead of oral diabetes medicines) to make sure you have good blood sugar levels. The goal for blood sugar control after surgery is 80-180 mg/dL.                     Do NOT Smoke (Tobacco/Vaping) for 24 hours prior to your procedure.  If you use a CPAP at night, you  may bring your mask/headgear for your overnight stay.   You will be asked to remove any contacts, glasses, piercing's, hearing aid's, dentures/partials prior to surgery. Please bring cases for these items if needed.    Patients discharged the day of surgery will not be allowed to drive home, and someone needs to stay with them for 24 hours.  SURGICAL WAITING ROOM VISITATION Patients may have no more than 2 support people in the waiting area - these visitors may rotate.   Pre-op nurse will coordinate an appropriate time for 1 ADULT support person, who may not rotate, to accompany patient in pre-op.  Children under the age of 36 must have an adult with them who is not the patient and must remain in the main waiting area with an adult.  If the patient needs to stay at the hospital during part of their recovery, the visitor guidelines for inpatient rooms apply.  Please refer to the [facility] website for the visitor guidelines for any additional information.   If you received a COVID test during your pre-op visit  it is requested that you wear a mask when out in public, stay away from anyone that may not be feeling well and notify your surgeon if you develop symptoms. If you have been in contact with anyone that has tested positive in the last 10 days please notify you surgeon.      Pre-operative 5 CHG Bathing Instructions   You can play a key role in reducing the risk of infection after surgery. Your skin needs to be as free of germs as possible. You can reduce the number of germs on your skin by washing with CHG (chlorhexidine gluconate) soap before surgery. CHG is an antiseptic soap that kills germs and continues to kill germs even after washing.   DO NOT use if you have an allergy to chlorhexidine/CHG or antibacterial soaps. If your skin becomes reddened or irritated, stop using the CHG and notify one of our RNs at [phone].   Please shower with the CHG soap starting 4 days before  surgery using the following schedule:     Please keep in mind the following:  DO NOT shave, including legs and underarms, starting the day of your first shower.   You may shave your face at any point before/day of surgery.  Place clean sheets on your bed the day you start using CHG soap. Use a clean washcloth (not used since being washed) for each shower. DO NOT sleep with pets once you start using the CHG.   CHG Shower Instructions:  Wash your face and private area with normal soap. If you choose to wash your hair, wash first with your normal shampoo.  After you use shampoo/soap, rinse your hair and body thoroughly to remove shampoo/soap residue.  Turn the water OFF and apply about 3 tablespoons (45 ml) of CHG soap to  a Animal nutritionist.  Apply CHG soap ONLY FROM YOUR NECK DOWN TO YOUR TOES (washing for 3-5 minutes)  DO NOT use CHG soap on face, private areas, open wounds, or sores.  Pay special attention to the area where your surgery is being performed.  If you are having back surgery, having someone wash your back for you may be helpful. Wait 2 minutes after CHG soap is applied, then you may rinse off the CHG soap.  Pat dry with a clean towel  Put on clean clothes/pajamas   If you choose to wear lotion, please use ONLY the CHG-compatible lotions on the back of this paper.   Additional instructions for the day of surgery: DO NOT APPLY any lotions, deodorants, cologne, or perfumes.   Do not bring valuables to the hospital. [facility] is not responsible for any belongings/valuables. Do not wear nail polish, gel polish, artificial nails, or any other type of covering on natural nails (fingers and toes) Do not wear jewelry or makeup Put on clean/comfortable clothes.  Please brush your teeth.  Ask your nurse before applying any prescription medications to the skin.     CHG Compatible Lotions   Aveeno Moisturizing lotion  Cetaphil Moisturizing Cream  Cetaphil Moisturizing  Lotion  Clairol Herbal Essence Moisturizing Lotion, Dry Skin  Clairol Herbal Essence Moisturizing Lotion, Extra Dry Skin  Clairol Herbal Essence Moisturizing Lotion, Normal Skin  Curel Age Defying Therapeutic Moisturizing Lotion with Alpha Hydroxy  Curel Extreme Care Body Lotion  Curel Soothing Hands Moisturizing Hand Lotion  Curel Therapeutic Moisturizing Cream, Fragrance-Free  Curel Therapeutic Moisturizing Lotion, Fragrance-Free  Curel Therapeutic Moisturizing Lotion, Original Formula  Eucerin Daily Replenishing Lotion  Eucerin Dry Skin Therapy Plus Alpha Hydroxy Crme  Eucerin Dry Skin Therapy Plus Alpha Hydroxy Lotion  Eucerin Original Crme  Eucerin Original Lotion  Eucerin Plus Crme Eucerin Plus Lotion  Eucerin TriLipid Replenishing Lotion  Keri Anti-Bacterial Hand Lotion  Keri Deep Conditioning Original Lotion Dry Skin Formula Softly Scented  Keri Deep Conditioning Original Lotion, Fragrance Free Sensitive Skin Formula  Keri Lotion Fast Absorbing Fragrance Free Sensitive Skin Formula  Keri Lotion Fast Absorbing Softly Scented Dry Skin Formula  Keri Original Lotion  Keri Skin Renewal Lotion Keri Silky Smooth Lotion  Keri Silky Smooth Sensitive Skin Lotion  Nivea Body Creamy Conditioning Oil  Nivea Body Extra Enriched Lotion  Nivea Body Original Lotion  Nivea Body Sheer Moisturizing Lotion Nivea Crme  Nivea Skin Firming Lotion  NutraDerm 30 Skin Lotion  NutraDerm Skin Lotion  NutraDerm Therapeutic Skin Cream  NutraDerm Therapeutic Skin Lotion  ProShield Protective Hand Cream  Provon moisturizing lotion  Please read over the following fact sheets that you were given.    Benzoyl Peroxide Gel  o Reduces the number of germs present on the skin  o Applied twice a day to shoulder area starting two days before surgery  ==================================================================  Please follow these instructions carefully:  BENZOYL PEROXIDE 5% GEL  Please do  not use if you have an allergy to benzoyl peroxide. If your skin becomes reddened/irritated stop using the benzoyl peroxide.  Starting two days before surgery, apply as follows:  1. Apply benzoyl peroxide in the morning and at night. Apply after taking a shower. If you are not taking a shower clean entire shoulder front, back, and side along with the armpit with a clean wet washcloth.  2. Place a quarter-sized dollop on your SHOULDER and rub in thoroughly, making sure to cover the front, back, and side  of your shoulder, along with the armpit.   2 Days prior to Surgery First Dose on _____________ Morning Second Dose on ______________ Night  Day Before Surgery First Dose on ______________ Morning Night before surgery wash (entire body except face and private areas) with CHG Soap THEN Second Dose on ____________ Night   Morning of Surgery  wash BODY AGAIN with CHG Soap   4. Do NOT apply benzoyl peroxide gel on the day of surgery

## 2023-07-31 ENCOUNTER — Other Ambulatory Visit: Payer: Self-pay

## 2023-07-31 ENCOUNTER — Encounter (HOSPITAL_COMMUNITY): Payer: Self-pay

## 2023-07-31 ENCOUNTER — Encounter (HOSPITAL_COMMUNITY)
Admission: RE | Admit: 2023-07-31 | Discharge: 2023-07-31 | Disposition: A | Discharge status: Home / Self Care | Payer: BC Managed Care – PPO | Source: Ambulatory Visit | Attending: Orthopedic Surgery | Admitting: Orthopedic Surgery

## 2023-07-31 VITALS — BP 136/84 | HR 64 | Temp 98.3°F | Resp 17 | Ht 69.0 in | Wt 158.3 lb

## 2023-07-31 DIAGNOSIS — E119 Type 2 diabetes mellitus without complications: Secondary | ICD-10-CM | POA: Insufficient documentation

## 2023-07-31 DIAGNOSIS — R9431 Abnormal electrocardiogram [ECG] [EKG]: Secondary | ICD-10-CM | POA: Insufficient documentation

## 2023-07-31 DIAGNOSIS — Z01812 Encounter for preprocedural laboratory examination: Secondary | ICD-10-CM | POA: Diagnosis not present

## 2023-07-31 DIAGNOSIS — Z01810 Encounter for preprocedural cardiovascular examination: Secondary | ICD-10-CM | POA: Insufficient documentation

## 2023-07-31 DIAGNOSIS — Z01818 Encounter for other preprocedural examination: Secondary | ICD-10-CM

## 2023-07-31 LAB — CBC
HCT: 40 % (ref 39.0–52.0)
Hemoglobin: 13 g/dL (ref 13.0–17.0)
MCH: 27.7 pg (ref 26.0–34.0)
MCHC: 32.5 g/dL (ref 30.0–36.0)
MCV: 85.1 fL (ref 80.0–100.0)
Platelets: 256 10*3/uL (ref 150–400)
RBC: 4.7 MIL/uL (ref 4.22–5.81)
RDW: 14.9 % (ref 11.5–15.5)
WBC: 7.9 10*3/uL (ref 4.0–10.5)
nRBC: 0 % (ref 0.0–0.2)

## 2023-07-31 LAB — GLUCOSE, CAPILLARY: Glucose-Capillary: 117 mg/dL — ABNORMAL HIGH (ref 70–99)

## 2023-07-31 LAB — URINALYSIS, W/ REFLEX TO CULTURE (INFECTION SUSPECTED)
Bacteria, UA: NONE SEEN
Bilirubin Urine: NEGATIVE
Glucose, UA: NEGATIVE mg/dL
Ketones, ur: NEGATIVE mg/dL
Leukocytes,Ua: NEGATIVE
Nitrite: NEGATIVE
Protein, ur: NEGATIVE mg/dL
Specific Gravity, Urine: 1.017 (ref 1.005–1.030)
pH: 5 (ref 5.0–8.0)

## 2023-07-31 LAB — BASIC METABOLIC PANEL
Anion gap: 15 (ref 5–15)
BUN: 30 mg/dL — ABNORMAL HIGH (ref 8–23)
CO2: 25 mmol/L (ref 22–32)
Calcium: 9.1 mg/dL (ref 8.9–10.3)
Chloride: 95 mmol/L — ABNORMAL LOW (ref 98–111)
Creatinine, Ser: 2.06 mg/dL — ABNORMAL HIGH (ref 0.61–1.24)
GFR, Estimated: 36 mL/min — ABNORMAL LOW (ref >60)
Glucose, Bld: 166 mg/dL — ABNORMAL HIGH (ref 70–99)
Potassium: 3.3 mmol/L — ABNORMAL LOW (ref 3.5–5.1)
Sodium: 135 mmol/L (ref 135–145)

## 2023-07-31 LAB — SURGICAL PCR SCREEN
MRSA, PCR: NEGATIVE
Staphylococcus aureus: POSITIVE — AB

## 2023-07-31 LAB — HEMOGLOBIN A1C
Hgb A1c MFr Bld: 6.6 % — ABNORMAL HIGH (ref 4.8–5.6)
Mean Plasma Glucose: 142.72 mg/dL

## 2023-07-31 NOTE — Progress Notes (Signed)
PCP - Dr. Shan Levans Cardiologist - denies  PPM/ICD - denies   Chest x-ray - 06/19/15 EKG - 07/31/23 Stress Test - denies ECHO - denies Cardiac Cath - denies  Sleep Study - denies   Fasting Blood Sugar - 90-120 Checks Blood Sugar 1-2 times a day  Last dose of GLP1 agonist-  n/a   ASA/Blood Thinner Instructions: n/a   ERAS Protcol - yes PRE-SURGERY G2- given at PAT  COVID TEST- n/a   Anesthesia review: no  Patient denies shortness of breath, fever, cough and chest pain at PAT appointment   All instructions explained to the patient, with a verbal understanding of the material. Patient agrees to go over the instructions while at home for a better understanding.  The opportunity to ask questions was provided.

## 2023-07-31 NOTE — Progress Notes (Signed)
EKG abnormal.  Has he had cardiac risk stratification?

## 2023-08-11 NOTE — Anesthesia Preprocedure Evaluation (Signed)
Anesthesia Evaluation  Patient identified by MRN, date of birth, ID band Patient awake    Reviewed: Allergy & Precautions, NPO status , Patient's Chart, lab work & pertinent test results  History of Anesthesia Complications Negative for: history of anesthetic complications  Airway Mallampati: II  TM Distance: >3 FB Neck ROM: Full    Dental  (+) Missing,    Pulmonary neg pulmonary ROS   Pulmonary exam normal        Cardiovascular hypertension, Pt. on medications Normal cardiovascular exam     Neuro/Psych    GI/Hepatic negative GI ROS, Neg liver ROS,,,  Endo/Other  diabetes, Type 2, Oral Hypoglycemic Agents    Renal/GU Renal InsufficiencyRenal disease     Musculoskeletal  (+) Arthritis ,    Abdominal   Peds  Hematology negative hematology ROS (+)   Anesthesia Other Findings Day of surgery medications reviewed with patient.  Reproductive/Obstetrics                              Anesthesia Physical Anesthesia Plan  ASA: 2  Anesthesia Plan: General   Post-op Pain Management: Regional block* and Tylenol PO (pre-op)*   Induction: Intravenous  PONV Risk Score and Plan: Dexamethasone, Ondansetron, Treatment may vary due to age or medical condition and Midazolam  Airway Management Planned: Oral ETT  Additional Equipment: None  Intra-op Plan:   Post-operative Plan: Extubation in OR  Informed Consent: I have reviewed the patients History and Physical, chart, labs and discussed the procedure including the risks, benefits and alternatives for the proposed anesthesia with the patient or authorized representative who has indicated his/her understanding and acceptance.     Dental advisory given  Plan Discussed with: CRNA  Anesthesia Plan Comments:         Anesthesia Quick Evaluation

## 2023-08-11 NOTE — Progress Notes (Signed)
Patient called in stating he was noticing a red rash on his left shoulder after showering with CHG rinse provided at his PAT appointment. Advised patient to stop using CHG and start using an antibacterial soap such as Dial per protocol. CHG added to allergy list.

## 2023-08-12 ENCOUNTER — Ambulatory Visit (HOSPITAL_COMMUNITY): Payer: Self-pay | Admitting: Anesthesiology

## 2023-08-12 ENCOUNTER — Other Ambulatory Visit: Payer: Self-pay

## 2023-08-12 ENCOUNTER — Encounter (HOSPITAL_COMMUNITY)
Admission: AD | Disposition: A | Discharge status: Home / Self Care | Payer: Self-pay | Source: Ambulatory Visit | Attending: Orthopedic Surgery

## 2023-08-12 ENCOUNTER — Observation Stay (HOSPITAL_COMMUNITY): Payer: BC Managed Care – PPO

## 2023-08-12 ENCOUNTER — Encounter (HOSPITAL_COMMUNITY): Payer: Self-pay | Admitting: Orthopedic Surgery

## 2023-08-12 ENCOUNTER — Observation Stay (HOSPITAL_COMMUNITY)
Admission: AD | Admit: 2023-08-12 | Discharge: 2023-08-13 | Disposition: A | Discharge status: Home / Self Care | Payer: BC Managed Care – PPO | Source: Ambulatory Visit | Attending: Orthopedic Surgery | Admitting: Orthopedic Surgery

## 2023-08-12 DIAGNOSIS — Z7984 Long term (current) use of oral hypoglycemic drugs: Secondary | ICD-10-CM | POA: Insufficient documentation

## 2023-08-12 DIAGNOSIS — Z79899 Other long term (current) drug therapy: Secondary | ICD-10-CM | POA: Diagnosis not present

## 2023-08-12 DIAGNOSIS — Z96612 Presence of left artificial shoulder joint: Secondary | ICD-10-CM | POA: Diagnosis not present

## 2023-08-12 DIAGNOSIS — N182 Chronic kidney disease, stage 2 (mild): Secondary | ICD-10-CM | POA: Insufficient documentation

## 2023-08-12 DIAGNOSIS — Z01818 Encounter for other preprocedural examination: Principal | ICD-10-CM

## 2023-08-12 DIAGNOSIS — M19012 Primary osteoarthritis, left shoulder: Principal | ICD-10-CM | POA: Insufficient documentation

## 2023-08-12 DIAGNOSIS — E119 Type 2 diabetes mellitus without complications: Secondary | ICD-10-CM

## 2023-08-12 DIAGNOSIS — Z471 Aftercare following joint replacement surgery: Secondary | ICD-10-CM | POA: Diagnosis not present

## 2023-08-12 DIAGNOSIS — M7522 Bicipital tendinitis, left shoulder: Secondary | ICD-10-CM | POA: Diagnosis not present

## 2023-08-12 DIAGNOSIS — I129 Hypertensive chronic kidney disease with stage 1 through stage 4 chronic kidney disease, or unspecified chronic kidney disease: Secondary | ICD-10-CM | POA: Diagnosis not present

## 2023-08-12 DIAGNOSIS — G8918 Other acute postprocedural pain: Secondary | ICD-10-CM | POA: Diagnosis not present

## 2023-08-12 DIAGNOSIS — E1122 Type 2 diabetes mellitus with diabetic chronic kidney disease: Secondary | ICD-10-CM | POA: Diagnosis not present

## 2023-08-12 DIAGNOSIS — M19019 Primary osteoarthritis, unspecified shoulder: Secondary | ICD-10-CM

## 2023-08-12 HISTORY — PX: BICEPT TENODESIS: SHX5116

## 2023-08-12 HISTORY — PX: REVERSE SHOULDER ARTHROPLASTY: SHX5054

## 2023-08-12 LAB — GLUCOSE, CAPILLARY
Glucose-Capillary: 169 mg/dL — ABNORMAL HIGH (ref 70–99)
Glucose-Capillary: 109 mg/dL — ABNORMAL HIGH (ref 70–99)
Glucose-Capillary: 204 mg/dL — ABNORMAL HIGH (ref 70–99)

## 2023-08-12 SURGERY — ARTHROPLASTY, SHOULDER, TOTAL, REVERSE
Anesthesia: Regional | Site: Shoulder | Laterality: Left

## 2023-08-12 MED ORDER — DOCUSATE SODIUM 100 MG PO CAPS
100.0000 mg | ORAL_CAPSULE | Freq: Two times a day (BID) | ORAL | Status: DC
  Administered 2023-08-12 – 2023-08-13 (×2): 100 mg via ORAL
  Filled 2023-08-12 (×2): qty 1

## 2023-08-12 MED ORDER — MIDAZOLAM HCL 2 MG/2ML IJ SOLN: 2.0000 mg | Freq: Once | INTRAMUSCULAR | Status: AC

## 2023-08-12 MED ORDER — DEXMEDETOMIDINE HCL IN NACL 80 MCG/20ML IV SOLN
INTRAVENOUS | Status: DC | PRN
Start: 2023-08-12 — End: 2023-08-12
  Administered 2023-08-12 (×2): 8 ug via INTRAVENOUS

## 2023-08-12 MED ORDER — CELECOXIB 100 MG PO CAPS
100.0000 mg | ORAL_CAPSULE | Freq: Every day | ORAL | Status: DC
  Administered 2023-08-12 – 2023-08-13 (×2): 100 mg via ORAL
  Filled 2023-08-12 (×2): qty 1

## 2023-08-12 MED ORDER — MENTHOL 3 MG MT LOZG: 1.0000 | LOZENGE | OROMUCOSAL | Status: DC | PRN

## 2023-08-12 MED ORDER — LIDOCAINE 2% (20 MG/ML) 5 ML SYRINGE
INTRAMUSCULAR | Status: AC
  Filled 2023-08-12: qty 5

## 2023-08-12 MED ORDER — CEFAZOLIN SODIUM-DEXTROSE 2-4 GM/100ML-% IV SOLN
2.0000 g | Freq: Three times a day (TID) | INTRAVENOUS | Status: DC
  Administered 2023-08-12 – 2023-08-13 (×2): 2 g via INTRAVENOUS
  Filled 2023-08-12 (×2): qty 100

## 2023-08-12 MED ORDER — METHOCARBAMOL 1000 MG/10ML IJ SOLN: 500.0000 mg | Freq: Four times a day (QID) | INTRAVENOUS | Status: DC | PRN

## 2023-08-12 MED ORDER — PHENYLEPHRINE HCL-NACL 20-0.9 MG/250ML-% IV SOLN
INTRAVENOUS | Status: DC | PRN
Start: 2023-08-12 — End: 2023-08-12
  Administered 2023-08-12: 40 ug/min via INTRAVENOUS

## 2023-08-12 MED ORDER — POVIDONE-IODINE 10 % EX SWAB
2.0000 | Freq: Once | CUTANEOUS | Status: AC
  Administered 2023-08-12: 2 via TOPICAL

## 2023-08-12 MED ORDER — EPHEDRINE SULFATE (PRESSORS) 50 MG/ML IJ SOLN
INTRAMUSCULAR | Status: DC | PRN
Start: 2023-08-12 — End: 2023-08-12
  Administered 2023-08-12 (×2): 5 mg via INTRAVENOUS

## 2023-08-12 MED ORDER — ONDANSETRON HCL 4 MG/2ML IJ SOLN
INTRAMUSCULAR | Status: AC
  Filled 2023-08-12: qty 2

## 2023-08-12 MED ORDER — FENTANYL CITRATE (PF) 100 MCG/2ML IJ SOLN: 50.0000 ug | Freq: Once | INTRAMUSCULAR | Status: AC

## 2023-08-12 MED ORDER — STERILE WATER FOR IRRIGATION IR SOLN
Status: DC | PRN
Start: 2023-08-12 — End: 2023-08-12
  Administered 2023-08-12: 1000 mL

## 2023-08-12 MED ORDER — FENTANYL CITRATE (PF) 100 MCG/2ML IJ SOLN
INTRAMUSCULAR | Status: AC
  Administered 2023-08-12: 50 ug via INTRAVENOUS
  Filled 2023-08-12: qty 2

## 2023-08-12 MED ORDER — LACTATED RINGERS IV SOLN: INTRAVENOUS | Status: DC

## 2023-08-12 MED ORDER — ATORVASTATIN CALCIUM 10 MG PO TABS
20.0000 mg | ORAL_TABLET | Freq: Every day | ORAL | Status: DC
  Administered 2023-08-13: 20 mg via ORAL
  Filled 2023-08-12 (×2): qty 2

## 2023-08-12 MED ORDER — ROCURONIUM BROMIDE 10 MG/ML (PF) SYRINGE
PREFILLED_SYRINGE | INTRAVENOUS | Status: DC | PRN
  Administered 2023-08-12: 40 mg via INTRAVENOUS

## 2023-08-12 MED ORDER — ONDANSETRON HCL 4 MG/2ML IJ SOLN: 4.0000 mg | Freq: Four times a day (QID) | INTRAMUSCULAR | Status: DC | PRN

## 2023-08-12 MED ORDER — BUPIVACAINE LIPOSOME 1.3 % IJ SUSP
INTRAMUSCULAR | Status: DC | PRN
Start: 2023-08-12 — End: 2023-08-12
  Administered 2023-08-12: 10 mL via PERINEURAL

## 2023-08-12 MED ORDER — ORAL CARE MOUTH RINSE: 15.0000 mL | Freq: Once | OROMUCOSAL | Status: AC

## 2023-08-12 MED ORDER — 0.9 % SODIUM CHLORIDE (POUR BTL) OPTIME
TOPICAL | Status: DC | PRN
  Administered 2023-08-12: 2000 mL
  Administered 2023-08-12: 1000 mL

## 2023-08-12 MED ORDER — CHLORHEXIDINE GLUCONATE 0.12 % MT SOLN
15.0000 mL | Freq: Once | OROMUCOSAL | Status: AC
  Administered 2023-08-12: 15 mL via OROMUCOSAL
  Filled 2023-08-12: qty 15

## 2023-08-12 MED ORDER — ROCURONIUM BROMIDE 10 MG/ML (PF) SYRINGE
PREFILLED_SYRINGE | INTRAVENOUS | Status: AC
  Filled 2023-08-12: qty 10

## 2023-08-12 MED ORDER — TRANEXAMIC ACID-NACL 1000-0.7 MG/100ML-% IV SOLN
1000.0000 mg | INTRAVENOUS | Status: AC
  Administered 2023-08-12: 1000 mg via INTRAVENOUS
  Filled 2023-08-12: qty 100

## 2023-08-12 MED ORDER — METOCLOPRAMIDE HCL 5 MG/ML IJ SOLN: 5.0000 mg | Freq: Three times a day (TID) | INTRAMUSCULAR | Status: DC | PRN

## 2023-08-12 MED ORDER — METHOCARBAMOL 500 MG PO TABS
500.0000 mg | ORAL_TABLET | Freq: Four times a day (QID) | ORAL | Status: DC | PRN
  Administered 2023-08-12: 500 mg via ORAL
  Filled 2023-08-12: qty 1

## 2023-08-12 MED ORDER — SODIUM CHLORIDE 0.9 % IV SOLN: INTRAVENOUS | Status: DC

## 2023-08-12 MED ORDER — INSULIN ASPART 100 UNIT/ML IJ SOLN
0.0000 [IU] | INTRAMUSCULAR | Status: DC | PRN
  Administered 2023-08-12: 2 [IU] via SUBCUTANEOUS

## 2023-08-12 MED ORDER — CEFAZOLIN SODIUM-DEXTROSE 2-4 GM/100ML-% IV SOLN
2.0000 g | INTRAVENOUS | Status: AC
  Administered 2023-08-12: 2 g via INTRAVENOUS
  Filled 2023-08-12: qty 100

## 2023-08-12 MED ORDER — HYDROCHLOROTHIAZIDE 25 MG PO TABS
25.0000 mg | ORAL_TABLET | Freq: Every day | ORAL | Status: DC
  Administered 2023-08-12 – 2023-08-13 (×2): 25 mg via ORAL
  Filled 2023-08-12 (×3): qty 1

## 2023-08-12 MED ORDER — PROPOFOL 10 MG/ML IV BOLUS
INTRAVENOUS | Status: DC | PRN
  Administered 2023-08-12: 150 mg via INTRAVENOUS

## 2023-08-12 MED ORDER — BUPIVACAINE-EPINEPHRINE (PF) 0.5% -1:200000 IJ SOLN
INTRAMUSCULAR | Status: DC | PRN
Start: 2023-08-12 — End: 2023-08-12
  Administered 2023-08-12: 15 mL via PERINEURAL

## 2023-08-12 MED ORDER — DEXAMETHASONE SODIUM PHOSPHATE 10 MG/ML IJ SOLN
INTRAMUSCULAR | Status: AC
  Filled 2023-08-12: qty 1

## 2023-08-12 MED ORDER — ACETAMINOPHEN 500 MG PO TABS
1000.0000 mg | ORAL_TABLET | Freq: Four times a day (QID) | ORAL | Status: DC
  Administered 2023-08-12 – 2023-08-13 (×3): 1000 mg via ORAL
  Filled 2023-08-12 (×3): qty 2

## 2023-08-12 MED ORDER — INSULIN ASPART 100 UNIT/ML IJ SOLN
INTRAMUSCULAR | Status: AC
  Filled 2023-08-12: qty 1

## 2023-08-12 MED ORDER — FENTANYL CITRATE (PF) 250 MCG/5ML IJ SOLN
INTRAMUSCULAR | Status: AC
  Filled 2023-08-12: qty 5

## 2023-08-12 MED ORDER — FENTANYL CITRATE (PF) 250 MCG/5ML IJ SOLN
INTRAMUSCULAR | Status: DC | PRN
  Administered 2023-08-12 (×2): 50 ug via INTRAVENOUS
  Administered 2023-08-12: 100 ug via INTRAVENOUS
  Administered 2023-08-12: 50 ug via INTRAVENOUS

## 2023-08-12 MED ORDER — PHENOL 1.4 % MT LIQD: 1.0000 | OROMUCOSAL | Status: DC | PRN

## 2023-08-12 MED ORDER — ONDANSETRON HCL 4 MG PO TABS: 4.0000 mg | ORAL_TABLET | Freq: Four times a day (QID) | ORAL | Status: DC | PRN

## 2023-08-12 MED ORDER — MIDAZOLAM HCL 2 MG/2ML IJ SOLN
INTRAMUSCULAR | Status: AC
  Administered 2023-08-12: 2 mg via INTRAVENOUS
  Filled 2023-08-12: qty 2

## 2023-08-12 MED ORDER — SUGAMMADEX SODIUM 200 MG/2ML IV SOLN
INTRAVENOUS | Status: DC | PRN
  Administered 2023-08-12 (×2): 100 mg via INTRAVENOUS

## 2023-08-12 MED ORDER — METOCLOPRAMIDE HCL 5 MG PO TABS: 5.0000 mg | ORAL_TABLET | Freq: Three times a day (TID) | ORAL | Status: DC | PRN

## 2023-08-12 MED ORDER — HYDROMORPHONE HCL 1 MG/ML IJ SOLN: 0.5000 mg | INTRAMUSCULAR | Status: DC | PRN

## 2023-08-12 MED ORDER — POVIDONE-IODINE 7.5 % EX SOLN: Freq: Once | CUTANEOUS | Status: DC

## 2023-08-12 MED ORDER — PHENYLEPHRINE HCL (PRESSORS) 10 MG/ML IV SOLN
INTRAVENOUS | Status: DC | PRN
Start: 2023-08-12 — End: 2023-08-12
  Administered 2023-08-12: 160 ug via INTRAVENOUS

## 2023-08-12 MED ORDER — ACETAMINOPHEN 325 MG PO TABS: 325.0000 mg | ORAL_TABLET | Freq: Four times a day (QID) | ORAL | Status: DC | PRN

## 2023-08-12 MED ORDER — LIDOCAINE 2% (20 MG/ML) 5 ML SYRINGE
INTRAMUSCULAR | Status: DC | PRN
  Administered 2023-08-12: 100 mg via INTRAVENOUS

## 2023-08-12 MED ORDER — ONDANSETRON HCL 4 MG/2ML IJ SOLN
INTRAMUSCULAR | Status: DC | PRN
  Administered 2023-08-12: 4 mg via INTRAVENOUS

## 2023-08-12 MED ORDER — METFORMIN HCL 500 MG PO TABS
500.0000 mg | ORAL_TABLET | Freq: Two times a day (BID) | ORAL | Status: DC
  Administered 2023-08-12 – 2023-08-13 (×2): 500 mg via ORAL
  Filled 2023-08-12 (×3): qty 1

## 2023-08-12 MED ORDER — DEXAMETHASONE SODIUM PHOSPHATE 10 MG/ML IJ SOLN
INTRAMUSCULAR | Status: DC | PRN
  Administered 2023-08-12: 5 mg via INTRAVENOUS

## 2023-08-12 MED ORDER — IRRISEPT - 450ML BOTTLE WITH 0.05% CHG IN STERILE WATER, USP 99.95% OPTIME
TOPICAL | Status: DC | PRN
  Administered 2023-08-12: 450 mL via TOPICAL

## 2023-08-12 MED ORDER — ASPIRIN 81 MG PO TBEC
81.0000 mg | DELAYED_RELEASE_TABLET | Freq: Every day | ORAL | Status: DC
  Administered 2023-08-12: 81 mg via ORAL
  Filled 2023-08-12 (×2): qty 1

## 2023-08-12 MED ORDER — OXYCODONE HCL 5 MG PO TABS
5.0000 mg | ORAL_TABLET | ORAL | Status: DC | PRN
  Administered 2023-08-12: 10 mg via ORAL
  Filled 2023-08-12: qty 2

## 2023-08-12 MED ORDER — VANCOMYCIN HCL 1000 MG IV SOLR
INTRAVENOUS | Status: AC
  Filled 2023-08-12: qty 20

## 2023-08-12 MED ORDER — VANCOMYCIN HCL 1000 MG IV SOLR
INTRAVENOUS | Status: DC | PRN
Start: 2023-08-12 — End: 2023-08-12
  Administered 2023-08-12: 1000 mg via TOPICAL

## 2023-08-12 MED ORDER — AMLODIPINE BESYLATE 10 MG PO TABS
10.0000 mg | ORAL_TABLET | Freq: Every day | ORAL | Status: DC
  Administered 2023-08-13: 10 mg via ORAL
  Filled 2023-08-12 (×2): qty 1

## 2023-08-12 SURGICAL SUPPLY — 82 items
AID PSTN UNV HD RSTRNT DISP (MISCELLANEOUS) ×1
ALCOHOL 70% 16 OZ (MISCELLANEOUS) ×1 IMPLANT
APL PRP STRL LF DISP 70% ISPRP (MISCELLANEOUS) ×2
BAG COUNTER SPONGE SURGICOUNT (BAG) ×1 IMPLANT
BAG SPNG CNTER NS LX DISP (BAG) ×1
BASEPLATE GLENOSPHERE 25 (Plate) IMPLANT
BEARING HUMERAL SHLDER 36M STD (Shoulder) IMPLANT
BIT DRILL TWIST 2.7 (BIT) IMPLANT
BLADE SAW SGTL 13X75X1.27 (BLADE) ×1 IMPLANT
BRNG HUM STD 36 RVRS SHLDR (Shoulder) ×1 IMPLANT
CEMENT HV SMART SET (Cement) IMPLANT
CHLORAPREP W/TINT 26 (MISCELLANEOUS) ×2 IMPLANT
CLSR STERI-STRIP ANTIMIC 1/2X4 (GAUZE/BANDAGES/DRESSINGS) IMPLANT
COOLER ICEMAN CLASSIC (MISCELLANEOUS) ×1 IMPLANT
COVER SURGICAL LIGHT HANDLE (MISCELLANEOUS) ×1 IMPLANT
DRAPE INCISE IOBAN 66X45 STRL (DRAPES) ×1 IMPLANT
DRAPE U-SHAPE 47X51 STRL (DRAPES) ×2 IMPLANT
DRSG AQUACEL AG ADV 3.5X10 (GAUZE/BANDAGES/DRESSINGS) ×1 IMPLANT
ELECT BLADE 4.0 EZ CLEAN MEGAD (MISCELLANEOUS) ×1 IMPLANT
ELECT REM PT RETURN 9FT ADLT (ELECTROSURGICAL) ×1 IMPLANT
ELECTRODE BLDE 4.0 EZ CLN MEGD (MISCELLANEOUS) IMPLANT
ELECTRODE REM PT RTRN 9FT ADLT (ELECTROSURGICAL) ×1 IMPLANT
GAUZE SPONGE 4X4 12PLY STRL LF (GAUZE/BANDAGES/DRESSINGS) ×1 IMPLANT
GLENOID SPHERE STD STRL 36MM (Orthopedic Implant) IMPLANT
GLOVE BIOGEL PI IND STRL 6.5 (GLOVE) ×1 IMPLANT
GLOVE BIOGEL PI IND STRL 8 (GLOVE) ×1 IMPLANT
GLOVE ECLIPSE 6.5 STRL STRAW (GLOVE) ×1 IMPLANT
GLOVE ECLIPSE 8.0 STRL XLNG CF (GLOVE) ×1 IMPLANT
GOWN STRL REUS W/ TWL LRG LVL3 (GOWN DISPOSABLE) ×2 IMPLANT
GOWN STRL REUS W/ TWL XL LVL3 (GOWN DISPOSABLE) ×1 IMPLANT
GOWN STRL REUS W/TWL LRG LVL3 (GOWN DISPOSABLE) ×2
GOWN STRL REUS W/TWL XL LVL3 (GOWN DISPOSABLE) ×1
GUIDE GLENOID SHLD MODEL LT (ORTHOPEDIC DISPOSABLE SUPPLIES) IMPLANT
GUIDE RSA SHLD BM ROT L SU (ORTHOPEDIC DISPOSABLE SUPPLIES) IMPLANT
HYDROGEN PEROXIDE 16OZ (MISCELLANEOUS) ×1 IMPLANT
JET LAVAGE IRRISEPT WOUND (IRRIGATION / IRRIGATOR) ×1 IMPLANT
KIT BASIN OR (CUSTOM PROCEDURE TRAY) ×1 IMPLANT
KIT TURNOVER KIT B (KITS) ×1 IMPLANT
LAVAGE JET IRRISEPT WOUND (IRRIGATION / IRRIGATOR) ×1 IMPLANT
MANIFOLD NEPTUNE II (INSTRUMENTS) ×1 IMPLANT
NDL SUT 6 .5 CRC .975X.05 MAYO (NEEDLE) ×1 IMPLANT
NEEDLE MAYO TAPER (NEEDLE) ×1
NOZZLE CEMENT SMALL (ORTHOPEDIC DISPOSABLE SUPPLIES) IMPLANT
NS IRRIG 1000ML POUR BTL (IV SOLUTION) ×1 IMPLANT
PACK SHOULDER (CUSTOM PROCEDURE TRAY) ×1 IMPLANT
PAD COLD SHLDR WRAP-ON (PAD) ×1 IMPLANT
PIN STEINMANN THREADED TIP (PIN) IMPLANT
PIN THREADED REVERSE (PIN) IMPLANT
REAMER GUIDE W/SCREW AUG (MISCELLANEOUS) IMPLANT
RESTRAINT HEAD UNIVERSAL NS (MISCELLANEOUS) ×1 IMPLANT
RETRIEVER SUT HEWSON (MISCELLANEOUS) ×1 IMPLANT
SCREW BONE STRL 6.5MMX25MM (Screw) IMPLANT
SCREW LOCKING 4.75MMX15MM (Screw) IMPLANT
SCREW LOCKING NS 4.75MMX20MM (Screw) IMPLANT
SCREW LOCKING STRL 4.75X25X3.5 (Screw) IMPLANT
SHOULDER HUMERAL BEAR 36M STD (Shoulder) ×1 IMPLANT
SLING ARM IMMOBILIZER LRG (SOFTGOODS) ×1 IMPLANT
SOL PREP POV-IOD 4OZ 10% (MISCELLANEOUS) ×1 IMPLANT
SPONGE T-LAP 18X18 ~~LOC~~+RFID (SPONGE) ×1 IMPLANT
STEM HUMERAL STRL 7MMX55MM (Stem) IMPLANT
STRIP CLOSURE SKIN 1/2X4 (GAUZE/BANDAGES/DRESSINGS) ×1 IMPLANT
SUCTION TUBE FRAZIER 10FR DISP (SUCTIONS) ×1 IMPLANT
SUT BROADBAND TAPE 2PK 1.5 (SUTURE) IMPLANT
SUT FIBERWIRE #2 38 T-5 BLUE (SUTURE) IMPLANT
SUT MAXBRAID (SUTURE) IMPLANT
SUT MNCRL AB 3-0 PS2 18 (SUTURE) ×1 IMPLANT
SUT SILK 2 0 TIES 10X30 (SUTURE) ×1 IMPLANT
SUT VIC AB 0 CT1 27 (SUTURE) ×2
SUT VIC AB 0 CT1 27XBRD ANBCTR (SUTURE) ×2 IMPLANT
SUT VIC AB 1 CT1 27 (SUTURE) ×3
SUT VIC AB 1 CT1 27XBRD ANBCTR (SUTURE) ×1 IMPLANT
SUT VIC AB 2-0 CT1 27 (SUTURE)
SUT VIC AB 2-0 CT1 TAPERPNT 27 (SUTURE) ×2 IMPLANT
SUT VIC AB 2-0 CT2 27 (SUTURE) IMPLANT
SUT VICRYL 0 UR6 27IN ABS (SUTURE) ×2 IMPLANT
SUTURE FIBERWR #2 38 T-5 BLUE (SUTURE) IMPLANT
SUTURE TAPE 1.3 40 TPR END (SUTURE) IMPLANT
SUTURETAPE 1.3 40 TPR END (SUTURE) IMPLANT
TOWEL GREEN STERILE (TOWEL DISPOSABLE) ×1 IMPLANT
TOWEL GREEN STERILE FF (TOWEL DISPOSABLE) ×1 IMPLANT
TRAY HUM MINI SHOULDER +0 40D (Shoulder) IMPLANT
WATER STERILE IRR 1000ML POUR (IV SOLUTION) ×1 IMPLANT

## 2023-08-12 NOTE — Anesthesia Procedure Notes (Signed)
Procedure Name: Intubation Date/Time: 08/12/2023 12:55 PM  Performed by: Shary Decamp, CRNAPre-anesthesia Checklist: Patient identified, Patient being monitored, Timeout performed, Emergency Drugs available and Suction available Patient Re-evaluated:Patient Re-evaluated prior to induction Oxygen Delivery Method: Circle System Utilized Preoxygenation: Pre-oxygenation with 100% oxygen Induction Type: IV induction Ventilation: Oral airway inserted - appropriate to patient size and Two handed mask ventilation required Laryngoscope Size: Miller and 2 Grade View: Grade II Tube type: Oral Tube size: 7.5 mm Number of attempts: 1 Airway Equipment and Method: Stylet Placement Confirmation: ETT inserted through vocal cords under direct vision, positive ETCO2 and breath sounds checked- equal and bilateral Secured at: 22 cm Tube secured with: Tape Dental Injury: Teeth and Oropharynx as per pre-operative assessment

## 2023-08-12 NOTE — Progress Notes (Signed)
Found irritation on left shoulder and armpit that pt reports developed after home CHG was used.

## 2023-08-12 NOTE — Op Note (Unsigned)
NAMENAJEE, NEUGEBAUER MEDICAL RECORD NO: [id number] ACCOUNT NO: [id number] DATE OF BIRTH: 1961/10/22 FACILITY: [facility] LOCATION: [id number] PHYSICIAN: Graylin Shiver. August Saucer, MD  Operative Report   DATE OF PROCEDURE: 08/12/2023  PREOPERATIVE DIAGNOSIS:  Left shoulder arthritis and biceps tendinitis.  POSTOPERATIVE DIAGNOSIS:  Left shoulder arthritis and biceps tendinitis.  PROCEDURE:  Left biceps tenodesis with reverse shoulder replacement using Biomet components, Glenosphere, mini baseplate with 35 standard glenosphere, size 7 micro stem with standard taper mini humeral tray and standard 36 mm diameter bearing.  No offset  on the humeral tray or the glenosphere.  SURGEON:  Graylin Shiver. August Saucer, MD  ASSISTANT:  Karenann Cai.  INDICATIONS:  This is a 62 year old patient with end-stage left shoulder arthritis, who presents for operative management after explanation of risks and benefits.  DESCRIPTION OF PROCEDURE:  The patient was brought to the operating room where general endotracheal anesthesia was induced.  Preoperative antibiotics administered.  Timeout was called.  The patient was placed in the beach chair position with head in  neutral position.  Left shoulder, arm and hand prescrubbed with hydrogen peroxide followed by alcohol and then Betadine, which was allowed to air dry then ChloraPrep solution and draped in sterile manner.  Ioban used to seal the operative field and cover  the operative field.  Timeout was called.  Deltopectoral approach was made.  Skin and subcutaneous tissue were sharply divided.  Cephalic vein mobilized laterally, crossing branches were ligated.  Manual elevation of the anterior portion of the deltoid  attachment was performed.  Axillary nerve was palpated and protected at all times during the case.  Next, the upper 1.5 cm of the pec was released.  Biceps tendon was then tenodesed using 5-0 Vicryl sutures to the pec tendon as well as surrounding soft  tissue.  Secure tenodesis  was achieved.  Next, the biceps tendon was released proximal to the tenodesis site and the transverse humeral ligament was opened and the rotator interval was opened to the base of the glenoid.  Circumflex vessels were then  ligated.  The subscap was then detached progressively from the lesser tuberosity using a 15 blade.  After detachment and tagging with 2-0 Vicryl sutures capsular removal from the humerus was performed using electrocautery and a Cobb elevator.  This was  done around to the 7 o'clock position on the humeral head.  At this time, the head was dislocated.  Proximal reaming was performed up to a size 8.  The patient had very hard bone.  The head was then cut in 30 degrees of retroversion, which was  approximately equal to his native version.  Next, broaching was performed up to a size 7.  The patient did have sclerotic and hard bone within the humeral head proximally.  Very Secur-Fit was achieved with that implant.  Cap was placed and attention  directed towards the glenoid.  Posterior retractor and anterior retractor was placed.  Without any muscle paralysis the labrum was circumferentially excised from the glenoid.  Care was taken to avoid injury to the axillary nerve.  Next, the  patient-specific guide was placed and 2 guide pins were placed into the glenoid.  Reaming was then performed in accordance with preoperative templating.  We used a standard baseplate.  One central compression screw and four peripheral locking screws were  placed with excellent fixation achieved.  Next, we did trial reduction with a 36 glenosphere, standard as well as +3 glenosphere.  Because the patient had some significant tendinitis, but otherwise  intact rotator cuff we chose the standard.  This was  then matched with a standard humeral baseplate and standard liner.  With those trials in position the patient had excellent stability with extension, adduction and forward force along with internal and external  rotation, 90 degrees of abduction.   Difficult to reduce and difficult to relocate.  Definitely "two fingers tight."  Trial components were removed.  Thorough irrigation performed with IrriSept solution, the glenosphere was placed with 1.5 mm inferior offset onto the dried Morse taper fit  on the baseplate.  Next, we placed the stem after placing 6 suture tapes through the lesser tuberosity.  The canal was irrigated with IrriSept and vancomycin powder placed and then we tapped in the stem, which achieved excellent fixation.  Re-reduction  with the standard offset humeral tray and standard liner gave excellent stability.  True components were then placed, thorough irrigation was performed with 2 liters of irrigating solution followed by closure of the subscap with the arm in 30 degrees of  external rotation using the suture tapes.  Nice knots were utilized. At this time, within the rotator interval, we irrigated the components again with IrriSept solution and placed vancomycin powder on the components.  The rotator interval was then closed  using #1 Vicryl suture with the arm in 30 degrees of external rotation.  Axillary nerve again palpated and found to be intact.  Deltopectoral interval was then closed using #1 Vicryl suture followed by interrupted inverted 0 Vicryl suture, 2-0 Vicryl  suture, and 3-0 Monocryl with Steri-Strips, Aquacel dressing, and large shoulder sling applied.  The patient tolerated the procedure well without immediate complications and transferred to the recovery room in stable condition.  Luke's assistance was  required at all times for retraction, opening, closing, mobilization of tissue.  His assistance was a medical necessity.   PUS D: 08/12/2023 5:12:57 pm T: 08/12/2023 7:42:00 pm  JOB: [id number]/ [id number]

## 2023-08-12 NOTE — H&P (Signed)
Timothy Warren is an 62 y.o. male.   Chief Complaint: Left shoulder pain HPI: Timothy Warren is a 62 y.o. male who presents reporting left shoulder pain.  Patient has seen [name] who states that he has likely both cervical radiculopathy and shoulder arthritis.  Patient would like to proceed with shoulder surgery first.  He does have people at home with him for support network.  Diabetes is well-controlled.  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.  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  Describes night pain and rest pain in the left shoulder..   Past Medical History:  Diagnosis Date   Allergy    Chronic kidney disease    stage 2 CKD   Diabetes mellitus without complication (HCC)    Hyperlipidemia    Hypertension    Seasonal allergies     Past Surgical History:  Procedure Laterality Date   COLONOSCOPY      Family History  Problem Relation Age of Onset   Hypertension Mother    Hypertension Father    Colon cancer Neg Hx    Colon polyps Neg Hx    Esophageal cancer Neg Hx    Rectal cancer Neg Hx    Stomach cancer Neg Hx    Social History:  reports that he has never smoked. He has never used smokeless tobacco. He reports current alcohol use. He reports current drug use. Drug: Marijuana.  Allergies:  Allergies  Allergen Reactions   Chlorhexidine Rash    Medications Prior to Admission  Medication Sig Dispense Refill   acetaminophen (TYLENOL) 500 MG tablet Take 500-1,000 mg by mouth every 6 (six) hours as needed (pain.).     amLODipine (NORVASC) 10 MG tablet Take 1 tablet (10 mg total) by mouth daily. To lower blood pressure 90 tablet 2   atorvastatin (LIPITOR) 20 MG tablet Take 1 tablet (20 mg total) by mouth daily. 90 tablet 3   hydrochlorothiazide (HYDRODIURIL) 25 MG tablet Take 1 tablet (25 mg total) by mouth daily. 90 tablet 3   metFORMIN (GLUCOPHAGE) 500 MG tablet Take 1 tablet (500 mg total) by mouth 2 (two) times daily  with a meal. 180 tablet 3   sildenafil (VIAGRA) 100 MG tablet Take 1 tablet (100 mg total) by mouth daily as needed for erectile dysfunction. 20 tablet 2   Blood Glucose Monitoring Suppl (ONETOUCH VERIO) w/Device KIT Use to check blood sugar twice a day 1 kit 0   glucose blood (ONETOUCH VERIO) test strip Use to check blood sugar twice daily. 100 each 2   OneTouch Delica Lancets 33G MISC Check blood sugar twice a day 100 each 3    Results for orders placed or performed during the hospital encounter of 08/12/23 (from the past 48 hour(s))  Glucose, capillary     Status: Abnormal   Collection Time: 08/12/23  9:05 AM  Result Value Ref Range   Glucose-Capillary 169 (H) 70 - 99 mg/dL    Comment: Glucose reference range applies only to samples taken after fasting for at least 8 hours.   No results found.  Review of Systems  Musculoskeletal:  Positive for arthralgias.  All other systems reviewed and are negative.   Blood pressure (!) 160/95, pulse 61, temperature 97.6 F (36.4 C), temperature source Oral, resp. rate 17, height 5\' 9"  (1.753 m), weight 72.6 kg, SpO2 99%. Physical Exam Vitals reviewed.  HENT:     Head: Normocephalic.     Nose:  Nose normal.     Mouth/Throat:     Mouth: Mucous membranes are moist.  Eyes:     Pupils: Pupils are equal, round, and reactive to light.  Cardiovascular:     Rate and Rhythm: Normal rate.     Pulses: Normal pulses.  Pulmonary:     Effort: Pulmonary effort is normal.  Abdominal:     General: Abdomen is flat.  Musculoskeletal:     Cervical back: Normal range of motion.  Skin:    General: Skin is warm.     Capillary Refill: Capillary refill takes less than 2 seconds.  Neurological:     General: No focal deficit present.     Mental Status: He is alert.  Psychiatric:        Mood and Affect: Mood normal.    Ortho exam demonstrates range of motion on the left of 30 /95/110. Rotator cuff strength is pretty reasonable to infraspinatus supraspinatus  and subscap muscle testing. Motor or sensory function in the hand is intact with 5 out of 5 grip EPL FPL interosseous wrist flexion extension biceps triceps and deltoid strength. No definite paresthesias today in either arm C5-T1.   Assessment/Plan Impression is left shoulder arthritis. Does have  external rotation to 25.. Forward flexion is limited. No muscle atrophy on prior CT scan.  CT scan done for preop patient specific instrumentation. Plan either anatomic shoulder replacement versus reverse shoulder replacement. Really depends on the status of the rotator cuff as well as the amount of deformity present. The risk and benefits of surgical intervention are discussed including not limited to infection nerve or vessel damage instability as well as potential longevity issues with the prosthesis. Patient understands the risk and benefits and wishes to proceed. All questions answered.   Burnard Bunting, MD 08/12/2023, 11:27 AM

## 2023-08-12 NOTE — Brief Op Note (Signed)
   08/12/2023  5:04 PM  PATIENT:  Timothy Warren  62 y.o. male  PRE-OPERATIVE DIAGNOSIS:  left shoulder osteoarthritis, biceps tendinitis  POST-OPERATIVE DIAGNOSIS:  left shoulder osteoarthritis, biceps tendinitis  PROCEDURE:  Procedure(s): REVERSE SHOULDER ARTHROPLASTY BICEPS TENODESIS  SURGEON:  Surgeon(s): Cammy Copa, MD  ASSISTANT: magnant pa   ANESTHESIA:   general  EBL: 125 ml    Total I/O In: 400 [I.V.:300; IV Piggyback:100] Out: 125 [Blood:125]  BLOOD ADMINISTERED: none  DRAINS: none   LOCAL MEDICATIONS USED:  none  SPECIMEN:  No Specimen  COUNTS:  YES  TOURNIQUET:  * No tourniquets in log *  DICTATION: .Other Dictation: Dictation Number [id number]  PLAN OF CARE: Admit for overnight observation  PATIENT DISPOSITION:  PACU - hemodynamically stable

## 2023-08-12 NOTE — Transfer of Care (Signed)
Immediate Anesthesia Transfer of Care Note  Patient: Timothy Warren  Procedure(s) Performed: REVERSE SHOULDER ARTHROPLASTY (Left: Shoulder) BICEPS TENODESIS (Left: Shoulder)  Patient Location: PACU  Anesthesia Type:General and Regional  Level of Consciousness: awake, alert , and oriented  Airway & Oxygen Therapy: Patient Spontanous Breathing and Patient connected to nasal cannula oxygen  Post-op Assessment: Report given to RN and Post -op Vital signs reviewed and stable  Post vital signs: Reviewed and stable  Last Vitals:  Vitals Value Taken Time  BP 121/87 08/12/23 1715  Temp 36 C 08/12/23 1712  Pulse 81 08/12/23 1717  Resp 24 08/12/23 1717  SpO2 93 % 08/12/23 1717  Vitals shown include unfiled device data.  Last Pain:  Vitals:   08/12/23 1150  TempSrc:   PainSc: 0-No pain         Complications: No notable events documented.

## 2023-08-12 NOTE — Anesthesia Procedure Notes (Signed)
Anesthesia Regional Block: Interscalene brachial plexus block   Pre-Anesthetic Checklist: , timeout performed,  Correct Patient, Correct Site, Correct Laterality,  Correct Procedure, Correct Position, site marked,  Risks and benefits discussed,  Pre-op evaluation,  At surgeon's request and post-op pain management  Laterality: Left  Prep: Maximum Sterile Barrier Precautions used, chloraprep       Needles:  Injection technique: Single-shot  Needle Type: Echogenic Stimulator Needle     Needle Length: 4cm  Needle Gauge: 22     Additional Needles:   Procedures:,,,, ultrasound used (permanent image in chart),,    Narrative:  Start time: 08/12/2023 11:39 AM End time: 08/12/2023 11:42 AM Injection made incrementally with aspirations every 5 mL.  Performed by: Personally  Anesthesiologist: Kaylyn Layer, MD  Additional Notes: Risks, benefits, and alternative discussed. Patient gave consent for procedure. Patient prepped and draped in sterile fashion. Sedation administered, patient remains easily responsive to voice. Relevant anatomy identified with ultrasound guidance. Local anesthetic given in 5cc increments with no signs or symptoms of intravascular injection. No pain or paraesthesias with injection. Patient monitored throughout procedure with signs of LAST or immediate complications. Tolerated well. Ultrasound image placed in chart.  Timothy Greenhouse, MD
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
## 2023-08-13 ENCOUNTER — Other Ambulatory Visit (HOSPITAL_COMMUNITY): Payer: Self-pay

## 2023-08-13 DIAGNOSIS — Z7984 Long term (current) use of oral hypoglycemic drugs: Secondary | ICD-10-CM | POA: Diagnosis not present

## 2023-08-13 DIAGNOSIS — E1122 Type 2 diabetes mellitus with diabetic chronic kidney disease: Secondary | ICD-10-CM | POA: Diagnosis not present

## 2023-08-13 DIAGNOSIS — M7522 Bicipital tendinitis, left shoulder: Secondary | ICD-10-CM | POA: Diagnosis not present

## 2023-08-13 DIAGNOSIS — Z79899 Other long term (current) drug therapy: Secondary | ICD-10-CM | POA: Diagnosis not present

## 2023-08-13 DIAGNOSIS — N182 Chronic kidney disease, stage 2 (mild): Secondary | ICD-10-CM | POA: Diagnosis not present

## 2023-08-13 DIAGNOSIS — M19012 Primary osteoarthritis, left shoulder: Secondary | ICD-10-CM | POA: Diagnosis not present

## 2023-08-13 DIAGNOSIS — I129 Hypertensive chronic kidney disease with stage 1 through stage 4 chronic kidney disease, or unspecified chronic kidney disease: Secondary | ICD-10-CM | POA: Diagnosis not present

## 2023-08-13 MED ORDER — ASPIRIN 81 MG PO TBEC
81.0000 mg | DELAYED_RELEASE_TABLET | Freq: Every day | ORAL | 0 refills | Status: AC
  Filled 2023-08-13: qty 30, 30d supply, fill #0

## 2023-08-13 MED ORDER — DOCUSATE SODIUM 100 MG PO CAPS
100.0000 mg | ORAL_CAPSULE | Freq: Two times a day (BID) | ORAL | 0 refills | Status: DC
  Filled 2023-08-13: qty 10, 5d supply, fill #0

## 2023-08-13 MED ORDER — CELECOXIB 100 MG PO CAPS
100.0000 mg | ORAL_CAPSULE | Freq: Two times a day (BID) | ORAL | 0 refills | Status: DC
  Filled 2023-08-13: qty 60, 30d supply, fill #0

## 2023-08-13 MED ORDER — METHOCARBAMOL 500 MG PO TABS
500.0000 mg | ORAL_TABLET | Freq: Three times a day (TID) | ORAL | 0 refills | Status: DC | PRN
  Filled 2023-08-13: qty 30, 10d supply, fill #0

## 2023-08-13 MED ORDER — OXYCODONE HCL 5 MG PO TABS
5.0000 mg | ORAL_TABLET | ORAL | 0 refills | Status: DC | PRN
  Filled 2023-08-13: qty 30, 5d supply, fill #0

## 2023-08-13 NOTE — Progress Notes (Signed)
Patient reevaluated.  Has performed well with physical therapy but did need to use a cane when he was walking.  He is planning to purchase one later today at [facility].  He does have reevaluation of dressing with no change in the appearance since application.  Plan is discharge home today.  Return precautions discussed with him.

## 2023-08-13 NOTE — Progress Notes (Signed)
  Subjective: Timothy Warren is a 62 y.o. male s/p left RSA.  They are POD 1.  Pt's pain is controlled.  Patient denies any complaints of chest pain, shortness of breath, abdominal pain.  No complaints but he did try to ambulate with occupational therapy this morning and had some awkwardness with his gait.  He feels like his legs are a little numb when he tries to walk.  He attributes this possibly to the SCD machine that has been on since surgery.  Objective: Vital signs in last 24 hours: Temp:  [96.8 F (36 C)-98.3 F (36.8 C)] 98.3 F (36.8 C) (10/09 0748) Pulse Rate:  [57-95] 61 (10/09 0748) Resp:  [14-21] 18 (10/09 0748) BP: (112-159)/(66-96) 131/84 (10/09 0748) SpO2:  [92 %-100 %] 99 % (10/09 0748)  Intake/Output from previous day: 10/08 0701 - 10/09 0700 In: 500 [I.V.:300; IV Piggyback:200] Out: 925 [Urine:800; Blood:125] Intake/Output this shift: No intake/output data recorded.  Exam:  Aquacel dressing was saturated.  This was removed revealing persistent bleeding from the superior aspect of the incision. 2+ radial pulse of the operative extremity Postoperative physical exam somewhat limited by interscalene block but intact EPL, FPL, finger abduction, pronation/supination, bicep, tricep, deltoid.  Axillary nerve intact with deltoid firing.   Labs: No results for input(s): "HGB" in the last 72 hours. No results for input(s): "WBC", "RBC", "HCT", "PLT" in the last 72 hours. No results for input(s): "NA", "K", "CL", "CO2", "BUN", "CREATININE", "GLUCOSE", "CALCIUM" in the last 72 hours. No results for input(s): "LABPT", "INR" in the last 72 hours.  Assessment/Plan: Pt is POD 1 s/p left RSA    -Plan to discharge to home today or tomorrow pending patient's pain and ability to ambulate with physical therapy later today  -No lifting with the operative arm  -Stay in sling except for showering/sleeping and using CPM machine at home.  No lifting with the operative arm more than 1 to  2 pounds  -Follow-up with myself in clinic 7 to 10 days from now for suture removal     [facility] 08/13/2023, 9:08 AM

## 2023-08-13 NOTE — Progress Notes (Signed)
Patient had dressing removed due to saturation.  Had a little bit persistent bleeding through 1 area at the superior aspect of the incision.  Steri-Strips were removed and the incision overall looks good with no gross dehiscence.  There is just 1 persistent area where blood is trickling out.  The area was cleansed with alcohol, Betadine, ChloraPrep.  Sterile technique with sterile gloves was utilized in order to place four 3-0 nylon sutures in the superior aspect of the incision.  There is no recurrent bleeding.  New Steri-Strips were placed.  New Aquacel dressing placed.  We will monitor this and I will return at lunch in order to ensure no recurrent bleeding.  We need to keep him around anyway to monitor his ambulation.  If he is ambulating well at lunch and no recurrent bleeding, plan for discharge later today.

## 2023-08-13 NOTE — Evaluation (Signed)
Physical Therapy Evaluation and Discharge Patient Details Name: Timothy Warren MRN: [id number] DOB: [birth year] Today's Date: 08/13/2023  History of Present Illness  62 year old patient with end-stage left shoulder arthritis s/p reverse total shoulder arthroplasty biceps tenodesis. PMH CKD stage 2, DM, hyperlipidemia, HTN  Clinical Impression  Patient evaluated by Physical Therapy with no further acute PT needs identified. Overall walked well with a cane, encouraged pt to move slowly, esp with steps; Plans to borrow or purchase a cane; rec Outpt PT for gait and balance; All education has been completed and the patient has no further questions. OK for dc home from PT standpoint  See below for any follow-up Physical Therapy or equipment needs. PT is signing off. Thank you for this referral.         If plan is discharge home, recommend the following: A little help with bathing/dressing/bathroom;Assistance with cooking/housework   Can travel by private vehicle        Equipment Recommendations Cane  Recommendations for Other Services       Functional Status Assessment Patient has had a recent decline in their functional status and demonstrates the ability to make significant improvements in function in a reasonable and predictable amount of time.     Precautions / Restrictions Precautions Precautions: Shoulder Type of Shoulder Precautions: Reverse total shoulder, 0-30 ER, no AROM, can do pendulums Shoulder Interventions: Shoulder sling/immobilizer;At all times;Off for dressing/bathing/exercises Precaution Booklet Issued: Yes (comment) Precaution Comments: hand out given Required Braces or Orthoses: Sling Restrictions LUE Weight Bearing: Non weight bearing      Mobility  Bed Mobility Overal bed mobility: Modified Independent                  Transfers Overall transfer level: Needs assistance Equipment used: None Transfers: Sit to/from Stand Sit to Stand: Contact guard  assist           General transfer comment: Dependent on RUE to steady himself    Ambulation/Gait Ambulation/Gait assistance: Min assist, Contact guard assist Gait Distance (Feet): 300 Feet Assistive device: 1 person hand held assist, Straight cane Gait Pattern/deviations: Wide base of support, Decreased dorsiflexion - right, Decreased dorsiflexion - left       General Gait Details: Wide step width, heavy footfalls with stepping; pt describes not being able to feel his feet; uncoordinated steps  Stairs Stairs: Yes Stairs assistance: Supervision Stair Management: One rail Left, Step to pattern, Forwards Number of Stairs: 5 (x2) General stair comments: Using L rail well for stability; cues to take steps slowly; managing well  Wheelchair Mobility     Tilt Bed    Modified Rankin (Stroke Patients Only)       Balance Overall balance assessment: Needs assistance Sitting-balance support: No upper extremity supported, Feet supported Sitting balance-Leahy Scale: Good Sitting balance - Comments: EOB     Standing balance-Leahy Scale: Fair                               Pertinent Vitals/Pain Pain Assessment Pain Assessment: No/denies pain Pain Intervention(s): Monitored during session    Home Living Family/patient expects to be discharged to:: Private residence Living Arrangements: Spouse/significant other Available Help at Discharge: Family Type of Home: House Home Access: Stairs to enter Entrance Stairs-Rails: Left Entrance Stairs-Number of Steps: 3   Home Layout: One level Home Equipment: None Additional Comments: Pt lives with fiance who is available 24/7.    Prior Function Prior Level of  Function : Independent/Modified Independent             Mobility Comments: independent, is a Copy ADLs Comments: ind     Extremity/Trunk Assessment   Upper Extremity Assessment Upper Extremity Assessment: Defer to OT evaluation    Lower Extremity  Assessment Lower Extremity Assessment: RLE deficits/detail;LLE deficits/detail RLE Deficits / Details: Noteworthy for hard footfall, decr coordination of steps LLE Deficits / Details: Noteworthy for hard footfall, decr coordination of steps       Communication   Communication Communication: No apparent difficulties  Cognition Arousal: Alert Behavior During Therapy: WFL for tasks assessed/performed Overall Cognitive Status: Within Functional Limits for tasks assessed                                          General Comments General comments (skin integrity, edema, etc.): Discussed considerations for getting a cane    Exercises     Assessment/Plan    PT Assessment All further PT needs can be met in the next venue of care  PT Problem List Decreased coordination;Decreased knowledge of use of DME;Decreased balance       PT Treatment Interventions      PT Goals (Current goals can be found in the Care Plan section)  Acute Rehab PT Goals Patient Stated Goal: Back to work PT Goal Formulation: All assessment and education complete, DC therapy    Frequency       Co-evaluation               AM-PAC PT "6 Clicks" Mobility  Outcome Measure Help needed turning from your back to your side while in a flat bed without using bedrails?: None Help needed moving from lying on your back to sitting on the side of a flat bed without using bedrails?: None Help needed moving to and from a bed to a chair (including a wheelchair)?: None Help needed standing up from a chair using your arms (e.g., wheelchair or bedside chair)?: None Help needed to walk in hospital room?: A Little Help needed climbing 3-5 steps with a railing? : A Little 6 Click Score: 22    End of Session Equipment Utilized During Treatment: Gait belt Activity Tolerance: Patient tolerated treatment well Patient left: in chair;with call bell/phone within reach Nurse Communication: Mobility status PT  Visit Diagnosis: Other abnormalities of gait and mobility (R26.89)    Time: 1610-9604 PT Time Calculation (min) (ACUTE ONLY): 25 min   Charges:   PT Evaluation $PT Eval Low Complexity: 1 Low PT Treatments $Gait Training: 8-22 mins PT General Charges $$ ACUTE PT VISIT: 1 Visit         Van Clines, PT  Acute Rehabilitation Services Office [phone] Secure Chat welcomed   Levi Aland 08/13/2023, 1:56 PM

## 2023-08-13 NOTE — Progress Notes (Signed)
Discharge instructions given. Patient verbalized understanding and all questions were answered.  ?

## 2023-08-13 NOTE — Anesthesia Postprocedure Evaluation (Signed)
Anesthesia Post Note  Patient: Timothy Warren  Procedure(s) Performed: REVERSE SHOULDER ARTHROPLASTY (Left: Shoulder) BICEPS TENODESIS (Left: Shoulder)     Patient location during evaluation: PACU Anesthesia Type: Regional and General Level of consciousness: awake and alert Pain management: pain level controlled Vital Signs Assessment: post-procedure vital signs reviewed and stable Respiratory status: spontaneous breathing, nonlabored ventilation, respiratory function stable and patient connected to nasal cannula oxygen Cardiovascular status: blood pressure returned to baseline and stable Postop Assessment: no apparent nausea or vomiting Anesthetic complications: no   No notable events documented.  Last Vitals:  Vitals:   08/13/23 0500 08/13/23 0748  BP: 119/83 131/84  Pulse: 68 61  Resp: 19 18  Temp: 36.7 C 36.8 C  SpO2: 100% 99%    Last Pain:  Vitals:   08/13/23 0835  TempSrc:   PainSc: 0-No pain                 Merdis Snodgrass S

## 2023-08-13 NOTE — Evaluation (Signed)
Occupational Therapy Evaluation Patient Details Name: Timothy Warren MRN: [id number] DOB: Jul 19, 1961 Today's Date: 08/13/2023   History of Present Illness 62 year old patient with end-stage left shoulder arthritis s/p reverse total shoulder arthroplasty biceps tenodesis. PMH CKD stage 2, DM, hyperlipidemia, HTN   Clinical Impression   Pt s/p above diagnosis. Pt c/o minimal pain, 2/10 at rest. Upon sitting on EOB Pt c/o B foot numbness, difficulty walking when attempted, new symptom. PT and PA informed. PA to check sutures after session. Pt PLOF independent, works as Copy, lives with fiance in single story house who is available 24/7. Pt currently requires min A for ADLs, displays decreased balance. Pt educated on precautions and sling wear schedule. Pt and fiance educated on how to don/doff sling and perform dressing/bathing while maintaining precautions. Pt educated on pendulums to maintain shoulder PROM. Pt to follow doctors orders for therapy needs, has no further acute OT needs.        If plan is discharge home, recommend the following: A little help with walking and/or transfers;A little help with bathing/dressing/bathroom;Assistance with cooking/housework;Assist for transportation;Help with stairs or ramp for entrance    Functional Status Assessment  Patient has had a recent decline in their functional status and demonstrates the ability to make significant improvements in function in a reasonable and predictable amount of time.  Equipment Recommendations  Other (comment) (PT to see for mobility needs)    Recommendations for Other Services       Precautions / Restrictions Precautions Precautions: Shoulder Type of Shoulder Precautions: Reverse total shoulder, 0-30 ER, no AROM, can do pendulums Shoulder Interventions: Shoulder sling/immobilizer;At all times;Off for dressing/bathing/exercises Precaution Booklet Issued: Yes (comment) Precaution Comments: hand out given Required  Braces or Orthoses: Sling Restrictions Weight Bearing Restrictions: Yes LUE Weight Bearing: Non weight bearing Other Position/Activity Restrictions: `      Mobility Bed Mobility Overal bed mobility: Needs Assistance Bed Mobility: Supine to Sit     Supine to sit: Supervision     General bed mobility comments: supervision for bed mobility to maintain precautions    Transfers Overall transfer level: Needs assistance Equipment used: None Transfers: Sit to/from Stand, Bed to chair/wheelchair/BSC Sit to Stand: Contact guard assist           General transfer comment: CGA, not steady with ambulation at this time      Balance Overall balance assessment: Needs assistance Sitting-balance support: No upper extremity supported, Feet supported Sitting balance-Leahy Scale: Good Sitting balance - Comments: EOB   Standing balance support: No upper extremity supported Standing balance-Leahy Scale: Fair Standing balance comment: fair standing balance, tolerates min-mod challenge, unsteady and B foot numbness, new symptoms                           ADL either performed or assessed with clinical judgement   ADL Overall ADL's : Needs assistance/impaired Eating/Feeding: Set up;Sitting   Grooming: Set up;Sitting   Upper Body Bathing: Minimal assistance;Sitting   Lower Body Bathing: Minimal assistance;Sitting/lateral leans   Upper Body Dressing : Minimal assistance;Sitting   Lower Body Dressing: Moderate assistance;Sitting/lateral leans   Toilet Transfer: Contact guard assist;Ambulation   Toileting- Clothing Manipulation and Hygiene: Minimal assistance;Sitting/lateral lean       Functional mobility during ADLs: Contact guard assist General ADL Comments: Pt min A for dressing/bathing, good overall ROM with RUE and ability to reach BLEs to complete LB ADLs.     Vision Baseline Vision/History: 0 No visual deficits  Ability to See in Adequate Light: 0  Adequate Patient Visual Report: No change from baseline       Perception         Praxis         Pertinent Vitals/Pain Pain Assessment Pain Assessment: 0-10 Pain Score: 2  Pain Location: L shoulder Pain Descriptors / Indicators: Constant, Grimacing Pain Intervention(s): Monitored during session     Extremity/Trunk Assessment Upper Extremity Assessment Upper Extremity Assessment: LUE deficits/detail LUE Deficits / Details: Reverse total shoulder, 0-30 degrees ER, no AROM, sling at all times except during ADLs/exercises, can do pendulums LUE: Shoulder pain at rest LUE Sensation: WNL LUE Coordination: decreased gross motor           Communication Communication Communication: No apparent difficulties   Cognition Arousal: Alert Behavior During Therapy: WFL for tasks assessed/performed Overall Cognitive Status: Within Functional Limits for tasks assessed                                       General Comments       Exercises     Shoulder Instructions      Home Living Family/patient expects to be discharged to:: Private residence Living Arrangements: Spouse/significant other   Type of Home: House Home Access: Stairs to enter Secretary/administrator of Steps: 3 Entrance Stairs-Rails: Left Home Layout: One level     Bathroom Shower/Tub: Tub/shower unit         Home Equipment: None   Additional Comments: Pt lives with fiance who is available 24/7.      Prior Functioning/Environment Prior Level of Function : Independent/Modified Independent             Mobility Comments: independent, is a Copy ADLs Comments: ind        OT Problem List: Decreased strength;Decreased range of motion;Impaired balance (sitting and/or standing);Impaired sensation;Impaired UE functional use;Pain      OT Treatment/Interventions: Self-care/ADL training;Therapeutic exercise;Energy conservation;Therapeutic activities;DME and/or AE  instruction;Patient/family education;Balance training    OT Goals(Current goals can be found in the care plan section) Acute Rehab OT Goals Patient Stated Goal: to return to work OT Goal Formulation: With patient/family Time For Goal Achievement: 08/27/23 Potential to Achieve Goals: Good  OT Frequency: Min 1X/week    Co-evaluation              AM-PAC OT "6 Clicks" Daily Activity     Outcome Measure Help from another person eating meals?: A Little Help from another person taking care of personal grooming?: A Little Help from another person toileting, which includes using toliet, bedpan, or urinal?: A Little Help from another person bathing (including washing, rinsing, drying)?: A Little Help from another person to put on and taking off regular upper body clothing?: A Little Help from another person to put on and taking off regular lower body clothing?: A Little 6 Click Score: 18   End of Session Equipment Utilized During Treatment: Gait belt Nurse Communication: Mobility status  Activity Tolerance: Patient tolerated treatment well Patient left: in bed;with family/visitor present;Other (comment) (with PA)  OT Visit Diagnosis: Unsteadiness on feet (R26.81);Muscle weakness (generalized) (M62.81);Pain Pain - Right/Left: Left Pain - part of body: Shoulder                Time: 4098-1191 OT Time Calculation (min): 25 min Charges:  OT General Charges $OT Visit: 1 Visit OT Evaluation $OT Eval Low Complexity: 1 Low  OT Treatments $Self Care/Home Management : 8-22 mins  [facility], OTR/L   Alexis Goodell 08/13/2023, 8:53 AM

## 2023-08-14 ENCOUNTER — Telehealth: Payer: Self-pay

## 2023-08-14 NOTE — Transitions of Care (Post Inpatient/ED Visit) (Signed)
08/14/2023  Name: Timothy Warren MRN: [id number] DOB: Jun 07, 1961  Today's TOC FU Call Status: Today's TOC FU Call Status:: Successful TOC FU Call Completed TOC FU Call Complete Date: 08/14/23 Patient's Name and Date of Birth confirmed.  Transition Care Management Follow-up Telephone Call Date of Discharge: 08/13/23 Discharge Facility: [facility]) Type of Discharge: Inpatient Admission Primary Inpatient Discharge Diagnosis:: reverse left  total shoulder arthroplasty. How have you been since you were released from the hospital?: Better (He stated he feels great.) Any questions or concerns?: Yes Patient Questions/Concerns:: He wants to know if he can sleep on his non -surgical side.  He said he read that he needs to sleep on his back.  I told him to contact the orthopedic surgeon if he has questions about the instructions. Patient Questions/Concerns Addressed: Other: (He needs to call the surgeon.)  Items Reviewed: Did you receive and understand the discharge instructions provided?: Yes Medications obtained,verified, and reconciled?: No Medications Not Reviewed Reasons:: Other: (He said he has all of his medications as well as a working glucometer and he did not have any questions about the med regime and he did not need to review the med list.) Any new allergies since your discharge?: No Dietary orders reviewed?: Yes Type of Diet Ordered:: heart healthy, low sodium, diabetic Do you have support at home?: Yes Name of Support/Comfort Primary Source: He said he has help but did not specify who.  Medications Reviewed Today: Medications Reviewed Today   Medications were not reviewed in this encounter     Home Care and Equipment/Supplies: Were Home Health Services Ordered?: No Any new equipment or medical supplies ordered?: Yes Name of Medical supply agency?: he received a CPM machine for his arm. Were you able to get the equipment/medical supplies?: Yes Do you have any questions  related to the use of the equipment/supplies?: No (He said he has it but has not used it but knows how to use it.)  Functional Questionnaire: Do you need assistance with bathing/showering or dressing?: Yes Do you need assistance with meal preparation?: Yes Do you need assistance with eating?: No Do you have difficulty maintaining continence: No Do you need assistance with getting out of bed/getting out of a chair/moving?: Yes (wears sling on left arm that he said he even wears while sleeping.  He uses a cane with ambulation. He said that the dressing remains intact, no drainage reported.) Do you have difficulty managing or taking your medications?: Yes  Follow up appointments reviewed: PCP Follow-up appointment confirmed?: No (His appointment with Dr Laural Benes is not until 11/14/2023 and he did not want to schedule an appointment to be seen sooner) MD Provider Line [phone] Given: No Specialist Hospital Follow-up appointment confirmed?: Yes Date of Specialist follow-up appointment?: 08/22/23 Follow-Up Specialty Provider:: orthopedic surgeon Do you need transportation to your follow-up appointment?: No Do you understand care options if your condition(s) worsen?: Yes-patient verbalized understanding    SIGNATURE. Robyne Peers, RN

## 2023-08-15 ENCOUNTER — Telehealth: Payer: Self-pay

## 2023-08-15 NOTE — Telephone Encounter (Signed)
Per Alycia Rossetti he would have to buy this as the hospital purchases these. Advised patient best just to use ice packs instead.

## 2023-08-15 NOTE — Telephone Encounter (Signed)
Pt called and states that he left his ice machine at the hospital when he d/c on Thursday. He is s/p a reverse total shoulder and wanted to know if there was anything that he could o. He said that he already called the hospital and they said that the room had been cleaned and anything left behind discarded. I advised pt that he can fill a zip lock bag with ice wrap with towel and apply and I would send a message to the assistant to see if there is anything that we can do about getting another machine?

## 2023-08-15 NOTE — Telephone Encounter (Signed)
I have emailed Ryan to see what we can do. Pending response from him.

## 2023-08-18 ENCOUNTER — Encounter (HOSPITAL_COMMUNITY): Payer: Self-pay | Admitting: Orthopedic Surgery

## 2023-08-22 ENCOUNTER — Other Ambulatory Visit (INDEPENDENT_AMBULATORY_CARE_PROVIDER_SITE_OTHER): Payer: Self-pay

## 2023-08-22 ENCOUNTER — Other Ambulatory Visit: Payer: Self-pay

## 2023-08-22 ENCOUNTER — Ambulatory Visit (INDEPENDENT_AMBULATORY_CARE_PROVIDER_SITE_OTHER): Payer: BC Managed Care – PPO | Admitting: Surgical

## 2023-08-22 ENCOUNTER — Encounter: Payer: Self-pay | Admitting: Surgical

## 2023-08-22 DIAGNOSIS — M19012 Primary osteoarthritis, left shoulder: Secondary | ICD-10-CM

## 2023-08-22 NOTE — Progress Notes (Signed)
Post-Op Visit Note   Patient: Timothy Warren           Date of Birth: Oct 24, 1961           MRN: [id number] Visit Date: 08/22/2023 PCP: Storm Frisk, MD   Assessment & Plan:  Chief Complaint:  Chief Complaint  Patient presents with   Left Shoulder - Routine Post Op   Visit Diagnoses:  1. Primary osteoarthritis, left shoulder     Plan: Timothy Warren is a 62 y.o. male who presents s/p left reverse shoulder arthroplasty on 08/12/2023.  Patient is doing well and pain is overall controlled.  Using CPM machine as instructed but he just started doing this today as the machine was not really working up until today..  Denies any chest pain, SOB, fevers, chills.  No complaint of any instability symptoms.  Not taking any medication for pain.  States that pain is "better than before surgery"..    On exam, patient has range of motion 15 degrees of external rotation, 70 degrees abduction, 110 degrees forward elevation passively.  Intact EPL, FPL, finger abduction, finger adduction, pronation/supination, bicep, tricep, deltoid of operative extremity.  Axillary nerve intact with deltoid firing.  Incision is healing well without evidence of infection or dehiscence.  Incision was made sure to be covered with Steri-Strips from the proximal to distal aspect of the length of the incision.  2+ radial pulse of the operative extremity.  4 nylon sutures were removed from the superior aspect of the incision and Steri-Strips were covered.  There is no sign of dehiscence or infection in this area.  Plan is discontinue sling.  Okay to very lightly lift with the operative extremity but no lifting anything heavier than a coffee cup or cell phone.  Start physical therapy to focus on passive range of motion and active range of motion with deltoid isometrics.  Do not want to externally rotate past 30 degrees to protect subscapularis repair.  Follow-up in 4 weeks for clinical recheck with [name].  Follow-Up  Instructions: No follow-ups on file.   Orders:  Orders Placed This Encounter  Procedures   XR Shoulder Left   No orders of the defined types were placed in this encounter.   Imaging: No results found.  PMFS History: Patient Active Problem List   Diagnosis Date Noted   OA (osteoarthritis) of shoulder 08/12/2023   S/P reverse total shoulder arthroplasty, left 08/12/2023   Onychomycosis 07/30/2022   Chronic gout without tophus 07/30/2022   Primary osteoarthritis, left shoulder 11/13/2021   Chronic right shoulder pain 10/16/2021   Hyperlipidemia due to type 2 diabetes mellitus (HCC) 05/28/2021   Type 2 diabetes mellitus without complication, without long-term current use of insulin (HCC) 08/12/2020   Stage 3a chronic kidney disease (HCC) 08/12/2020   Erectile dysfunction associated with type 2 diabetes mellitus (HCC) 08/12/2020   Hypertension 02/17/2020   Past Medical History:  Diagnosis Date   Allergy    Chronic kidney disease    stage 2 CKD   Diabetes mellitus without complication (HCC)    Hyperlipidemia    Hypertension    Seasonal allergies     Family History  Problem Relation Age of Onset   Hypertension Mother    Hypertension Father    Colon cancer Neg Hx    Colon polyps Neg Hx    Esophageal cancer Neg Hx    Rectal cancer Neg Hx    Stomach cancer Neg Hx     Past  Surgical History:  Procedure Laterality Date   BICEPT TENODESIS Left 08/12/2023   Procedure: BICEPS TENODESIS;  Surgeon: Cammy Copa, MD;  Location: [facility] OR;  Service: Orthopedics;  Laterality: Left;   COLONOSCOPY     REVERSE SHOULDER ARTHROPLASTY Left 08/12/2023   Procedure: REVERSE SHOULDER ARTHROPLASTY;  Surgeon: Cammy Copa, MD;  Location: [facility] OR;  Service: Orthopedics;  Laterality: Left;   Social History   Occupational History   Not on file  Tobacco Use   Smoking status: Never   Smokeless tobacco: Never  Vaping Use   Vaping status: Never Used  Substance and Sexual Activity    Alcohol use: Yes    Comment: maybe a drink once a month   Drug use: Yes    Types: Marijuana    Comment: Daily   Sexual activity: Yes

## 2023-08-26 ENCOUNTER — Ambulatory Visit (INDEPENDENT_AMBULATORY_CARE_PROVIDER_SITE_OTHER): Payer: BC Managed Care – PPO | Admitting: Physical Therapy

## 2023-08-26 ENCOUNTER — Encounter: Payer: Self-pay | Admitting: Physical Therapy

## 2023-08-26 ENCOUNTER — Other Ambulatory Visit: Payer: Self-pay

## 2023-08-26 DIAGNOSIS — M25612 Stiffness of left shoulder, not elsewhere classified: Secondary | ICD-10-CM | POA: Diagnosis not present

## 2023-08-26 DIAGNOSIS — M6281 Muscle weakness (generalized): Secondary | ICD-10-CM | POA: Diagnosis not present

## 2023-08-26 DIAGNOSIS — M25512 Pain in left shoulder: Secondary | ICD-10-CM

## 2023-08-26 DIAGNOSIS — R600 Localized edema: Secondary | ICD-10-CM

## 2023-08-26 NOTE — Therapy (Signed)
OUTPATIENT PHYSICAL THERAPY SHOULDER EVALUATION   Patient Name: Timothy Warren MRN: [id number] DOB:[birth year], 62 y.o., male Today's Date: 08/26/2023  END OF SESSION:  PT End of Session - 08/26/23 1403     Visit Number 1    Number of Visits 12    Date for PT Re-Evaluation 10/21/23    PT Start Time 1345    PT Stop Time 1415    PT Time Calculation (min) 30 min    Activity Tolerance Patient tolerated treatment well    Behavior During Therapy [facility] for tasks assessed/performed             Past Medical History:  Diagnosis Date   Allergy    Chronic kidney disease    stage 2 CKD   Diabetes mellitus without complication (HCC)    Hyperlipidemia    Hypertension    Seasonal allergies    Past Surgical History:  Procedure Laterality Date   BICEPT TENODESIS Left 08/12/2023   Procedure: BICEPS TENODESIS;  Surgeon: Cammy Copa, MD;  Location: [facility] OR;  Service: Orthopedics;  Laterality: Left;   COLONOSCOPY     REVERSE SHOULDER ARTHROPLASTY Left 08/12/2023   Procedure: REVERSE SHOULDER ARTHROPLASTY;  Surgeon: Cammy Copa, MD;  Location: [facility] OR;  Service: Orthopedics;  Laterality: Left;   Patient Active Problem List   Diagnosis Date Noted   OA (osteoarthritis) of shoulder 08/12/2023   S/P reverse total shoulder arthroplasty, left 08/12/2023   Onychomycosis 07/30/2022   Chronic gout without tophus 07/30/2022   Primary osteoarthritis, left shoulder 11/13/2021   Chronic right shoulder pain 10/16/2021   Hyperlipidemia due to type 2 diabetes mellitus (HCC) 05/28/2021   Type 2 diabetes mellitus without complication, without long-term current use of insulin (HCC) 08/12/2020   Stage 3a chronic kidney disease (HCC) 08/12/2020   Erectile dysfunction associated with type 2 diabetes mellitus (HCC) 08/12/2020   Hypertension 02/17/2020    PCP: Storm Frisk, MD   REFERRING PROVIDER: Julieanne Cotton, PA-C   REFERRING DIAG: [phone] (ICD-10-CM) - Primary osteoarthritis,  left shoulder  THERAPY DIAG:  Acute pain of left shoulder  Stiffness of left shoulder, not elsewhere classified  Muscle weakness (generalized)  Localized edema  Rationale for Evaluation and Treatment: Rehabilitation  ONSET DATE: DOS 08/12/23  SUBJECTIVE:                                                                                                                                                                                      SUBJECTIVE STATEMENT: 62 year old patient s/p reverse total shoulder arthroplasty biceps tenodesis 08/12/23. He says he has been using CPM machine and not much pain upon arrival because he  has not done anything with his shoulder.  Hand dominance: Right  PERTINENT HISTORY: Recent shoulder surgery, Plan is discontinue sling.  Okay to very lightly lift with the operative extremity but no lifting anything heavier than a coffee cup or cell phone.  Start physical therapy to focus on passive range of motion and active range of motion with deltoid isometrics.  Do not want to externally rotate past 30 degrees to protect subscapularis repair.  Follow-up in 4 weeks 09/19/23 for clinical recheck with [name].  PAIN:  Are you having pain? Yes: NPRS scale: 3/10 Pain location: left anterior shoulder Pain description: tight, ache Aggravating factors: reaching too far Relieving factors: resting  PRECAUTIONS: Shoulder  RED FLAGS: None   WEIGHT BEARING RESTRICTIONS: No  FALLS:  Has patient fallen in last 6 months? No  OCCUPATION: Manufactoring, janitorial work  PLOF: Independent  PATIENT GOALS:regain shoulder function  NEXT MD VISIT:   OBJECTIVE:  Note: Objective measures were completed at Evaluation unless otherwise noted.  DIAGNOSTIC FINDINGS:  "AP, scapular Y, axillary views of the shoulder reviewed. Reverse shoulder arthroplasty prosthesis in good position and alignment without any complicating features.  There is no evidence of periprosthetic  fracture, dislocation, dissociation of the glenosphere. "  PATIENT SURVEYS:  Eval: FOTO 68% functional, goal is 79% but admits he didn't know how to answer them, he is post op and so would not be performing these activities so starting score is skewed   COGNITION: Overall cognitive status: Within functional limits for tasks assessed     SENSATION: WFL    UPPER EXTREMITY ROM:   Active ROM PROM Left eval   Shoulder flexion P: 120   Shoulder extension    Shoulder abduction P:110   Shoulder adduction    Shoulder internal rotation P:30   Shoulder external rotation P:30   Elbow flexion    Elbow extension    Wrist flexion    Wrist extension    Wrist ulnar deviation    Wrist radial deviation    Wrist pronation    Wrist supination    (Blank rows = not tested)  UPPER EXTREMITY MMT:  MMT Left Eval    Shoulder flexion 2+   Shoulder extension    Shoulder abduction 2+   Shoulder adduction    Shoulder internal rotation    Shoulder external rotation 3   Middle trapezius    Lower trapezius    Elbow flexion    Elbow extension    Wrist flexion    Wrist extension    Wrist ulnar deviation    Wrist radial deviation    Wrist pronation    Wrist supination    Grip strength (lbs)    (Blank rows = not tested)  PALPATION:  Tender to palpation around incision, no signs of infection at eval   TODAY'S TREATMENT:  Eval HEP creation and review with demonstration and trial set preformed, see below for details    PATIENT EDUCATION: Education details: HEP, PT plan of care Person educated: Patient Education method: Explanation, Demonstration, Verbal cues, and Handouts Education comprehension: verbalized understanding and needs further education   HOME EXERCISE PROGRAM: Access Code: [email or web address] URL: [email or web address] Date: 08/26/2023 Prepared by: Ivery Quale  Exercises - Circular Shoulder Pendulum with Table Support  - 3 x daily - 7 x weekly - 1 sets - 20  reps - Supine Shoulder Flexion Extension AAROM with Dowel  - 3 x daily - 7 x weekly - 1-2 sets - 10 reps - Supine Shoulder  Abduction AAROM with Dowel  - 2 x daily - 6 x weekly - 3 sets - 10 reps - Supine Shoulder Internal Rotation Stretch  - 2 x daily - 6 x weekly - 3 sets - 10 reps - Isometric Shoulder Flexion at Wall  - 2 x daily - 6 x weekly - 1 sets - 10-20 reps - 5 hold - Standing Isometric Shoulder External Rotation with Doorway  - 2 x daily - 6 x weekly - 1 sets - 10-15 reps - 5 hold - Standing Isometric Shoulder Extension with Doorway - Arm Bent  - 2 x daily - 6 x weekly - 1 sets - 10-15 reps - 5 hold - Standing Isometric Shoulder Abduction with Doorway - Arm Bent  - 2 x daily - 6 x weekly - 1 sets - 10-15 reps  ASSESSMENT:  CLINICAL IMPRESSION: Patient referred to PT s/p reverse total shoulder arthroplasty biceps tenodesis 08/12/23. Patient will benefit from skilled PT to address below impairments, limitations and improve overall function.  OBJECTIVE IMPAIRMENTS: decreased activity tolerance, decreased shoulder mobility, decreased ROM, decreased strength, impaired flexibility, impaired UE use, and pain.  ACTIVITY LIMITATIONS: reaching, lifting, carry,  cleaning, driving, and or occupation  PERSONAL FACTORS:DM also affecting patient's functional outcome.  REHAB POTENTIAL: Good  CLINICAL DECISION MAKING: Stable/uncomplicated  EVALUATION COMPLEXITY: Low    GOALS: Short term PT Goals Target date: 09/23/2023   Pt will be I and compliant with HEP. Baseline:  Goal status: New 2. Pt will improve shoulder PROM to [facility]  Goal status: NEW  Long term PT goals Target date:10/21/2023  Pt will improve Lt shoulder AROM to [facility] to improve functional reaching Baseline: Goal status: New Pt will improve  Rt shoulder strength to at least 4+/5 MMT to improve functional strength Baseline: Goal status: New Pt will improve FOTO to at least 79% functional to show improved  function Baseline: Goal status: New Pt will reduce pain to overall less than 3/10 with usual activity and work activity. Baseline: Goal status: New  PLAN: PT FREQUENCY: 1-2 times per week   PT DURATION: 6-8 weeks  PLANNED INTERVENTIONS (unless contraindicated): aquatic PT, Canalith repositioning, cryotherapy, Electrical stimulation, Iontophoresis with 4 mg/ml dexamethasome, Moist heat, traction, Ultrasound, gait training, Therapeutic exercise, balance training, neuromuscular re-education, patient/family education, prosthetic training, manual techniques, passive ROM, dry needling, taping, vasopnuematic device, vestibular, spinal manipulations, joint manipulations  PLAN FOR NEXT SESSION: review HEP MD Plan is discontinue sling.  Okay to very lightly lift with the operative extremity but no lifting anything heavier than a coffee cup or cell phone.  Start physical therapy to focus on passive range of motion and active range of motion with deltoid isometrics.  Do not want to externally rotate past 30 degrees to protect subscapularis repair.  Follow-up in 4 weeks for clinical recheck with [name] 09/19/23   April Manson, PT,DPT 08/26/2023, 3:39 PM

## 2023-08-27 ENCOUNTER — Encounter: Payer: BC Managed Care – PPO | Admitting: Surgical

## 2023-09-02 DIAGNOSIS — M19012 Primary osteoarthritis, left shoulder: Secondary | ICD-10-CM

## 2023-09-02 DIAGNOSIS — M7522 Bicipital tendinitis, left shoulder: Secondary | ICD-10-CM

## 2023-09-03 ENCOUNTER — Ambulatory Visit (INDEPENDENT_AMBULATORY_CARE_PROVIDER_SITE_OTHER): Payer: BC Managed Care – PPO | Admitting: Rehabilitative and Restorative Service Providers"

## 2023-09-03 ENCOUNTER — Encounter: Payer: Self-pay | Admitting: Rehabilitative and Restorative Service Providers"

## 2023-09-03 DIAGNOSIS — M25512 Pain in left shoulder: Secondary | ICD-10-CM | POA: Diagnosis not present

## 2023-09-03 DIAGNOSIS — M25612 Stiffness of left shoulder, not elsewhere classified: Secondary | ICD-10-CM

## 2023-09-03 DIAGNOSIS — R600 Localized edema: Secondary | ICD-10-CM

## 2023-09-03 DIAGNOSIS — M6281 Muscle weakness (generalized): Secondary | ICD-10-CM

## 2023-09-03 NOTE — Therapy (Signed)
OUTPATIENT PHYSICAL THERAPY SHOULDER TREATMENT   Patient Name: Timothy Warren MRN: [id number] DOB:1961-09-29, 62 y.o., male Today's Date: 09/03/2023  END OF SESSION:  PT End of Session - 09/03/23 1303     Visit Number 2    Number of Visits 12    Date for PT Re-Evaluation 10/21/23    PT Start Time 1303    PT Stop Time 1343    PT Time Calculation (min) 40 min    Activity Tolerance Patient tolerated treatment well;No increased pain    Behavior During Therapy [facility] for tasks assessed/performed              Past Medical History:  Diagnosis Date   Allergy    Chronic kidney disease    stage 2 CKD   Diabetes mellitus without complication (HCC)    Hyperlipidemia    Hypertension    Seasonal allergies    Past Surgical History:  Procedure Laterality Date   BICEPT TENODESIS Left 08/12/2023   Procedure: BICEPS TENODESIS;  Surgeon: Cammy Copa, MD;  Location: [facility] OR;  Service: Orthopedics;  Laterality: Left;   COLONOSCOPY     REVERSE SHOULDER ARTHROPLASTY Left 08/12/2023   Procedure: REVERSE SHOULDER ARTHROPLASTY;  Surgeon: Cammy Copa, MD;  Location: [facility] OR;  Service: Orthopedics;  Laterality: Left;   Patient Active Problem List   Diagnosis Date Noted   Biceps tendonitis, left 09/02/2023   Arthritis of left shoulder region 09/02/2023   OA (osteoarthritis) of shoulder 08/12/2023   S/P reverse total shoulder arthroplasty, left 08/12/2023   Onychomycosis 07/30/2022   Chronic gout without tophus 07/30/2022   Primary osteoarthritis, left shoulder 11/13/2021   Chronic right shoulder pain 10/16/2021   Hyperlipidemia due to type 2 diabetes mellitus (HCC) 05/28/2021   Type 2 diabetes mellitus without complication, without long-term current use of insulin (HCC) 08/12/2020   Stage 3a chronic kidney disease (HCC) 08/12/2020   Erectile dysfunction associated with type 2 diabetes mellitus (HCC) 08/12/2020   Hypertension 02/17/2020    PCP: Storm Frisk,  MD   REFERRING PROVIDER: Julieanne Cotton, PA-C   REFERRING DIAG: [phone] (ICD-10-CM) - Primary osteoarthritis, left shoulder  THERAPY DIAG:  Acute pain of left shoulder  Stiffness of left shoulder, not elsewhere classified  Muscle weakness (generalized)  Localized edema  Rationale for Evaluation and Treatment: Rehabilitation  ONSET DATE: DOS 08/12/23  SUBJECTIVE:                                                                                                                                                                                      SUBJECTIVE STATEMENT: Zebulen reports early HEP compliance, although he has some  questions about his exercises.  Sleep is 2-3 hours uninterrupted.  62 year old patient s/p reverse total shoulder arthroplasty biceps tenodesis 08/12/23. He says he has been using CPM machine and not much pain upon arrival because he has not done anything with his shoulder.  Hand dominance: Right  PERTINENT HISTORY: Recent shoulder surgery, Plan is discontinue sling.  Okay to very lightly lift with the operative extremity but no lifting anything heavier than a coffee cup or cell phone.  Start physical therapy to focus on passive range of motion and active range of motion with deltoid isometrics.  Do not want to externally rotate past 30 degrees to protect subscapularis repair.  Follow-up in 4 weeks 09/19/23 for clinical recheck with [name].  PAIN:  Are you having pain? Yes: NPRS scale: 0-3/10 Pain location: left anterior shoulder Pain description: tight, ache Aggravating factors: reaching too far and finding a comfortable position at night Relieving factors: resting  PRECAUTIONS: Shoulder  RED FLAGS: None   WEIGHT BEARING RESTRICTIONS: No  FALLS:  Has patient fallen in last 6 months? No  OCCUPATION: Manufactoring, janitorial work  PLOF: Independent  PATIENT GOALS:regain shoulder function  NEXT MD VISIT:   OBJECTIVE:  Note: Objective measures  were completed at Evaluation unless otherwise noted.  DIAGNOSTIC FINDINGS:  "AP, scapular Y, axillary views of the shoulder reviewed. Reverse shoulder arthroplasty prosthesis in good position and alignment without any complicating features.  There is no evidence of periprosthetic fracture, dislocation, dissociation of the glenosphere. "  PATIENT SURVEYS:  Eval: FOTO 68% functional, goal is 79% but admits he didn't know how to answer them, he is post op and so would not be performing these activities so starting score is skewed   COGNITION: Overall cognitive status: Within functional limits for tasks assessed     SENSATION: WFL    UPPER EXTREMITY ROM:   Active ROM PROM Left eval   Shoulder flexion P: 120   Shoulder extension    Shoulder abduction P:110   Shoulder adduction    Shoulder internal rotation P:30   Shoulder external rotation P:30   Elbow flexion    Elbow extension    Wrist flexion    Wrist extension    Wrist ulnar deviation    Wrist radial deviation    Wrist pronation    Wrist supination    (Blank rows = not tested)  UPPER EXTREMITY MMT:  MMT Left Eval    Shoulder flexion 2+   Shoulder extension    Shoulder abduction 2+   Shoulder adduction    Shoulder internal rotation    Shoulder external rotation 3   Middle trapezius    Lower trapezius    Elbow flexion    Elbow extension    Wrist flexion    Wrist extension    Wrist ulnar deviation    Wrist radial deviation    Wrist pronation    Wrist supination    Grip strength (lbs)    (Blank rows = not tested)  PALPATION:  Tender to palpation around incision, no signs of infection at eval   TODAY'S TREATMENT:  09/03/2023 Codman's pendulums clockwise and counter-clockwise 30 x each, cues to relax shoulder and let body do the motion Shoulder Isometrics (25-50% effort): ER; flexion; extension and abduction 10 x 5 seconds each Supine shoulder flexion with dowel (palms facing in) 10 x 2 seconds Supine  shoulder abduction with dowel (lead with thumb) 10 x 2 seconds Supine shoulder IR 10 x 2 seconds Comprehensive review of all  HEP activities   Eval HEP creation and review with demonstration and trial set preformed, see below for details    PATIENT EDUCATION: Education details: HEP, PT plan of care Person educated: Patient Education method: Explanation, Demonstration, Verbal cues, and Handouts Education comprehension: verbalized understanding and needs further education   HOME EXERCISE PROGRAM: Access Code: [email or web address] URL: [email or web address] Date: 08/26/2023 Prepared by: Ivery Quale  Exercises - Circular Shoulder Pendulum with Table Support  - 3 x daily - 7 x weekly - 1 sets - 20 reps - Supine Shoulder Flexion Extension AAROM with Dowel  - 3 x daily - 7 x weekly - 1-2 sets - 10 reps - Supine Shoulder Abduction AAROM with Dowel  - 2 x daily - 6 x weekly - 3 sets - 10 reps - Supine Shoulder Internal Rotation Stretch  - 2 x daily - 6 x weekly - 3 sets - 10 reps - Isometric Shoulder Flexion at Wall  - 2 x daily - 6 x weekly - 1 sets - 10-20 reps - 5 hold - Standing Isometric Shoulder External Rotation with Doorway  - 2 x daily - 6 x weekly - 1 sets - 10-15 reps - 5 hold - Standing Isometric Shoulder Extension with Doorway - Arm Bent  - 2 x daily - 6 x weekly - 1 sets - 10-15 reps - 5 hold - Standing Isometric Shoulder Abduction with Doorway - Arm Bent  - 2 x daily - 6 x weekly - 1 sets - 10-15 reps  ASSESSMENT: CLINICAL IMPRESSION:  09/03/2023: Damiel is making great progress early with his supervised PT.  He can already raise his surgical arm overhead with minimal difficulty and he is getting 2 to 3 hours of uninterrupted sleep.  We reviewed his day 1 home exercise program and I cautioned him not to use his surgical shoulder to push out of a chair to protect the healing subscapularis.  I also reminded Tamarion to go slow with activities around the house as his recovery will  be good as long as he is not overdoing things early postsurgery.  Based on his current progress and compliance, his prognosis to meet the below listed goals remains good.  Eval: Patient referred to PT s/p reverse total shoulder arthroplasty biceps tenodesis 08/12/23. Patient will benefit from skilled PT to address below impairments, limitations and improve overall function.  OBJECTIVE IMPAIRMENTS: decreased activity tolerance, decreased shoulder mobility, decreased ROM, decreased strength, impaired flexibility, impaired UE use, and pain.  ACTIVITY LIMITATIONS: reaching, lifting, carry,  cleaning, driving, and or occupation  PERSONAL FACTORS:DM also affecting patient's functional outcome.  REHAB POTENTIAL: Good  CLINICAL DECISION MAKING: Stable/uncomplicated  EVALUATION COMPLEXITY: Low    GOALS: Short term PT Goals Target date: 09/23/2023   Pt will be I and compliant with HEP. Baseline:  Goal status: New 2. Pt will improve shoulder PROM to [facility]  Goal status: NEW  Long term PT goals Target date:10/21/2023  Pt will improve Lt shoulder AROM to [facility] to improve functional reaching Baseline: Goal status: New Pt will improve  Rt shoulder strength to at least 4+/5 MMT to improve functional strength Baseline: Goal status: New Pt will improve FOTO to at least 79% functional to show improved function Baseline: Goal status: New Pt will reduce pain to overall less than 3/10 with usual activity and work activity. Baseline: Goal status: New  PLAN: PT FREQUENCY: 1-2 times per week   PT DURATION: 6-8 weeks  PLANNED INTERVENTIONS (unless contraindicated): aquatic PT, Canalith repositioning, cryotherapy,  Electrical stimulation, Iontophoresis with 4 mg/ml dexamethasome, Moist heat, traction, Ultrasound, gait training, Therapeutic exercise, balance training, neuromuscular re-education, patient/family education, prosthetic training, manual techniques, passive ROM, dry needling, taping,  vasopnuematic device, vestibular, spinal manipulations, joint manipulations  PLAN FOR NEXT SESSION: Review HEP.  OK to very lightly lift with the operative extremity but no lifting anything heavier than a coffee cup or cell phone.  Focus on passive range of motion and active range of motion with deltoid isometrics.  Do not want to externally rotate past 30 degrees or weight-bear in extension to protect subscapularis repair.  Sees [name] 09/19/23.   Cherlyn Cushing, PT, MPT 09/03/2023, 5:24 PM

## 2023-09-07 NOTE — Discharge Summary (Signed)
Physician Discharge Summary      Patient ID: Timothy Warren MRN: [id number] DOB/AGE: 1961-04-01 62 y.o.  Admit date: 08/12/2023 Discharge date: 08/13/2023  Admission Diagnoses:  Principal Problem:   OA (osteoarthritis) of shoulder Active Problems:   S/P reverse total shoulder arthroplasty, left   Biceps tendonitis, left   Arthritis of left shoulder region   Discharge Diagnoses:  Same  Surgeries: Procedure(s): REVERSE SHOULDER ARTHROPLASTY BICEPS TENODESIS on 08/12/2023   Consultants:   Discharged Condition: Stable  Hospital Course: [name] is an 62 y.o. male who was admitted 08/12/2023 with a chief complaint of left shoulder arthritis/pain, and found to have a diagnosis of left shoulder arthritis.  They were brought to the operating room on 08/12/2023 and underwent the above named procedures.  Pt awoke from anesthesia without complication and was transferred to the floor. On POD1, patient's pain was controlled.  No red flag signs or symptoms.  Did have bleeding in his Aquacel dressing with removal of the dressing demonstrating small gapping in the superior portion of the incision that required the placement of several nylon sutures on the unit floor under sterile technique.  Tolerated this well and he was discharged home on POD 1 after reevaluation..  Pt will f/u with [name] in clinic in ~2 weeks.   Antibiotics given:  Anti-infectives (From admission, onward)    Start     Dose/Rate Route Frequency Ordered Stop   08/12/23 2200  ceFAZolin (ANCEF) IVPB 2g/100 mL premix  Status:  Discontinued        2 g 200 mL/hr over 30 Minutes Intravenous Every 8 hours 08/12/23 1834 08/13/23 2011   08/12/23 1329  vancomycin (VANCOCIN) powder  Status:  Discontinued          As needed 08/12/23 1329 08/12/23 1707   08/12/23 0945  ceFAZolin (ANCEF) IVPB 2g/100 mL premix        2 g 200 mL/hr over 30 Minutes Intravenous On call to O.R. 08/12/23 0940 08/12/23 1319     .  Recent vital signs:   Vitals:   08/13/23 0500 08/13/23 0748  BP: 119/83 131/84  Pulse: 68 61  Resp: 19 18  Temp: 98 F (36.7 C) 98.3 F (36.8 C)  SpO2: 100% 99%    Recent laboratory studies:  Results for orders placed or performed during the hospital encounter of 08/12/23  Glucose, capillary  Result Value Ref Range   Glucose-Capillary 169 (H) 70 - 99 mg/dL  Glucose, capillary  Result Value Ref Range   Glucose-Capillary 109 (H) 70 - 99 mg/dL  Glucose, capillary  Result Value Ref Range   Glucose-Capillary 204 (H) 70 - 99 mg/dL    Discharge Medications:   Allergies as of 08/13/2023       Reactions   Chlorhexidine Rash        Medication List     TAKE these medications    acetaminophen 500 MG tablet Commonly known as: TYLENOL Take 500-1,000 mg by mouth every 6 (six) hours as needed (pain.).   amLODipine 10 MG tablet Commonly known as: NORVASC Take 1 tablet (10 mg total) by mouth daily. To lower blood pressure   aspirin EC 81 MG tablet Take 1 tablet (81 mg total) by mouth daily. Swallow whole.   atorvastatin 20 MG tablet Commonly known as: LIPITOR Take 1 tablet (20 mg total) by mouth daily.   celecoxib 100 MG capsule Commonly known as: CELEBREX Take 1 capsule (100 mg total) by mouth 2 (two) times daily.  docusate sodium 100 MG capsule Commonly known as: COLACE Take 1 capsule (100 mg total) by mouth 2 (two) times daily.   hydrochlorothiazide 25 MG tablet Commonly known as: HYDRODIURIL Take 1 tablet (25 mg total) by mouth daily.   metFORMIN 500 MG tablet Commonly known as: GLUCOPHAGE Take 1 tablet (500 mg total) by mouth 2 (two) times daily with a meal.   methocarbamol 500 MG tablet Commonly known as: ROBAXIN Take 1 tablet (500 mg total) by mouth every 8 (eight) hours as needed for muscle spasms.   OneTouch Delica Plus Lancet33G Misc Check blood sugar twice a day   OneTouch Verio Reflect w/Device Kit Use to check blood sugar twice a day   OneTouch Verio test  strip Generic drug: glucose blood Use to check blood sugar twice daily.   oxyCODONE 5 MG immediate release tablet Commonly known as: Oxy IR/ROXICODONE Take 1 tablet (5 mg total) by mouth every 4 (four) hours as needed for moderate pain (pain score 4-6).   sildenafil 100 MG tablet Commonly known as: VIAGRA Take 1 tablet (100 mg total) by mouth daily as needed for erectile dysfunction.        Diagnostic Studies: XR Shoulder Left  Result Date: 08/22/2023 AP, scapular Y, axillary views of the shoulder reviewed.  Reverse shoulder arthroplasty prosthesis in good position and alignment without any complicating features.  There is no evidence of periprosthetic fracture, dislocation, dissociation of the glenosphere.   DG Shoulder Left Port  Result Date: 08/12/2023 CLINICAL DATA:  Postop. EXAM: LEFT SHOULDER COMPARISON:  None Available. FINDINGS: Reverse left shoulder arthroplasty in expected alignment. No periprosthetic lucency or fracture. Recent postsurgical change includes air and edema in the joint space and soft tissues. IMPRESSION: Reverse left shoulder arthroplasty without immediate postoperative complication. Electronically Signed   By: Narda Rutherford M.D.   On: 08/12/2023 20:19    Disposition: Discharge disposition: 01-Home or Self Care       Discharge Instructions     Call MD / Call 911   Complete by: As directed    If you experience chest pain or shortness of breath, CALL 911 and be transported to the hospital emergency room.  If you develope a fever above 101 F, pus (white drainage) or increased drainage or redness at the wound, or calf pain, call your surgeon's office.   Constipation Prevention   Complete by: As directed    Drink plenty of fluids.  Prune juice may be helpful.  You may use a stool softener, such as Colace (over the counter) 100 mg twice a day.  Use MiraLax (over the counter) for constipation as needed.   Diet - low sodium heart healthy   Complete by: As  directed    Discharge instructions   Complete by: As directed    You may shower, dressing is waterproof.  Do not bathe or soak the operative shoulder in a tub, pool.  Use the CPM machine 3 times a day for one hour each time.  No lifting with the operative shoulder. Continue use of the sling.  Follow-up on 08/22/2023 for suture removal.  We will remove your adhesive bandage at that time.  Make sure that you ambulate with a cane in the nonoperative arm.  Call with any concerns or if your ability to walk significantly worsens.  Dental Antibiotics:  In most cases prophylactic antibiotics for Dental procdeures after total joint surgery are not necessary.  Exceptions are as follows:  1. History of prior total joint infection  2. Severely immunocompromised (Organ Transplant, cancer chemotherapy, Rheumatoid biologic meds such as Humera)  3. Poorly controlled diabetes (A1C &gt; 8.0, blood glucose over 200)  If you have one of these conditions, contact your surgeon for an antibiotic prescription, prior to your dental procedure.   Increase activity slowly as tolerated   Complete by: As directed    Post-operative opioid taper instructions:   Complete by: As directed    POST-OPERATIVE OPIOID TAPER INSTRUCTIONS: It is important to wean off of your opioid medication as soon as possible. If you do not need pain medication after your surgery it is ok to stop day one. Opioids include: Codeine, Hydrocodone(Norco, Vicodin), Oxycodone(Percocet, oxycontin) and hydromorphone amongst others.  Long term and even short term use of opiods can cause: Increased pain response Dependence Constipation Depression Respiratory depression And more.  Withdrawal symptoms can include Flu like symptoms Nausea, vomiting And more Techniques to manage these symptoms Hydrate well Eat regular healthy meals Stay active Use relaxation techniques(deep breathing, meditating, yoga) Do Not substitute Alcohol to help with  tapering If you have been on opioids for less than two weeks and do not have pain than it is ok to stop all together.  Plan to wean off of opioids This plan should start within one week post op of your joint replacement. Maintain the same interval or time between taking each dose and first decrease the dose.  Cut the total daily intake of opioids by one tablet each day Next start to increase the time between doses. The last dose that should be eliminated is the evening dose.           Follow-up Information     Storm Frisk, MD Follow up.   Specialty: Pulmonary Disease Contact information: [address] [place] [address] [phone]                  Signed: Karenann Cai 09/07/2023, 10:14 AM

## 2023-09-09 ENCOUNTER — Encounter: Payer: Self-pay | Admitting: Physical Therapy

## 2023-09-09 ENCOUNTER — Ambulatory Visit (INDEPENDENT_AMBULATORY_CARE_PROVIDER_SITE_OTHER): Payer: BC Managed Care – PPO | Admitting: Physical Therapy

## 2023-09-09 DIAGNOSIS — M25512 Pain in left shoulder: Secondary | ICD-10-CM

## 2023-09-09 DIAGNOSIS — M25612 Stiffness of left shoulder, not elsewhere classified: Secondary | ICD-10-CM | POA: Diagnosis not present

## 2023-09-09 DIAGNOSIS — R600 Localized edema: Secondary | ICD-10-CM

## 2023-09-09 DIAGNOSIS — M6281 Muscle weakness (generalized): Secondary | ICD-10-CM

## 2023-09-09 NOTE — Therapy (Signed)
OUTPATIENT PHYSICAL THERAPY SHOULDER TREATMENT   Patient Name: Timothy Warren MRN: [id number] DOB:Mar 18, 1961, 62 y.o., male Today's Date: 09/09/2023  END OF SESSION:  PT End of Session - 09/09/23 0952     Visit Number 3    Number of Visits 12    Date for PT Re-Evaluation 10/21/23    PT Start Time 0930    PT Stop Time 1010    PT Time Calculation (min) 40 min    Activity Tolerance Patient tolerated treatment well;No increased pain    Behavior During Therapy [facility] for tasks assessed/performed              Past Medical History:  Diagnosis Date   Allergy    Chronic kidney disease    stage 2 CKD   Diabetes mellitus without complication (HCC)    Hyperlipidemia    Hypertension    Seasonal allergies    Past Surgical History:  Procedure Laterality Date   BICEPT TENODESIS Left 08/12/2023   Procedure: BICEPS TENODESIS;  Surgeon: Cammy Copa, MD;  Location: [facility] OR;  Service: Orthopedics;  Laterality: Left;   COLONOSCOPY     REVERSE SHOULDER ARTHROPLASTY Left 08/12/2023   Procedure: REVERSE SHOULDER ARTHROPLASTY;  Surgeon: Cammy Copa, MD;  Location: [facility] OR;  Service: Orthopedics;  Laterality: Left;   Patient Active Problem List   Diagnosis Date Noted   Biceps tendonitis, left 09/02/2023   Arthritis of left shoulder region 09/02/2023   OA (osteoarthritis) of shoulder 08/12/2023   S/P reverse total shoulder arthroplasty, left 08/12/2023   Onychomycosis 07/30/2022   Chronic gout without tophus 07/30/2022   Primary osteoarthritis, left shoulder 11/13/2021   Chronic right shoulder pain 10/16/2021   Hyperlipidemia due to type 2 diabetes mellitus (HCC) 05/28/2021   Type 2 diabetes mellitus without complication, without long-term current use of insulin (HCC) 08/12/2020   Stage 3a chronic kidney disease (HCC) 08/12/2020   Erectile dysfunction associated with type 2 diabetes mellitus (HCC) 08/12/2020   Hypertension 02/17/2020    PCP: Storm Frisk, MD   REFERRING  PROVIDER: Julieanne Cotton, PA-C   REFERRING DIAG: [phone] (ICD-10-CM) - Primary osteoarthritis, left shoulder  THERAPY DIAG:  Acute pain of left shoulder  Stiffness of left shoulder, not elsewhere classified  Muscle weakness (generalized)  Localized edema  Rationale for Evaluation and Treatment: Rehabilitation  ONSET DATE: DOS 08/12/23  SUBJECTIVE:                                                                                                                                                                                      SUBJECTIVE STATEMENT: Timothy Warren reports his shoulder is coming along good, no  pain to report  62 year old patient s/p reverse total shoulder arthroplasty biceps tenodesis 08/12/23. He says he has been using CPM machine and not much pain upon arrival because he has not done anything with his shoulder.  Hand dominance: Right  PERTINENT HISTORY: Recent shoulder surgery, Plan is discontinue sling.  Okay to very lightly lift with the operative extremity but no lifting anything heavier than a coffee cup or cell phone.  Start physical therapy to focus on passive range of motion and active range of motion with deltoid isometrics.  Do not want to externally rotate past 30 degrees to protect subscapularis repair.  Follow-up in 4 weeks 09/19/23 for clinical recheck with [name].  PAIN:  Are you having pain? Yes: NPRS scale: 0/10 Pain location: left anterior shoulder Pain description: tight, ache Aggravating factors: reaching too far and finding a comfortable position at night Relieving factors: resting  PRECAUTIONS: Shoulder  RED FLAGS: None   WEIGHT BEARING RESTRICTIONS: No  FALLS:  Has patient fallen in last 6 months? No  OCCUPATION: Manufactoring, janitorial work  PLOF: Independent  PATIENT GOALS:regain shoulder function  NEXT MD VISIT:   OBJECTIVE:  Note: Objective measures were completed at Evaluation unless otherwise noted.  DIAGNOSTIC FINDINGS:   "AP, scapular Y, axillary views of the shoulder reviewed. Reverse shoulder arthroplasty prosthesis in good position and alignment without any complicating features.  There is no evidence of periprosthetic fracture, dislocation, dissociation of the glenosphere. "  PATIENT SURVEYS:  Eval: FOTO 68% functional, goal is 79% but admits he didn't know how to answer them, he is post op and so would not be performing these activities so starting score is skewed   COGNITION: Overall cognitive status: Within functional limits for tasks assessed     SENSATION: WFL    UPPER EXTREMITY ROM:   Active ROM PROM Left eval   Shoulder flexion P: 120   Shoulder extension    Shoulder abduction P:110   Shoulder adduction    Shoulder internal rotation P:30   Shoulder external rotation P:30   Elbow flexion    Elbow extension    Wrist flexion    Wrist extension    Wrist ulnar deviation    Wrist radial deviation    Wrist pronation    Wrist supination    (Blank rows = not tested)  UPPER EXTREMITY MMT:  MMT Left Eval  Left 09/09/23  Shoulder flexion 2+ 4  Shoulder extension    Shoulder abduction 2+ 4  Shoulder adduction    Shoulder internal rotation  Not tested due to post op precautions  Shoulder external rotation 3 4  Middle trapezius    Lower trapezius    Elbow flexion    Elbow extension    Wrist flexion    Wrist extension    Wrist ulnar deviation    Wrist radial deviation    Wrist pronation    Wrist supination    Grip strength (lbs)    (Blank rows = not tested)  PALPATION:  Tender to palpation around incision, no signs of infection at eval   TODAY'S TREATMENT:  09/09/2023 UBE L2 for 5 min switching directions halfway Pulleys 2 min flexion, 2 min abduction UE ranger AAROM X 10 flexion, abduction, and circles CW and CCW in flexion,  P ball roll up wall into shoulder flexion, 5 sec X 10 Rows green 2X15 Shoulder extensions green 2X10 Shoulder ER to 30 deg red 2X10 Shoulder  abd AAROM 1# bar X10 Shoulder flexion AAROM 1# bar  2X10 Shoulder Isometrics; flexion; and abduction 10 x 5 seconds each Codman's pendulums clockwise and counter-clockwise 20 x each,   09/03/2023 Codman's pendulums clockwise and counter-clockwise 30 x each, cues to relax shoulder and let body do the motion Shoulder Isometrics (25-50% effort): ER; flexion; extension and abduction 10 x 5 seconds each Supine shoulder flexion with dowel (palms facing in) 10 x 2 seconds Supine shoulder abduction with dowel (lead with thumb) 10 x 2 seconds Supine shoulder IR 10 x 2 seconds Comprehensive review of all HEP activities   Eval HEP creation and review with demonstration and trial set preformed, see below for details    PATIENT EDUCATION: Education details: HEP, PT plan of care Person educated: Patient Education method: Explanation, Demonstration, Verbal cues, and Handouts Education comprehension: verbalized understanding and needs further education   HOME EXERCISE PROGRAM: Access Code: [email or web address] URL: [email or web address] Date: 08/26/2023 Prepared by: Ivery Quale  Exercises - Circular Shoulder Pendulum with Table Support  - 3 x daily - 7 x weekly - 1 sets - 20 reps - Supine Shoulder Flexion Extension AAROM with Dowel  - 3 x daily - 7 x weekly - 1-2 sets - 10 reps - Supine Shoulder Abduction AAROM with Dowel  - 2 x daily - 6 x weekly - 3 sets - 10 reps - Supine Shoulder Internal Rotation Stretch  - 2 x daily - 6 x weekly - 3 sets - 10 reps - Isometric Shoulder Flexion at Wall  - 2 x daily - 6 x weekly - 1 sets - 10-20 reps - 5 hold - Standing Isometric Shoulder External Rotation with Doorway  - 2 x daily - 6 x weekly - 1 sets - 10-15 reps - 5 hold - Standing Isometric Shoulder Extension with Doorway - Arm Bent  - 2 x daily - 6 x weekly - 1 sets - 10-15 reps - 5 hold - Standing Isometric Shoulder Abduction with Doorway - Arm Bent  - 2 x daily - 6 x weekly - 1 sets - 10-15  reps  ASSESSMENT: CLINICAL IMPRESSION:  09/09/2023:  He continues to do very well post op. He showed good strength improvement since Eval. Based on his current progress and compliance, his prognosis to meet the below listed goals remains good.  Eval: Patient referred to PT s/p reverse total shoulder arthroplasty biceps tenodesis 08/12/23. Patient will benefit from skilled PT to address below impairments, limitations and improve overall function.  OBJECTIVE IMPAIRMENTS: decreased activity tolerance, decreased shoulder mobility, decreased ROM, decreased strength, impaired flexibility, impaired UE use, and pain.  ACTIVITY LIMITATIONS: reaching, lifting, carry,  cleaning, driving, and or occupation  PERSONAL FACTORS:DM also affecting patient's functional outcome.  REHAB POTENTIAL: Good  CLINICAL DECISION MAKING: Stable/uncomplicated  EVALUATION COMPLEXITY: Low    GOALS: Short term PT Goals Target date: 09/23/2023   Pt will be I and compliant with HEP. Baseline:  Goal status: New 2. Pt will improve shoulder PROM to [facility]  Goal status: NEW  Long term PT goals Target date:10/21/2023  Pt will improve Lt shoulder AROM to [facility] to improve functional reaching Baseline: Goal status: New Pt will improve  Rt shoulder strength to at least 4+/5 MMT to improve functional strength Baseline: Goal status: New Pt will improve FOTO to at least 79% functional to show improved function Baseline: Goal status: New Pt will reduce pain to overall less than 3/10 with usual activity and work activity. Baseline: Goal status: New  PLAN: PT FREQUENCY: 1-2 times per week   PT DURATION:  6-8 weeks  PLANNED INTERVENTIONS (unless contraindicated): aquatic PT, Canalith repositioning, cryotherapy, Electrical stimulation, Iontophoresis with 4 mg/ml dexamethasome, Moist heat, traction, Ultrasound, gait training, Therapeutic exercise, balance training, neuromuscular re-education, patient/family education,  prosthetic training, manual techniques, passive ROM, dry needling, taping, vasopnuematic device, vestibular, spinal manipulations, joint manipulations  PLAN FOR NEXT SESSION: update HEP.  OK to very lightly lift with the operative extremity but no lifting anything heavier than a coffee cup or cell phone.  Focus on passive range of motion and active range of motion with deltoid isometrics.  Do not want to externally rotate past 30 degrees or weight-bear in extension to protect subscapularis repair.  Sees [name] 09/19/23.  Ivery Quale, PT, DPT 09/09/23 9:55 AM

## 2023-09-11 ENCOUNTER — Ambulatory Visit (INDEPENDENT_AMBULATORY_CARE_PROVIDER_SITE_OTHER): Payer: BC Managed Care – PPO | Admitting: Physical Therapy

## 2023-09-11 ENCOUNTER — Encounter: Payer: Self-pay | Admitting: Physical Therapy

## 2023-09-11 DIAGNOSIS — M25512 Pain in left shoulder: Secondary | ICD-10-CM

## 2023-09-11 DIAGNOSIS — M6281 Muscle weakness (generalized): Secondary | ICD-10-CM | POA: Diagnosis not present

## 2023-09-11 DIAGNOSIS — M25612 Stiffness of left shoulder, not elsewhere classified: Secondary | ICD-10-CM | POA: Diagnosis not present

## 2023-09-11 DIAGNOSIS — R600 Localized edema: Secondary | ICD-10-CM | POA: Diagnosis not present

## 2023-09-11 NOTE — Therapy (Signed)
OUTPATIENT PHYSICAL THERAPY SHOULDER TREATMENT   Patient Name: Timothy Warren MRN: [id number] DOB:November 26, 1960, 62 y.o., male Today's Date: 09/11/2023  END OF SESSION:  PT End of Session - 09/11/23 0934     Visit Number 4    Number of Visits 12    Date for PT Re-Evaluation 10/21/23    PT Start Time 0925    PT Stop Time 1003    PT Time Calculation (min) 38 min    Activity Tolerance Patient tolerated treatment well;No increased pain    Behavior During Therapy [facility] for tasks assessed/performed              Past Medical History:  Diagnosis Date   Allergy    Chronic kidney disease    stage 2 CKD   Diabetes mellitus without complication (HCC)    Hyperlipidemia    Hypertension    Seasonal allergies    Past Surgical History:  Procedure Laterality Date   BICEPT TENODESIS Left 08/12/2023   Procedure: BICEPS TENODESIS;  Surgeon: Cammy Copa, MD;  Location: [facility] OR;  Service: Orthopedics;  Laterality: Left;   COLONOSCOPY     REVERSE SHOULDER ARTHROPLASTY Left 08/12/2023   Procedure: REVERSE SHOULDER ARTHROPLASTY;  Surgeon: Cammy Copa, MD;  Location: [facility] OR;  Service: Orthopedics;  Laterality: Left;   Patient Active Problem List   Diagnosis Date Noted   Biceps tendonitis, left 09/02/2023   Arthritis of left shoulder region 09/02/2023   OA (osteoarthritis) of shoulder 08/12/2023   S/P reverse total shoulder arthroplasty, left 08/12/2023   Onychomycosis 07/30/2022   Chronic gout without tophus 07/30/2022   Primary osteoarthritis, left shoulder 11/13/2021   Chronic right shoulder pain 10/16/2021   Hyperlipidemia due to type 2 diabetes mellitus (HCC) 05/28/2021   Type 2 diabetes mellitus without complication, without long-term current use of insulin (HCC) 08/12/2020   Stage 3a chronic kidney disease (HCC) 08/12/2020   Erectile dysfunction associated with type 2 diabetes mellitus (HCC) 08/12/2020   Hypertension 02/17/2020    PCP: Storm Frisk, MD   REFERRING  PROVIDER: Julieanne Cotton, PA-C   REFERRING DIAG: [phone] (ICD-10-CM) - Primary osteoarthritis, left shoulder  THERAPY DIAG:  Acute pain of left shoulder  Stiffness of left shoulder, not elsewhere classified  Muscle weakness (generalized)  Localized edema  Rationale for Evaluation and Treatment: Rehabilitation  ONSET DATE: DOS 08/12/23 s/p Lt reverse total shoulder arthroplasty biceps tenodesis   SUBJECTIVE:                                                                                                                                                                                      SUBJECTIVE STATEMENT:  No pain upon arrival  Hand dominance: Right  PERTINENT HISTORY: Recent shoulder surgery, Plan is discontinue sling.  Okay to very lightly lift with the operative extremity but no lifting anything heavier than a coffee cup or cell phone.  Start physical therapy to focus on passive range of motion and active range of motion with deltoid isometrics.  Do not want to externally rotate past 30 degrees to protect subscapularis repair.  Follow-up in 4 weeks 09/19/23 for clinical recheck with [name].  PAIN:  Are you having pain? Yes: NPRS scale: 0/10 Pain location: left anterior shoulder Pain description: tight, ache Aggravating factors: reaching too far and finding a comfortable position at night Relieving factors: resting  PRECAUTIONS: Shoulder  RED FLAGS: None   WEIGHT BEARING RESTRICTIONS: No  FALLS:  Has patient fallen in last 6 months? No  OCCUPATION: Manufactoring, janitorial work  PLOF: Independent  PATIENT GOALS:regain shoulder function  NEXT MD VISIT:   OBJECTIVE:  Note: Objective measures were completed at Evaluation unless otherwise noted.  DIAGNOSTIC FINDINGS:  "AP, scapular Y, axillary views of the shoulder reviewed. Reverse shoulder arthroplasty prosthesis in good position and alignment without any complicating features.  There is no evidence of  periprosthetic fracture, dislocation, dissociation of the glenosphere. "  PATIENT SURVEYS:  Eval: FOTO 68% functional, goal is 79% but admits he didn't know how to answer them, he is post op and so would not be performing these activities so starting score is skewed   COGNITION: Overall cognitive status: Within functional limits for tasks assessed     SENSATION: WFL    UPPER EXTREMITY ROM:   Active ROM PROM Left eval   Shoulder flexion P: 120   Shoulder extension    Shoulder abduction P:110   Shoulder adduction    Shoulder internal rotation P:30   Shoulder external rotation P:30   Elbow flexion    Elbow extension    Wrist flexion    Wrist extension    Wrist ulnar deviation    Wrist radial deviation    Wrist pronation    Wrist supination    (Blank rows = not tested)  UPPER EXTREMITY MMT:  MMT Left Eval  Left 09/09/23  Shoulder flexion 2+ 4  Shoulder extension    Shoulder abduction 2+ 4  Shoulder adduction    Shoulder internal rotation  Not tested due to post op precautions  Shoulder external rotation 3 4  Middle trapezius    Lower trapezius    Elbow flexion    Elbow extension    Wrist flexion    Wrist extension    Wrist ulnar deviation    Wrist radial deviation    Wrist pronation    Wrist supination    Grip strength (lbs)    (Blank rows = not tested)  PALPATION:  Tender to palpation around incision, no signs of infection at eval   TODAY'S TREATMENT:  09/11/2023 UBE L2 for 5 min switching directions halfway Pulleys 2 min flexion, 2 min abduction UE ranger AAROM X 10 flexion, abduction, and circles CW and CCW in flexion,  P ball roll up wall into shoulder flexion, 5 sec X 10 Rows green 2X15 Shoulder extensions green 2X15 Shoulder ER to 30 deg green 3X10 Shoulder abd AAROM 1# bar 2X10 Shoulder flexion AAROM 1# bar 2X10 Shoulder Isometrics; flexion; and abduction 10 x 5 seconds each   09/09/2023 UBE L2 for 5 min switching directions  halfway Pulleys 2 min flexion, 2 min abduction UE ranger AAROM X  10 flexion, abduction, and circles CW and CCW in flexion,  P ball roll up wall into shoulder flexion, 5 sec X 10 Rows green 2X15 Shoulder extensions green 2X10 Shoulder ER to 30 deg red 2X10 Shoulder abd AAROM 1# bar X10 Shoulder flexion AAROM 1# bar 2X10 Shoulder Isometrics; flexion; and abduction 10 x 5 seconds each Codman's pendulums clockwise and counter-clockwise 20 x each,     PATIENT EDUCATION: Education details: HEP, PT plan of care Person educated: Patient Education method: Explanation, Demonstration, Verbal cues, and Handouts Education comprehension: verbalized understanding and needs further education   HOME EXERCISE PROGRAM: Access Code: [email or web address] URL: [email or web address] Date: 09/11/2023 Prepared by: Ivery Quale  Exercises - Circular Shoulder Pendulum with Table Support  - 3 x daily - 7 x weekly - 1 sets - 20 reps - Supine Shoulder Internal Rotation Stretch  - 2 x daily - 6 x weekly - 3 sets - 10 reps - Isometric Shoulder Flexion at Wall  - 2 x daily - 6 x weekly - 1 sets - 10-20 reps - 5 hold - Standing Isometric Shoulder External Rotation with Doorway  - 2 x daily - 6 x weekly - 1 sets - 10-15 reps - 5 hold - Standing Isometric Shoulder Extension with Doorway - Arm Bent  - 2 x daily - 6 x weekly - 1 sets - 10-15 reps - 5 hold - Standing Isometric Shoulder Abduction with Doorway - Arm Bent  - 2 x daily - 6 x weekly - 1 sets - 10-15 reps - Standing Row with Anchored Resistance  - 2 x daily - 6 x weekly - 2-3 sets - 10-15 reps - Shoulder extension with resistance - Neutral  - 2 x daily - 6 x weekly - 2-3 sets - 10 reps - Shoulder External Rotation with Anchored Resistance  - 2 x daily - 6 x weekly - 2-3 sets - 10 reps - Standing Shoulder Flexion AAROM with Dowel  - 2 x daily - 6 x weekly - 2 sets - 10 reps  ASSESSMENT: CLINICAL IMPRESSION:  09/11/2023:  He had good tolerance to  strength progressions today and I updated his HEP to reflect his progress.   Eval: Patient referred to PT s/p reverse total shoulder arthroplasty biceps tenodesis 08/12/23. Patient will benefit from skilled PT to address below impairments, limitations and improve overall function.  OBJECTIVE IMPAIRMENTS: decreased activity tolerance, decreased shoulder mobility, decreased ROM, decreased strength, impaired flexibility, impaired UE use, and pain.  ACTIVITY LIMITATIONS: reaching, lifting, carry,  cleaning, driving, and or occupation  PERSONAL FACTORS:DM also affecting patient's functional outcome.  REHAB POTENTIAL: Good  CLINICAL DECISION MAKING: Stable/uncomplicated  EVALUATION COMPLEXITY: Low    GOALS: Short term PT Goals Target date: 09/23/2023   Pt will be I and compliant with HEP. Baseline:  Goal status: New 2. Pt will improve shoulder PROM to [facility]  Goal status: NEW  Long term PT goals Target date:10/21/2023  Pt will improve Lt shoulder AROM to [facility] to improve functional reaching Baseline: Goal status: New Pt will improve  Rt shoulder strength to at least 4+/5 MMT to improve functional strength Baseline: Goal status: New Pt will improve FOTO to at least 79% functional to show improved function Baseline: Goal status: New Pt will reduce pain to overall less than 3/10 with usual activity and work activity. Baseline: Goal status: New  PLAN: PT FREQUENCY: 1-2 times per week   PT DURATION: 6-8 weeks  PLANNED INTERVENTIONS (unless contraindicated): aquatic PT, Canalith repositioning, cryotherapy,  Electrical stimulation, Iontophoresis with 4 mg/ml dexamethasome, Moist heat, traction, Ultrasound, gait training, Therapeutic exercise, balance training, neuromuscular re-education, patient/family education, prosthetic training, manual techniques, passive ROM, dry needling, taping, vasopnuematic device, vestibular, spinal manipulations, joint manipulations  PLAN FOR NEXT SESSION:  OK to very lightly lift with the operative extremity but no lifting anything heavier than a coffee cup or cell phone.  Focus on passive range of motion and active range of motion with deltoid isometrics.  Do not want to externally rotate past 30 degrees or weight-bear in extension to protect subscapularis repair.  Sees [name] 09/19/23.  Ivery Quale, PT, DPT 09/11/23 10:05 AM

## 2023-09-16 ENCOUNTER — Encounter: Payer: Self-pay | Admitting: Physical Therapy

## 2023-09-16 ENCOUNTER — Ambulatory Visit (INDEPENDENT_AMBULATORY_CARE_PROVIDER_SITE_OTHER): Payer: BC Managed Care – PPO | Admitting: Physical Therapy

## 2023-09-16 DIAGNOSIS — M25512 Pain in left shoulder: Secondary | ICD-10-CM

## 2023-09-16 DIAGNOSIS — M25612 Stiffness of left shoulder, not elsewhere classified: Secondary | ICD-10-CM

## 2023-09-16 DIAGNOSIS — M6281 Muscle weakness (generalized): Secondary | ICD-10-CM | POA: Diagnosis not present

## 2023-09-16 DIAGNOSIS — R600 Localized edema: Secondary | ICD-10-CM | POA: Diagnosis not present

## 2023-09-16 NOTE — Therapy (Signed)
OUTPATIENT PHYSICAL THERAPY SHOULDER TREATMENT   Patient Name: Timothy Warren MRN: [id number] DOB:04-Oct-1961, 62 y.o., male Today's Date: 09/16/2023  END OF SESSION:  PT End of Session - 09/16/23 0946     Visit Number 5    Number of Visits 12    Date for PT Re-Evaluation 10/21/23    PT Start Time 0930    PT Stop Time 1010    PT Time Calculation (min) 40 min    Activity Tolerance Patient tolerated treatment well;No increased pain    Behavior During Therapy [facility] for tasks assessed/performed               Past Medical History:  Diagnosis Date   Allergy    Chronic kidney disease    stage 2 CKD   Diabetes mellitus without complication (HCC)    Hyperlipidemia    Hypertension    Seasonal allergies    Past Surgical History:  Procedure Laterality Date   BICEPT TENODESIS Left 08/12/2023   Procedure: BICEPS TENODESIS;  Surgeon: Cammy Copa, MD;  Location: [facility] OR;  Service: Orthopedics;  Laterality: Left;   COLONOSCOPY     REVERSE SHOULDER ARTHROPLASTY Left 08/12/2023   Procedure: REVERSE SHOULDER ARTHROPLASTY;  Surgeon: Cammy Copa, MD;  Location: [facility] OR;  Service: Orthopedics;  Laterality: Left;   Patient Active Problem List   Diagnosis Date Noted   Biceps tendonitis, left 09/02/2023   Arthritis of left shoulder region 09/02/2023   OA (osteoarthritis) of shoulder 08/12/2023   S/P reverse total shoulder arthroplasty, left 08/12/2023   Onychomycosis 07/30/2022   Chronic gout without tophus 07/30/2022   Primary osteoarthritis, left shoulder 11/13/2021   Chronic right shoulder pain 10/16/2021   Hyperlipidemia due to type 2 diabetes mellitus (HCC) 05/28/2021   Type 2 diabetes mellitus without complication, without long-term current use of insulin (HCC) 08/12/2020   Stage 3a chronic kidney disease (HCC) 08/12/2020   Erectile dysfunction associated with type 2 diabetes mellitus (HCC) 08/12/2020   Hypertension 02/17/2020    PCP: Storm Frisk,  MD   REFERRING PROVIDER: Julieanne Cotton, PA-C   REFERRING DIAG: [phone] (ICD-10-CM) - Primary osteoarthritis, left shoulder  THERAPY DIAG:  Acute pain of left shoulder  Stiffness of left shoulder, not elsewhere classified  Muscle weakness (generalized)  Localized edema  Rationale for Evaluation and Treatment: Rehabilitation  ONSET DATE: DOS 08/12/23 s/p Lt reverse total shoulder arthroplasty biceps tenodesis   SUBJECTIVE:                                                                                                                                                                                      SUBJECTIVE  STATEMENT: Pt reporting when he sleeps on his shoulder it causes pain 3/10 pain. Pt reporting no pain at rest today.   Hand dominance: Right  PERTINENT HISTORY: Recent shoulder surgery, Plan is discontinue sling.  Okay to very lightly lift with the operative extremity but no lifting anything heavier than a coffee cup or cell phone.  Start physical therapy to focus on passive range of motion and active range of motion with deltoid isometrics.  Do not want to externally rotate past 30 degrees to protect subscapularis repair.  Follow-up in 4 weeks 09/19/23 for clinical recheck with [name].  PAIN:  Are you having pain? Yes: NPRS scale: 0/10 Pain location: left anterior shoulder Pain description: tight, ache Aggravating factors: reaching too far and finding a comfortable position at night Relieving factors: resting  PRECAUTIONS: Shoulder  RED FLAGS: None   WEIGHT BEARING RESTRICTIONS: No  FALLS:  Has patient fallen in last 6 months? No  OCCUPATION: Manufactoring, janitorial work  PLOF: Independent  PATIENT GOALS:regain shoulder function  NEXT MD VISIT:   OBJECTIVE:  Note: Objective measures were completed at Evaluation unless otherwise noted.  DIAGNOSTIC FINDINGS:  "AP, scapular Y, axillary views of the shoulder reviewed. Reverse shoulder  arthroplasty prosthesis in good position and alignment without any complicating features.  There is no evidence of periprosthetic fracture, dislocation, dissociation of the glenosphere. "  PATIENT SURVEYS:  Eval: FOTO 68% functional, goal is 79% but admits he didn't know how to answer them, he is post op and so would not be performing these activities so starting score is skewed   COGNITION: Overall cognitive status: Within functional limits for tasks assessed     SENSATION: [facility]    UPPER EXTREMITY ROM:   Active ROM PROM Left eval Left 09/16/23  Shoulder flexion P: 120 P; 134  Shoulder extension    Shoulder abduction P:110 P; 130  Shoulder adduction    Shoulder internal rotation P:30   Shoulder external rotation P:30   Elbow flexion    Elbow extension    Wrist flexion    Wrist extension    Wrist ulnar deviation    Wrist radial deviation    Wrist pronation    Wrist supination    (Blank rows = not tested)  UPPER EXTREMITY MMT:  MMT Left Eval  Left 09/09/23  Shoulder flexion 2+ 4  Shoulder extension    Shoulder abduction 2+ 4  Shoulder adduction    Shoulder internal rotation  Not tested due to post op precautions  Shoulder external rotation 3 4  Middle trapezius    Lower trapezius    Elbow flexion    Elbow extension    Wrist flexion    Wrist extension    Wrist ulnar deviation    Wrist radial deviation    Wrist pronation    Wrist supination    Grip strength (lbs)    (Blank rows = not tested)  PALPATION:  Tender to palpation around incision, no signs of infection at eval   TODAY'S TREATMENT:  09/16/23:  TherEx:  Pulleys: flexion x 3 minutes, abduction x 3 minutes UBE: 3 minutes each direction, Level 2 Rows: green TB 2 x 15 holding 3 sec Shoulder extensions: green TB 2 x 15 Shoulder ER elbow at 90 deg, walk outs x 15  AAROM: shoulder abd x 15  AAROM shoulder flexion c 1# bar 2 x 10  Shoulder isometrics: flexion, extension and abduction x 10 holding 5  sec    09/11/2023 UBE L2  for 5 min switching directions halfway Pulleys 2 min flexion, 2 min abduction UE ranger AAROM X 10 flexion, abduction, and circles CW and CCW in flexion,  P ball roll up wall into shoulder flexion, 5 sec X 10 Rows green 2X15 Shoulder extensions green 2X15 Shoulder ER to 30 deg green 3X10 Shoulder abd AAROM 1# bar 2X10 Shoulder flexion AAROM 1# bar 2X10 Shoulder Isometrics; flexion; and abduction 10 x 5 seconds each   09/09/2023 UBE L2 for 5 min switching directions halfway Pulleys 2 min flexion, 2 min abduction UE ranger AAROM X 10 flexion, abduction, and circles CW and CCW in flexion,  P ball roll up wall into shoulder flexion, 5 sec X 10 Rows green 2X15 Shoulder extensions green 2X10 Shoulder ER to 30 deg red 2X10 Shoulder abd AAROM 1# bar X10 Shoulder flexion AAROM 1# bar 2X10 Shoulder Isometrics; flexion; and abduction 10 x 5 seconds each Codman's pendulums clockwise and counter-clockwise 20 x each,     PATIENT EDUCATION: Education details: HEP, PT plan of care Person educated: Patient Education method: Explanation, Demonstration, Verbal cues, and Handouts Education comprehension: verbalized understanding and needs further education   HOME EXERCISE PROGRAM: Access Code: [email or web address] URL: [email or web address] Date: 09/11/2023 Prepared by: Ivery Quale  Exercises - Circular Shoulder Pendulum with Table Support  - 3 x daily - 7 x weekly - 1 sets - 20 reps - Supine Shoulder Internal Rotation Stretch  - 2 x daily - 6 x weekly - 3 sets - 10 reps - Isometric Shoulder Flexion at Wall  - 2 x daily - 6 x weekly - 1 sets - 10-20 reps - 5 hold - Standing Isometric Shoulder External Rotation with Doorway  - 2 x daily - 6 x weekly - 1 sets - 10-15 reps - 5 hold - Standing Isometric Shoulder Extension with Doorway - Arm Bent  - 2 x daily - 6 x weekly - 1 sets - 10-15 reps - 5 hold - Standing Isometric Shoulder Abduction with Doorway - Arm Bent   - 2 x daily - 6 x weekly - 1 sets - 10-15 reps - Standing Row with Anchored Resistance  - 2 x daily - 6 x weekly - 2-3 sets - 10-15 reps - Shoulder extension with resistance - Neutral  - 2 x daily - 6 x weekly - 2-3 sets - 10 reps - Shoulder External Rotation with Anchored Resistance  - 2 x daily - 6 x weekly - 2-3 sets - 10 reps - Standing Shoulder Flexion AAROM with Dowel  - 2 x daily - 6 x weekly - 2 sets - 10 reps  ASSESSMENT: CLINICAL IMPRESSION:  09/16/2023: Pt arriving with no pain at rest. Pt still reporting pain of 3-4/10 with certain positions and when he wakes up after sleeping on his shoulder. Pt tolerating beginning strengthening exercises well again this visit with no complaints. Pt with improvements in flexion and abducitoin this visit.  Pt has a follow up with [name] on 09/19/23 until then continue with recommended precautions.  Recommend continue skilled PT interventions.      Eval: Patient referred to PT s/p reverse total shoulder arthroplasty biceps tenodesis 08/12/23. Patient will benefit from skilled PT to address below impairments, limitations and improve overall function.  OBJECTIVE IMPAIRMENTS: decreased activity tolerance, decreased shoulder mobility, decreased ROM, decreased strength, impaired flexibility, impaired UE use, and pain.  ACTIVITY LIMITATIONS: reaching, lifting, carry,  cleaning, driving, and or occupation  PERSONAL FACTORS:DM also affecting patient's functional outcome.  REHAB  POTENTIAL: Good  CLINICAL DECISION MAKING: Stable/uncomplicated  EVALUATION COMPLEXITY: Low    GOALS: Short term PT Goals Target date: 09/23/2023   Pt will be I and compliant with HEP. Baseline:  Goal status: MET 09/16/23 2. Pt will improve shoulder PROM to [facility]  Goal status: on-going 09/16/23  Long term PT goals Target date:10/21/2023  Pt will improve Lt shoulder AROM to [facility] to improve functional reaching Baseline: Goal status: New Pt will improve  Rt shoulder  strength to at least 4+/5 MMT to improve functional strength Baseline: Goal status: New Pt will improve FOTO to at least 79% functional to show improved function Baseline: Goal status: New Pt will reduce pain to overall less than 3/10 with usual activity and work activity. Baseline: Goal status: New  PLAN: PT FREQUENCY: 1-2 times per week   PT DURATION: 6-8 weeks  PLANNED INTERVENTIONS (unless contraindicated): aquatic PT, Canalith repositioning, cryotherapy, Electrical stimulation, Iontophoresis with 4 mg/ml dexamethasome, Moist heat, traction, Ultrasound, gait training, Therapeutic exercise, balance training, neuromuscular re-education, patient/family education, prosthetic training, manual techniques, passive ROM, dry needling, taping, vasopnuematic device, vestibular, spinal manipulations, joint manipulations  PLAN FOR NEXT SESSION: OK to very lightly lift with the operative extremity but no lifting anything heavier than a coffee cup or cell phone.  Focus on passive range of motion and active range of motion with deltoid isometrics.  Do not want to externally rotate past 30 degrees or weight-bear in extension to protect subscapularis repair.  Sees [name] 09/19/23.   Route next  note to Dr. Lady Saucier, PT, MPT 09/16/23 9:55 AM   09/16/23 9:55 AM

## 2023-09-18 ENCOUNTER — Ambulatory Visit (INDEPENDENT_AMBULATORY_CARE_PROVIDER_SITE_OTHER): Payer: BC Managed Care – PPO | Admitting: Physical Therapy

## 2023-09-18 ENCOUNTER — Encounter: Payer: Self-pay | Admitting: Physical Therapy

## 2023-09-18 DIAGNOSIS — M25512 Pain in left shoulder: Secondary | ICD-10-CM | POA: Diagnosis not present

## 2023-09-18 DIAGNOSIS — M25612 Stiffness of left shoulder, not elsewhere classified: Secondary | ICD-10-CM

## 2023-09-18 DIAGNOSIS — M6281 Muscle weakness (generalized): Secondary | ICD-10-CM

## 2023-09-18 DIAGNOSIS — R600 Localized edema: Secondary | ICD-10-CM

## 2023-09-18 NOTE — Therapy (Signed)
OUTPATIENT PHYSICAL THERAPY SHOULDER TREATMENT   Patient Name: Timothy Warren MRN: [id number] DOB:09-08-1961, 62 y.o., male Today's Date: 09/18/2023  END OF SESSION:  PT End of Session - 09/18/23 0932     Visit Number 6    Number of Visits 12    Date for PT Re-Evaluation 10/21/23    PT Start Time 0925    PT Stop Time 1003    PT Time Calculation (min) 38 min    Activity Tolerance Patient tolerated treatment well;No increased pain    Behavior During Therapy [facility] for tasks assessed/performed                Past Medical History:  Diagnosis Date   Allergy    Chronic kidney disease    stage 2 CKD   Diabetes mellitus without complication (HCC)    Hyperlipidemia    Hypertension    Seasonal allergies    Past Surgical History:  Procedure Laterality Date   BICEPT TENODESIS Left 08/12/2023   Procedure: BICEPS TENODESIS;  Surgeon: Cammy Copa, MD;  Location: [facility] OR;  Service: Orthopedics;  Laterality: Left;   COLONOSCOPY     REVERSE SHOULDER ARTHROPLASTY Left 08/12/2023   Procedure: REVERSE SHOULDER ARTHROPLASTY;  Surgeon: Cammy Copa, MD;  Location: [facility] OR;  Service: Orthopedics;  Laterality: Left;   Patient Active Problem List   Diagnosis Date Noted   Biceps tendonitis, left 09/02/2023   Arthritis of left shoulder region 09/02/2023   OA (osteoarthritis) of shoulder 08/12/2023   S/P reverse total shoulder arthroplasty, left 08/12/2023   Onychomycosis 07/30/2022   Chronic gout without tophus 07/30/2022   Primary osteoarthritis, left shoulder 11/13/2021   Chronic right shoulder pain 10/16/2021   Hyperlipidemia due to type 2 diabetes mellitus (HCC) 05/28/2021   Type 2 diabetes mellitus without complication, without long-term current use of insulin (HCC) 08/12/2020   Stage 3a chronic kidney disease (HCC) 08/12/2020   Erectile dysfunction associated with type 2 diabetes mellitus (HCC) 08/12/2020   Hypertension 02/17/2020    PCP: Storm Frisk,  MD   REFERRING PROVIDER: Julieanne Cotton, PA-C   REFERRING DIAG: [phone] (ICD-10-CM) - Primary osteoarthritis, left shoulder  THERAPY DIAG:  Acute pain of left shoulder  Stiffness of left shoulder, not elsewhere classified  Muscle weakness (generalized)  Localized edema  Rationale for Evaluation and Treatment: Rehabilitation  ONSET DATE: DOS 08/12/23 s/p Lt reverse total shoulder arthroplasty biceps tenodesis   SUBJECTIVE:  SUBJECTIVE STATEMENT: Denies pain upon arrival to PT today  Hand dominance: Right  PERTINENT HISTORY: Recent shoulder surgery, Plan is discontinue sling.  Okay to very lightly lift with the operative extremity but no lifting anything heavier than a coffee cup or cell phone.  Start physical therapy to focus on passive range of motion and active range of motion with deltoid isometrics.  Do not want to externally rotate past 30 degrees to protect subscapularis repair.  Follow-up in 4 weeks 09/19/23 for clinical recheck with [name].  PAIN:  Are you having pain? Yes: NPRS scale: 0/10 Pain location: left anterior shoulder Pain description: tight, ache Aggravating factors: reaching too far and finding a comfortable position at night Relieving factors: resting  PRECAUTIONS: Shoulder  RED FLAGS: None   WEIGHT BEARING RESTRICTIONS: No  FALLS:  Has patient fallen in last 6 months? No  OCCUPATION: Manufactoring, janitorial work  PLOF: Independent  PATIENT GOALS:regain shoulder function  NEXT MD VISIT:   OBJECTIVE:  Note: Objective measures were completed at Evaluation unless otherwise noted.  DIAGNOSTIC FINDINGS:  "AP, scapular Y, axillary views of the shoulder reviewed. Reverse shoulder arthroplasty prosthesis in good position and alignment without any complicating  features.  There is no evidence of periprosthetic fracture, dislocation, dissociation of the glenosphere. "  PATIENT SURVEYS:  Eval: FOTO 68% functional, goal is 79% but admits he didn't know how to answer them, he is post op and so would not be performing these activities so starting score is skewed   COGNITION: Overall cognitive status: Within functional limits for tasks assessed     SENSATION: [facility]    UPPER EXTREMITY ROM:   Active ROM PROM Left eval Left 09/16/23    Shoulder flexion P: 120 P; 134   Shoulder extension     Shoulder abduction P:110 P; 130   Shoulder adduction     Shoulder internal rotation P:30    Shoulder external rotation P:30    Elbow flexion     Elbow extension     Wrist flexion     Wrist extension     Wrist ulnar deviation     Wrist radial deviation     Wrist pronation     Wrist supination     (Blank rows = not tested)  UPPER EXTREMITY MMT:  MMT Left Eval  Left 09/09/23 Left 09/18/23  Shoulder flexion 2+ 4 4+  Shoulder extension     Shoulder abduction 2+ 4 4+  Shoulder adduction     Shoulder internal rotation  Not tested due to post op precautions 4+  Shoulder external rotation 3 4 4+  Middle trapezius     Lower trapezius     Elbow flexion     Elbow extension     Wrist flexion     Wrist extension     Wrist ulnar deviation     Wrist radial deviation     Wrist pronation     Wrist supination     Grip strength (lbs)     (Blank rows = not tested)  PALPATION:  Tender to palpation around incision, no signs of infection at eval   TODAY'S TREATMENT:  09/18/23:  TherEx:  Pulleys: flexion x 3 minutes, abduction x 3 minutes UBE: 3 minutes each direction, Level 3 Rows: green TB 2 x 15 holding 3 sec Shoulder extensions: green TB 2 x 15 Shoulder ER elbow at 90 deg, walk outs x 15  AAROM: shoulder abd x 2X10 1# bar AAROM shoulder flexion c 1#  bar 2 x 10  Shoulder extension AAROM 1# bar 2X10 Shoulder isometrics: flexion, extension and  abduction x 10 holding 5 sec  09/16/23:  TherEx:  Pulleys: flexion x 3 minutes, abduction x 3 minutes UBE: 3 minutes each direction, Level 2 Rows: green TB 2 x 15 holding 3 sec Shoulder extensions: green TB 2 x 15 Shoulder ER elbow at 90 deg, walk outs x 15  AAROM: shoulder abd x 15  AAROM shoulder flexion c 1# bar 2 x 10  Shoulder isometrics: flexion, extension and abduction x 10 holding 5 sec    09/11/2023 UBE L2 for 5 min switching directions halfway Pulleys 2 min flexion, 2 min abduction UE ranger AAROM X 10 flexion, abduction, and circles CW and CCW in flexion,  P ball roll up wall into shoulder flexion, 5 sec X 10 Rows green 2X15 Shoulder extensions green 2X15 Shoulder ER to 30 deg green 3X10 Shoulder abd AAROM 1# bar 2X10 Shoulder flexion AAROM 1# bar 2X10 Shoulder Isometrics; flexion; and abduction 10 x 5 seconds each    PATIENT EDUCATION: Education details: HEP, PT plan of care Person educated: Patient Education method: Explanation, Demonstration, Verbal cues, and Handouts Education comprehension: verbalized understanding and needs further education   HOME EXERCISE PROGRAM: Access Code: [email or web address] URL: [email or web address] Date: 09/11/2023 Prepared by: Ivery Quale  Exercises - Circular Shoulder Pendulum with Table Support  - 3 x daily - 7 x weekly - 1 sets - 20 reps - Supine Shoulder Internal Rotation Stretch  - 2 x daily - 6 x weekly - 3 sets - 10 reps - Isometric Shoulder Flexion at Wall  - 2 x daily - 6 x weekly - 1 sets - 10-20 reps - 5 hold - Standing Isometric Shoulder External Rotation with Doorway  - 2 x daily - 6 x weekly - 1 sets - 10-15 reps - 5 hold - Standing Isometric Shoulder Extension with Doorway - Arm Bent  - 2 x daily - 6 x weekly - 1 sets - 10-15 reps - 5 hold - Standing Isometric Shoulder Abduction with Doorway - Arm Bent  - 2 x daily - 6 x weekly - 1 sets - 10-15 reps - Standing Row with Anchored Resistance  - 2 x daily - 6  x weekly - 2-3 sets - 10-15 reps - Shoulder extension with resistance - Neutral  - 2 x daily - 6 x weekly - 2-3 sets - 10 reps - Shoulder External Rotation with Anchored Resistance  - 2 x daily - 6 x weekly - 2-3 sets - 10 reps - Standing Shoulder Flexion AAROM with Dowel  - 2 x daily - 6 x weekly - 2 sets - 10 reps  ASSESSMENT: CLINICAL IMPRESSION:  09/18/2023: He is doing very well up to this point post op. He has made great overall progress with his ROM and strength and is on track to finish PT early.   Eval: Patient referred to PT s/p reverse total shoulder arthroplasty biceps tenodesis 08/12/23. Patient will benefit from skilled PT to address below impairments, limitations and improve overall function.  OBJECTIVE IMPAIRMENTS: decreased activity tolerance, decreased shoulder mobility, decreased ROM, decreased strength, impaired flexibility, impaired UE use, and pain.  ACTIVITY LIMITATIONS: reaching, lifting, carry,  cleaning, driving, and or occupation  PERSONAL FACTORS:DM also affecting patient's functional outcome.  REHAB POTENTIAL: Good  CLINICAL DECISION MAKING: Stable/uncomplicated  EVALUATION COMPLEXITY: Low    GOALS: Short term PT Goals Target date: 09/23/2023   Pt will be I and  compliant with HEP. Baseline:  Goal status: MET 09/16/23 2. Pt will improve shoulder PROM to [facility]  Goal status: on-going 09/16/23  Long term PT goals Target date:10/21/2023  Pt will improve Lt shoulder AROM to [facility] to improve functional reaching Baseline: Goal status: New Pt will improve  Rt shoulder strength to at least 4+/5 MMT to improve functional strength Baseline: Goal status: New Pt will improve FOTO to at least 79% functional to show improved function Baseline: Goal status: New Pt will reduce pain to overall less than 3/10 with usual activity and work activity. Baseline: Goal status: New  PLAN: PT FREQUENCY: 1-2 times per week   PT DURATION: 6-8 weeks  PLANNED  INTERVENTIONS (unless contraindicated): aquatic PT, Canalith repositioning, cryotherapy, Electrical stimulation, Iontophoresis with 4 mg/ml dexamethasome, Moist heat, traction, Ultrasound, gait training, Therapeutic exercise, balance training, neuromuscular re-education, patient/family education, prosthetic training, manual techniques, passive ROM, dry needling, taping, vasopnuematic device, vestibular, spinal manipulations, joint manipulations  PLAN FOR NEXT SESSION: OK to very lightly lift with the operative extremity but no lifting anything heavier than a coffee cup or cell phone.  Focus on passive range of motion and active range of motion with deltoid isometrics.  Do not want to externally rotate past 30 degrees or weight-bear in extension to protect subscapularis repair until sees [name] 09/19/23.   Ivery Quale, PT, DPT 09/18/23 10:02 AM

## 2023-09-19 ENCOUNTER — Ambulatory Visit (INDEPENDENT_AMBULATORY_CARE_PROVIDER_SITE_OTHER): Payer: BC Managed Care – PPO | Admitting: Orthopedic Surgery

## 2023-09-19 DIAGNOSIS — M19012 Primary osteoarthritis, left shoulder: Secondary | ICD-10-CM

## 2023-09-20 ENCOUNTER — Encounter: Payer: Self-pay | Admitting: Orthopedic Surgery

## 2023-09-20 NOTE — Progress Notes (Signed)
   Post-Op Visit Note   Patient: Timothy Warren           Date of Birth: 05-07-1961           MRN: [id number] Visit Date: 09/19/2023 PCP: Storm Frisk, MD   Assessment & Plan:  Chief Complaint:  Chief Complaint  Patient presents with   Left Shoulder - Routine Post Op    left reverse shoulder arthroplasty on 08/12/2023   Visit Diagnoses:  1. Primary osteoarthritis, left shoulder     Plan: Afton is a patient who is now 6 weeks out left reverse shoulder replacement.  He is sleeping well.  Doing physical therapy as well as home exercise program.  On exam the incision is intact.  Range of motion is 45/80/130.  He is doing more with it.  Would like him to come back in 6 weeks for final check and release at that time.  Continue with physical therapy.  Continue with home exercise programs.  Follow-Up Instructions: No follow-ups on file.   Orders:  No orders of the defined types were placed in this encounter.  No orders of the defined types were placed in this encounter.   Imaging: No results found.  PMFS History: Patient Active Problem List   Diagnosis Date Noted   Biceps tendonitis, left 09/02/2023   Arthritis of left shoulder region 09/02/2023   OA (osteoarthritis) of shoulder 08/12/2023   S/P reverse total shoulder arthroplasty, left 08/12/2023   Onychomycosis 07/30/2022   Chronic gout without tophus 07/30/2022   Primary osteoarthritis, left shoulder 11/13/2021   Chronic right shoulder pain 10/16/2021   Hyperlipidemia due to type 2 diabetes mellitus (HCC) 05/28/2021   Type 2 diabetes mellitus without complication, without long-term current use of insulin (HCC) 08/12/2020   Stage 3a chronic kidney disease (HCC) 08/12/2020   Erectile dysfunction associated with type 2 diabetes mellitus (HCC) 08/12/2020   Hypertension 02/17/2020   Past Medical History:  Diagnosis Date   Allergy    Chronic kidney disease    stage 2 CKD   Diabetes mellitus without complication (HCC)     Hyperlipidemia    Hypertension    Seasonal allergies     Family History  Problem Relation Age of Onset   Hypertension Mother    Hypertension Father    Colon cancer Neg Hx    Colon polyps Neg Hx    Esophageal cancer Neg Hx    Rectal cancer Neg Hx    Stomach cancer Neg Hx     Past Surgical History:  Procedure Laterality Date   BICEPT TENODESIS Left 08/12/2023   Procedure: BICEPS TENODESIS;  Surgeon: Cammy Copa, MD;  Location: [facility] OR;  Service: Orthopedics;  Laterality: Left;   COLONOSCOPY     REVERSE SHOULDER ARTHROPLASTY Left 08/12/2023   Procedure: REVERSE SHOULDER ARTHROPLASTY;  Surgeon: Cammy Copa, MD;  Location: [facility] OR;  Service: Orthopedics;  Laterality: Left;   Social History   Occupational History   Not on file  Tobacco Use   Smoking status: Never   Smokeless tobacco: Never  Vaping Use   Vaping status: Never Used  Substance and Sexual Activity   Alcohol use: Yes    Comment: maybe a drink once a month   Drug use: Yes    Types: Marijuana    Comment: Daily   Sexual activity: Yes

## 2023-09-23 ENCOUNTER — Encounter: Payer: Self-pay | Admitting: Physical Therapy

## 2023-09-23 ENCOUNTER — Ambulatory Visit (INDEPENDENT_AMBULATORY_CARE_PROVIDER_SITE_OTHER): Payer: BC Managed Care – PPO | Admitting: Physical Therapy

## 2023-09-23 DIAGNOSIS — M25512 Pain in left shoulder: Secondary | ICD-10-CM | POA: Diagnosis not present

## 2023-09-23 DIAGNOSIS — M6281 Muscle weakness (generalized): Secondary | ICD-10-CM | POA: Diagnosis not present

## 2023-09-23 DIAGNOSIS — M25612 Stiffness of left shoulder, not elsewhere classified: Secondary | ICD-10-CM

## 2023-09-23 NOTE — Therapy (Signed)
OUTPATIENT PHYSICAL THERAPY SHOULDER TREATMENT   Patient Name: Timothy Warren MRN: [id number] DOB:Feb 03, 1961, 62 y.o., male Today's Date: 09/23/2023  END OF SESSION:  PT End of Session - 09/23/23 0941     Visit Number 7    Number of Visits 12    Date for PT Re-Evaluation 10/21/23    Activity Tolerance Patient tolerated treatment well;No increased pain    Behavior During Therapy [facility] for tasks assessed/performed                Past Medical History:  Diagnosis Date   Allergy    Chronic kidney disease    stage 2 CKD   Diabetes mellitus without complication (HCC)    Hyperlipidemia    Hypertension    Seasonal allergies    Past Surgical History:  Procedure Laterality Date   BICEPT TENODESIS Left 08/12/2023   Procedure: BICEPS TENODESIS;  Surgeon: Cammy Copa, MD;  Location: [facility] OR;  Service: Orthopedics;  Laterality: Left;   COLONOSCOPY     REVERSE SHOULDER ARTHROPLASTY Left 08/12/2023   Procedure: REVERSE SHOULDER ARTHROPLASTY;  Surgeon: Cammy Copa, MD;  Location: [facility] OR;  Service: Orthopedics;  Laterality: Left;   Patient Active Problem List   Diagnosis Date Noted   Biceps tendonitis, left 09/02/2023   Arthritis of left shoulder region 09/02/2023   OA (osteoarthritis) of shoulder 08/12/2023   S/P reverse total shoulder arthroplasty, left 08/12/2023   Onychomycosis 07/30/2022   Chronic gout without tophus 07/30/2022   Primary osteoarthritis, left shoulder 11/13/2021   Chronic right shoulder pain 10/16/2021   Hyperlipidemia due to type 2 diabetes mellitus (HCC) 05/28/2021   Type 2 diabetes mellitus without complication, without long-term current use of insulin (HCC) 08/12/2020   Stage 3a chronic kidney disease (HCC) 08/12/2020   Erectile dysfunction associated with type 2 diabetes mellitus (HCC) 08/12/2020   Hypertension 02/17/2020    PCP: Storm Frisk, MD   REFERRING PROVIDER: Julieanne Cotton, PA-C   REFERRING DIAG: [phone] (ICD-10-CM)  - Primary osteoarthritis, left shoulder  THERAPY DIAG:  Acute pain of left shoulder  Stiffness of left shoulder, not elsewhere classified  Muscle weakness (generalized)  Rationale for Evaluation and Treatment: Rehabilitation  ONSET DATE: DOS 08/12/23 s/p Lt reverse total shoulder arthroplasty biceps tenodesis   SUBJECTIVE:                                                                                                                                                                                      SUBJECTIVE STATEMENT: Reports doing well with his shoulder but also having some stiffness with horizontal adduction.  Hand dominance: Right  PERTINENT HISTORY: Recent shoulder surgery,  Plan is discontinue sling.  Okay to very lightly lift with the operative extremity but no lifting anything heavier than a coffee cup or cell phone.  Start physical therapy to focus on passive range of motion and active range of motion with deltoid isometrics.  Do not want to externally rotate past 30 degrees to protect subscapularis repair.  Follow-up in 4 weeks 09/19/23 for clinical recheck with [name].  PAIN:  Are you having pain? Yes: NPRS scale: 0/10 Pain location: left anterior shoulder Pain description: tight, ache Aggravating factors: reaching too far and finding a comfortable position at night Relieving factors: resting  PRECAUTIONS: Shoulder  RED FLAGS: None   WEIGHT BEARING RESTRICTIONS: No  FALLS:  Has patient fallen in last 6 months? No  OCCUPATION: Manufactoring, janitorial work  PLOF: Independent  PATIENT GOALS:regain shoulder function  NEXT MD VISIT:   OBJECTIVE:  Note: Objective measures were completed at Evaluation unless otherwise noted.  DIAGNOSTIC FINDINGS:  "AP, scapular Y, axillary views of the shoulder reviewed. Reverse shoulder arthroplasty prosthesis in good position and alignment without any complicating features.  There is no evidence of periprosthetic  fracture, dislocation, dissociation of the glenosphere. "  PATIENT SURVEYS:  Eval: FOTO 68% functional, goal is 79% but admits he didn't know how to answer them, he is post op and so would not be performing these activities so starting score is skewed   COGNITION: Overall cognitive status: Within functional limits for tasks assessed     SENSATION: [facility]    UPPER EXTREMITY ROM:   Active ROM PROM Left eval Left 09/16/23    Shoulder flexion P: 120 P; 134   Shoulder extension     Shoulder abduction P:110 P; 130   Shoulder adduction     Shoulder internal rotation P:30    Shoulder external rotation P:30    Elbow flexion     Elbow extension     Wrist flexion     Wrist extension     Wrist ulnar deviation     Wrist radial deviation     Wrist pronation     Wrist supination     (Blank rows = not tested)  UPPER EXTREMITY MMT:  MMT Left Eval  Left 09/09/23 Left 09/18/23  Shoulder flexion 2+ 4 4+  Shoulder extension     Shoulder abduction 2+ 4 4+  Shoulder adduction     Shoulder internal rotation  Not tested due to post op precautions 4+  Shoulder external rotation 3 4 4+  Middle trapezius     Lower trapezius     Elbow flexion     Elbow extension     Wrist flexion     Wrist extension     Wrist ulnar deviation     Wrist radial deviation     Wrist pronation     Wrist supination     Grip strength (lbs)     (Blank rows = not tested)  PALPATION:  Tender to palpation around incision, no signs of infection at eval   TODAY'S TREATMENT:  09/23/23:  TherEx:  UBE: 3 minutes each direction, Level 3 Posterior capsule stretch 10 sec X5 Shoulder flexion 2# 2X10 bilat Shoulder abd 2# 2X10 bilat Rows: green TB 2 x 15 holding 3 sec Shoulder extensions: green TB 2 x 15 Shoulder ER green 2X15 Shoulder IR green 2X15 Overhead 2#ball stability rolls X 15 each direction and circles HEP progressions and review  09/18/23:  TherEx:  Pulleys: flexion x 3 minutes, abduction x 3  minutes UBE: 3 minutes each direction, Level 3 Rows: green TB 2 x 15 holding 3 sec Shoulder extensions: green TB 2 x 15 Shoulder ER elbow at 90 deg, walk outs x 15  AAROM: shoulder abd x 2X10 1# bar AAROM shoulder flexion c 1# bar 2 x 10  Shoulder extension AAROM 1# bar 2X10 Shoulder isometrics: flexion, extension and abduction x 10 holding 5 sec    PATIENT EDUCATION: Education details: HEP, PT plan of care Person educated: Patient Education method: Explanation, Demonstration, Verbal cues, and Handouts Education comprehension: verbalized understanding and needs further education   HOME EXERCISE PROGRAM: Access Code: [email or web address] URL: [email or web address] Date: 09/23/2023 Prepared by: Ivery Quale  Exercises - Standing Row with Anchored Resistance  - 2 x daily - 6 x weekly - 2-3 sets - 10-15 reps - Shoulder extension with resistance - Neutral  - 2 x daily - 6 x weekly - 2-3 sets - 10 reps - Shoulder External Rotation with Anchored Resistance  - 2 x daily - 6 x weekly - 2-3 sets - 10 reps - Shoulder Internal Rotation with Resistance  - 2 x daily - 6 x weekly - 2-3 sets - 10 reps - Standing Shoulder Flexion AAROM with Dowel  - 2 x daily - 6 x weekly - 2 sets - 10 reps - Standing Shoulder Flexion to 90 Degrees with Dumbbells  - 2 x daily - 6 x weekly - 2 sets - 10 reps - Shoulder Abduction with Dumbbells - Thumbs Up  - 2 x daily - 6 x weekly - 2 sets - 10 reps - Modified Posterior Capsule Stretch  - 2 x daily - 6 x weekly - 1 sets - 5 reps - 10 sec hold  ASSESSMENT: CLINICAL IMPRESSION:  09/23/2023: I progressed his HEP to incorporate more strengthening now that he is beyond 6 weeks post op. He did great with this today in session. We will monitor for any soreness and adjust PRN.    Eval: Patient referred to PT s/p reverse total shoulder arthroplasty biceps tenodesis 08/12/23. Patient will benefit from skilled PT to address below impairments, limitations and improve  overall function.  OBJECTIVE IMPAIRMENTS: decreased activity tolerance, decreased shoulder mobility, decreased ROM, decreased strength, impaired flexibility, impaired UE use, and pain.  ACTIVITY LIMITATIONS: reaching, lifting, carry,  cleaning, driving, and or occupation  PERSONAL FACTORS:DM also affecting patient's functional outcome.  REHAB POTENTIAL: Good  CLINICAL DECISION MAKING: Stable/uncomplicated  EVALUATION COMPLEXITY: Low    GOALS: Short term PT Goals Target date: 09/23/2023   Pt will be I and compliant with HEP. Baseline:  Goal status: MET 09/16/23 2. Pt will improve shoulder PROM to [facility]  Goal status: on-going 09/16/23  Long term PT goals Target date:10/21/2023  Pt will improve Lt shoulder AROM to [facility] to improve functional reaching Baseline: Goal status: ongoing 11/19 Pt will improve  Rt shoulder strength to at least 4+/5 MMT to improve functional strength Baseline: Goal status: ongoing 11/19 Pt will improve FOTO to at least 79% functional to show improved function Baseline: Goal status: ongoing 11/19 Pt will reduce pain to overall less than 3/10 with usual activity and work activity. Baseline: Goal status: MET 11/19  PLAN: PT FREQUENCY: 1-2 times per week   PT DURATION: 6-8 weeks  PLANNED INTERVENTIONS (unless contraindicated): aquatic PT, Canalith repositioning, cryotherapy, Electrical stimulation, Iontophoresis with 4 mg/ml dexamethasome, Moist heat, traction, Ultrasound, gait training, Therapeutic exercise, balance training, neuromuscular re-education, patient/family education, prosthetic training, manual techniques, passive ROM, dry needling, taping,  vasopnuematic device, vestibular, spinal manipulations, joint manipulations  PLAN FOR NEXT SESSION: progress as tolerated now that he is over 6 weeks post op  Ivery Quale, PT, DPT 09/23/23 9:42 AM

## 2023-09-29 ENCOUNTER — Ambulatory Visit (INDEPENDENT_AMBULATORY_CARE_PROVIDER_SITE_OTHER): Payer: BC Managed Care – PPO | Admitting: Physical Therapy

## 2023-09-29 ENCOUNTER — Encounter: Payer: Self-pay | Admitting: Physical Therapy

## 2023-09-29 DIAGNOSIS — R600 Localized edema: Secondary | ICD-10-CM | POA: Diagnosis not present

## 2023-09-29 DIAGNOSIS — M6281 Muscle weakness (generalized): Secondary | ICD-10-CM

## 2023-09-29 DIAGNOSIS — M25512 Pain in left shoulder: Secondary | ICD-10-CM | POA: Diagnosis not present

## 2023-09-29 DIAGNOSIS — M25612 Stiffness of left shoulder, not elsewhere classified: Secondary | ICD-10-CM | POA: Diagnosis not present

## 2023-09-29 NOTE — Therapy (Signed)
OUTPATIENT PHYSICAL THERAPY SHOULDER TREATMENT   Patient Name: Timothy Warren MRN: [id number] DOB:February 12, 1961, 62 y.o., male Today's Date: 09/29/2023  END OF SESSION:  PT End of Session - 09/29/23 0942     Visit Number 8    Number of Visits 12    Date for PT Re-Evaluation 10/21/23    PT Start Time 0930    PT Stop Time 1008    PT Time Calculation (min) 38 min    Activity Tolerance Patient tolerated treatment well;No increased pain    Behavior During Therapy [facility] for tasks assessed/performed                Past Medical History:  Diagnosis Date   Allergy    Chronic kidney disease    stage 2 CKD   Diabetes mellitus without complication (HCC)    Hyperlipidemia    Hypertension    Seasonal allergies    Past Surgical History:  Procedure Laterality Date   BICEPT TENODESIS Left 08/12/2023   Procedure: BICEPS TENODESIS;  Surgeon: Cammy Copa, MD;  Location: [facility] OR;  Service: Orthopedics;  Laterality: Left;   COLONOSCOPY     REVERSE SHOULDER ARTHROPLASTY Left 08/12/2023   Procedure: REVERSE SHOULDER ARTHROPLASTY;  Surgeon: Cammy Copa, MD;  Location: [facility] OR;  Service: Orthopedics;  Laterality: Left;   Patient Active Problem List   Diagnosis Date Noted   Biceps tendonitis, left 09/02/2023   Arthritis of left shoulder region 09/02/2023   OA (osteoarthritis) of shoulder 08/12/2023   S/P reverse total shoulder arthroplasty, left 08/12/2023   Onychomycosis 07/30/2022   Chronic gout without tophus 07/30/2022   Primary osteoarthritis, left shoulder 11/13/2021   Chronic right shoulder pain 10/16/2021   Hyperlipidemia due to type 2 diabetes mellitus (HCC) 05/28/2021   Type 2 diabetes mellitus without complication, without long-term current use of insulin (HCC) 08/12/2020   Stage 3a chronic kidney disease (HCC) 08/12/2020   Erectile dysfunction associated with type 2 diabetes mellitus (HCC) 08/12/2020   Hypertension 02/17/2020    PCP: Storm Frisk,  MD   REFERRING PROVIDER: Julieanne Cotton, PA-C   REFERRING DIAG: [phone] (ICD-10-CM) - Primary osteoarthritis, left shoulder  THERAPY DIAG:  Acute pain of left shoulder  Stiffness of left shoulder, not elsewhere classified  Muscle weakness (generalized)  Localized edema  Rationale for Evaluation and Treatment: Rehabilitation  ONSET DATE: DOS 08/12/23 s/p Lt reverse total shoulder arthroplasty biceps tenodesis   SUBJECTIVE:  SUBJECTIVE STATEMENT: Says his shoulder is doing good, denies pain Hand dominance: Right  PERTINENT HISTORY: Recent shoulder surgery, Plan is discontinue sling.  Okay to very lightly lift with the operative extremity but no lifting anything heavier than a coffee cup or cell phone.  Start physical therapy to focus on passive range of motion and active range of motion with deltoid isometrics.  Do not want to externally rotate past 30 degrees to protect subscapularis repair.  Follow-up in 4 weeks 09/19/23 for clinical recheck with [name].  PAIN:  Are you having pain? Yes: NPRS scale: 0/10 Pain location: left anterior shoulder Pain description: tight, ache Aggravating factors: reaching too far and finding a comfortable position at night Relieving factors: resting  PRECAUTIONS: Shoulder  RED FLAGS: None   WEIGHT BEARING RESTRICTIONS: No  FALLS:  Has patient fallen in last 6 months? No  OCCUPATION: Manufactoring, janitorial work  PLOF: Independent  PATIENT GOALS:regain shoulder function  NEXT MD VISIT:   OBJECTIVE:  Note: Objective measures were completed at Evaluation unless otherwise noted.  DIAGNOSTIC FINDINGS:  "AP, scapular Y, axillary views of the shoulder reviewed. Reverse shoulder arthroplasty prosthesis in good position and alignment without any  complicating features.  There is no evidence of periprosthetic fracture, dislocation, dissociation of the glenosphere. "  PATIENT SURVEYS:  Eval: FOTO 68% functional, goal is 79% but admits he didn't know how to answer them, he is post op and so would not be performing these activities so starting score is skewed   COGNITION: Overall cognitive status: Within functional limits for tasks assessed     SENSATION: [facility]    UPPER EXTREMITY ROM:   Active ROM PROM Left eval Left 09/16/23    Shoulder flexion P: 120 P; 134   Shoulder extension     Shoulder abduction P:110 P; 130   Shoulder adduction     Shoulder internal rotation P:30    Shoulder external rotation P:30    Elbow flexion     Elbow extension     Wrist flexion     Wrist extension     Wrist ulnar deviation     Wrist radial deviation     Wrist pronation     Wrist supination     (Blank rows = not tested)  UPPER EXTREMITY MMT:  MMT Left Eval  Left 09/09/23 Left 09/18/23  Shoulder flexion 2+ 4 4+  Shoulder extension     Shoulder abduction 2+ 4 4+  Shoulder adduction     Shoulder internal rotation  Not tested due to post op precautions 4+  Shoulder external rotation 3 4 4+  Middle trapezius     Lower trapezius     Elbow flexion     Elbow extension     Wrist flexion     Wrist extension     Wrist ulnar deviation     Wrist radial deviation     Wrist pronation     Wrist supination     Grip strength (lbs)     (Blank rows = not tested)  PALPATION:  Tender to palpation around incision, no signs of infection at eval   TODAY'S TREATMENT:  09/29/23:  TherEx:  UBE: 3 minutes each direction, Level 4 Shoulder AAROM flexion, abd, Ext, IR all 2# bar 2X10 Posterior capsule stretch 10 sec X5 Shoulder flexion 2# 2X10 bilat Shoulder abd 2# 2X10 bilat Rows: blue TB 2 x 15 holding 3 sec Shoulder extensions: blue TB 2 x 15 Shoulder ER green 2X15 Shoulder IR  green 2X15 Shoulder punch blue 2X15 Overhead 2#ball  stability rolls X 15 each direction and circles Seated row machine 35# 2X15 Chest press machine 20# 2X15  09/23/23:  TherEx:  UBE: 3 minutes each direction, Level 3 Posterior capsule stretch 10 sec X5 Shoulder flexion 2# 2X10 bilat Shoulder abd 2# 2X10 bilat Rows: green TB 2 x 15 holding 3 sec Shoulder extensions: green TB 2 x 15 Shoulder ER green 2X15 Shoulder IR green 2X15 Overhead 2#ball stability rolls X 15 each direction and circles HEP progressions and review  09/18/23:  TherEx:  Pulleys: flexion x 3 minutes, abduction x 3 minutes UBE: 3 minutes each direction, Level 3 Rows: green TB 2 x 15 holding 3 sec Shoulder extensions: green TB 2 x 15 Shoulder ER elbow at 90 deg, walk outs x 15  AAROM: shoulder abd x 2X10 1# bar AAROM shoulder flexion c 1# bar 2 x 10  Shoulder extension AAROM 1# bar 2X10 Shoulder isometrics: flexion, extension and abduction x 10 holding 5 sec    PATIENT EDUCATION: Education details: HEP, PT plan of care Person educated: Patient Education method: Explanation, Demonstration, Verbal cues, and Handouts Education comprehension: verbalized understanding and needs further education   HOME EXERCISE PROGRAM: Access Code: [email or web address] URL: [email or web address] Date: 09/23/2023 Prepared by: Ivery Quale  Exercises - Standing Row with Anchored Resistance  - 2 x daily - 6 x weekly - 2-3 sets - 10-15 reps - Shoulder extension with resistance - Neutral  - 2 x daily - 6 x weekly - 2-3 sets - 10 reps - Shoulder External Rotation with Anchored Resistance  - 2 x daily - 6 x weekly - 2-3 sets - 10 reps - Shoulder Internal Rotation with Resistance  - 2 x daily - 6 x weekly - 2-3 sets - 10 reps - Standing Shoulder Flexion AAROM with Dowel  - 2 x daily - 6 x weekly - 2 sets - 10 reps - Standing Shoulder Flexion to 90 Degrees with Dumbbells  - 2 x daily - 6 x weekly - 2 sets - 10 reps - Shoulder Abduction with Dumbbells - Thumbs Up  - 2 x daily - 6 x  weekly - 2 sets - 10 reps - Modified Posterior Capsule Stretch  - 2 x daily - 6 x weekly - 1 sets - 5 reps - 10 sec hold  ASSESSMENT: CLINICAL IMPRESSION:  09/29/2023: He is doing very well, had no pain reported with strength progressions today. He has one more visit scheduled and will likely be ready to discharge early if he continues to do well.   Eval: Patient referred to PT s/p reverse total shoulder arthroplasty biceps tenodesis 08/12/23. Patient will benefit from skilled PT to address below impairments, limitations and improve overall function.  OBJECTIVE IMPAIRMENTS: decreased activity tolerance, decreased shoulder mobility, decreased ROM, decreased strength, impaired flexibility, impaired UE use, and pain.  ACTIVITY LIMITATIONS: reaching, lifting, carry,  cleaning, driving, and or occupation  PERSONAL FACTORS:DM also affecting patient's functional outcome.  REHAB POTENTIAL: Good  CLINICAL DECISION MAKING: Stable/uncomplicated  EVALUATION COMPLEXITY: Low    GOALS: Short term PT Goals Target date: 09/23/2023   Pt will be I and compliant with HEP. Baseline:  Goal status: MET 09/16/23 2. Pt will improve shoulder PROM to [facility]  Goal status: on-going 09/16/23  Long term PT goals Target date:10/21/2023  Pt will improve Lt shoulder AROM to [facility] to improve functional reaching Baseline: Goal status: ongoing 11/19 Pt will improve  Rt shoulder strength to at  least 4+/5 MMT to improve functional strength Baseline: Goal status: ongoing 11/19 Pt will improve FOTO to at least 79% functional to show improved function Baseline: Goal status: ongoing 11/19 Pt will reduce pain to overall less than 3/10 with usual activity and work activity. Baseline: Goal status: MET 11/19  PLAN: PT FREQUENCY: 1-2 times per week   PT DURATION: 6-8 weeks  PLANNED INTERVENTIONS (unless contraindicated): aquatic PT, Canalith repositioning, cryotherapy, Electrical stimulation, Iontophoresis with 4  mg/ml dexamethasome, Moist heat, traction, Ultrasound, gait training, Therapeutic exercise, balance training, neuromuscular re-education, patient/family education, prosthetic training, manual techniques, passive ROM, dry needling, taping, vasopnuematic device, vestibular, spinal manipulations, joint manipulations  PLAN FOR NEXT SESSION: assess for readiness to DC to HEP  Ivery Quale, PT, DPT 09/29/23 10:22 AM

## 2023-10-08 ENCOUNTER — Encounter: Payer: Self-pay | Admitting: Physical Therapy

## 2023-10-08 ENCOUNTER — Ambulatory Visit: Payer: BC Managed Care – PPO | Admitting: Physical Therapy

## 2023-10-08 DIAGNOSIS — M25612 Stiffness of left shoulder, not elsewhere classified: Secondary | ICD-10-CM | POA: Diagnosis not present

## 2023-10-08 DIAGNOSIS — M6281 Muscle weakness (generalized): Secondary | ICD-10-CM

## 2023-10-08 DIAGNOSIS — M25512 Pain in left shoulder: Secondary | ICD-10-CM | POA: Diagnosis not present

## 2023-10-08 DIAGNOSIS — R600 Localized edema: Secondary | ICD-10-CM

## 2023-10-08 NOTE — Therapy (Signed)
OUTPATIENT PHYSICAL THERAPY SHOULDER TREATMENT/Discharge PHYSICAL THERAPY DISCHARGE SUMMARY  Visits from Start of Care: 9  Current functional level related to goals / functional outcomes: See below   Remaining deficits: See below   Education / Equipment: HEP  Plan:  Patient goals were met. Patient is being discharged due to meeting treatment goals and he feels ready to discharge      Patient Name: Timothy Warren MRN: [id number] DOB:December 28, 1960, 62 y.o., male Today's Date: 10/08/2023  END OF SESSION:  PT End of Session - 10/08/23 0927     Visit Number 9    Number of Visits 12    Date for PT Re-Evaluation 10/21/23    PT Start Time 0926    PT Stop Time 1004    PT Time Calculation (min) 38 min    Activity Tolerance Patient tolerated treatment well;No increased pain    Behavior During Therapy [facility] for tasks assessed/performed                Past Medical History:  Diagnosis Date   Allergy    Chronic kidney disease    stage 2 CKD   Diabetes mellitus without complication (HCC)    Hyperlipidemia    Hypertension    Seasonal allergies    Past Surgical History:  Procedure Laterality Date   BICEPT TENODESIS Left 08/12/2023   Procedure: BICEPS TENODESIS;  Surgeon: Cammy Copa, MD;  Location: [facility] OR;  Service: Orthopedics;  Laterality: Left;   COLONOSCOPY     REVERSE SHOULDER ARTHROPLASTY Left 08/12/2023   Procedure: REVERSE SHOULDER ARTHROPLASTY;  Surgeon: Cammy Copa, MD;  Location: [facility] OR;  Service: Orthopedics;  Laterality: Left;   Patient Active Problem List   Diagnosis Date Noted   Biceps tendonitis, left 09/02/2023   Arthritis of left shoulder region 09/02/2023   OA (osteoarthritis) of shoulder 08/12/2023   S/P reverse total shoulder arthroplasty, left 08/12/2023   Onychomycosis 07/30/2022   Chronic gout without tophus 07/30/2022   Primary osteoarthritis, left shoulder 11/13/2021   Chronic right shoulder pain 10/16/2021   Hyperlipidemia due to  type 2 diabetes mellitus (HCC) 05/28/2021   Type 2 diabetes mellitus without complication, without long-term current use of insulin (HCC) 08/12/2020   Stage 3a chronic kidney disease (HCC) 08/12/2020   Erectile dysfunction associated with type 2 diabetes mellitus (HCC) 08/12/2020   Hypertension 02/17/2020    PCP: Storm Frisk, MD   REFERRING PROVIDER: Julieanne Cotton, PA-C   REFERRING DIAG: [phone] (ICD-10-CM) - Primary osteoarthritis, left shoulder  THERAPY DIAG:  Acute pain of left shoulder  Stiffness of left shoulder, not elsewhere classified  Muscle weakness (generalized)  Localized edema  Rationale for Evaluation and Treatment: Rehabilitation  ONSET DATE: DOS 08/12/23 s/p Lt reverse total shoulder arthroplasty biceps tenodesis   SUBJECTIVE:  SUBJECTIVE STATEMENT: He reports his shoulder is doing good, a little ache every now in then. Feels ready to finish up with PT today. Hand dominance: Right  PERTINENT HISTORY: Recent shoulder surgery, Plan is discontinue sling.  Okay to very lightly lift with the operative extremity but no lifting anything heavier than a coffee cup or cell phone.  Start physical therapy to focus on passive range of motion and active range of motion with deltoid isometrics.  Do not want to externally rotate past 30 degrees to protect subscapularis repair.  Follow-up in 4 weeks 09/19/23 for clinical recheck with [name].  PAIN:  Are you having pain? Yes: NPRS scale: 0/10 Pain location: left anterior shoulder Pain description: tight, ache Aggravating factors: reaching too far and finding a comfortable position at night Relieving factors: resting  PRECAUTIONS: Shoulder  RED FLAGS: None   WEIGHT BEARING RESTRICTIONS: No  FALLS:  Has patient fallen in last 6  months? No  OCCUPATION: Manufactoring, janitorial work  PLOF: Independent  PATIENT GOALS:regain shoulder function  NEXT MD VISIT:   OBJECTIVE:  Note: Objective measures were completed at Evaluation unless otherwise noted.  DIAGNOSTIC FINDINGS:  "AP, scapular Y, axillary views of the shoulder reviewed. Reverse shoulder arthroplasty prosthesis in good position and alignment without any complicating features.  There is no evidence of periprosthetic fracture, dislocation, dissociation of the glenosphere. "  PATIENT SURVEYS:  Eval: FOTO 68% functional, goal is 79% but admits he didn't know how to answer them, he is post op and so would not be performing these activities so starting score is skewed  10/08/23: FOTO improved to 83% and met goal.   COGNITION: Overall cognitive status: Within functional limits for tasks assessed     SENSATION: [facility]    UPPER EXTREMITY ROM:   Active ROM PROM Left eval Left 09/16/23 Left 10/08/23   Shoulder flexion P: 120 P; 134 A:135  Shoulder extension     Shoulder abduction P:110 P; 130 A:120  Shoulder adduction     Shoulder internal rotation P:30  A:to left SIJ reaching behind back  Shoulder external rotation P:30  A: WFL reaching behind head  Elbow flexion     Elbow extension     Wrist flexion     Wrist extension     Wrist ulnar deviation     Wrist radial deviation     Wrist pronation     Wrist supination     (Blank rows = not tested)  UPPER EXTREMITY MMT:  MMT Left Eval  Left 09/09/23 Left 09/18/23 Left 10/08/23  Shoulder flexion 2+ 4 4+ 5  Shoulder extension      Shoulder abduction 2+ 4 4+ 5  Shoulder adduction      Shoulder internal rotation  Not tested due to post op precautions 4+ 5  Shoulder external rotation 3 4 4+ 5  Middle trapezius      Lower trapezius      Elbow flexion      Elbow extension      Wrist flexion      Wrist extension      Wrist ulnar deviation      Wrist radial deviation      Wrist pronation       Wrist supination      Grip strength (lbs)      (Blank rows = not tested)  PALPATION:  Tender to palpation around incision, no signs of infection at eval   TODAY'S TREATMENT:  10/08/23:  TherEx:  UBE: 3  minutes each direction, Level 4 Shoulder IR stretch with strap behind back 5 sec 2 X 10 Doorway ER stretch 5 sec X 15 Shoulder AAROM flexion, IR all 3# bar 2X10 Posterior capsule stretch 10 sec X5 Shoulder flexion 2# 2X10 bilat Shoulder abd 2# 2X10 bilat Rows: 35# 2X10 Chest press machine 15# 2X10 Shoulder extensions: blue TB 2 x 15 Shoulder ER blue 2X15 Shoulder IR green 2X15 Overhead 2#ball stability rolls X 15 each direction and circles  09/29/23:  TherEx:  UBE: 3 minutes each direction, Level 4 Shoulder AAROM flexion, abd, Ext, IR all 2# bar 2X10 Posterior capsule stretch 10 sec X5 Shoulder flexion 2# 2X10 bilat Shoulder abd 2# 2X10 bilat Rows: blue TB 2 x 15 holding 3 sec Shoulder extensions: blue TB 2 x 15 Shoulder ER green 2X15 Shoulder IR green 2X15 Shoulder punch blue 2X15 Overhead 2#ball stability rolls X 15 each direction and circles Seated row machine 35# 2X15 Chest press machine 20# 2X15    PATIENT EDUCATION: Education details: HEP, PT plan of care Person educated: Patient Education method: Explanation, Demonstration, Verbal cues, and Handouts Education comprehension: verbalized understanding and needs further education   HOME EXERCISE PROGRAM: Access Code: [email or web address] URL: [email or web address] Date: 09/23/2023 Prepared by: Ivery Quale  Exercises - Standing Row with Anchored Resistance  - 2 x daily - 6 x weekly - 2-3 sets - 10-15 reps - Shoulder extension with resistance - Neutral  - 2 x daily - 6 x weekly - 2-3 sets - 10 reps - Shoulder External Rotation with Anchored Resistance  - 2 x daily - 6 x weekly - 2-3 sets - 10 reps - Shoulder Internal Rotation with Resistance  - 2 x daily - 6 x weekly - 2-3 sets - 10 reps - Standing  Shoulder Flexion AAROM with Dowel  - 2 x daily - 6 x weekly - 2 sets - 10 reps - Standing Shoulder Flexion to 90 Degrees with Dumbbells  - 2 x daily - 6 x weekly - 2 sets - 10 reps - Shoulder Abduction with Dumbbells - Thumbs Up  - 2 x daily - 6 x weekly - 2 sets - 10 reps - Modified Posterior Capsule Stretch  - 2 x daily - 6 x weekly - 1 sets - 5 reps - 10 sec hold Access Code: [email or web address] URL: [email or web address] Date: 10/08/2023 Prepared by: Ivery Quale  Exercises - shoulder ER stretch in doorway (Mirrored)  - 1 x daily - 6 x weekly - 1 sets - 15 reps - 5 sec hold - Standing Shoulder Internal Rotation Stretch with Towel (Mirrored)  - 1 x daily - 6 x weekly - 1 sets - 10 reps - 5 sec hold - Standing Shoulder Internal Rotation Stretch Behind Back (Mirrored)  - 1 x daily - 6 x weekly - 1 sets - 10 reps - 5 sec hold - Standing Bilateral Shoulder Internal Rotation AAROM with Dowel  - 1 x daily - 6 x weekly - 1 sets - 10 reps - 5 sec hold  ASSESSMENT: CLINICAL IMPRESSION:  10/08/2023: He has done very well with his shoulder post op. He has met PT goals and feels ready to discharge from PT to independent program.    Eval: Patient referred to PT s/p reverse total shoulder arthroplasty biceps tenodesis 08/12/23. Patient will benefit from skilled PT to address below impairments, limitations and improve overall function.  OBJECTIVE IMPAIRMENTS: decreased activity tolerance, decreased shoulder mobility, decreased ROM, decreased strength, impaired flexibility, impaired UE use, and  pain.  ACTIVITY LIMITATIONS: reaching, lifting, carry,  cleaning, driving, and or occupation  PERSONAL FACTORS:DM also affecting patient's functional outcome.  REHAB POTENTIAL: Good  CLINICAL DECISION MAKING: Stable/uncomplicated  EVALUATION COMPLEXITY: Low    GOALS: Short term PT Goals Target date: 09/23/2023   Pt will be I and compliant with HEP. Baseline:  Goal status: MET 09/16/23 2. Pt will  improve shoulder PROM to [facility]  Goal status: MET 10/08/23 Long term PT goals Target date:10/21/2023  Pt will improve Lt shoulder AROM to [facility] to improve functional reaching Baseline: Goal status: MET 10/08/23 Pt will improve  Rt shoulder strength to at least 4+/5 MMT to improve functional strength Baseline: Goal status: MET 10/08/23 Pt will improve FOTO to at least 79% functional to show improved function Baseline: Goal status:10/08/23: FOTO improved to 83% and met goal.  Pt will reduce pain to overall less than 3/10 with usual activity and work activity. Baseline: Goal status: MET 11/19  PLAN: PT FREQUENCY: 1-2 times per week   PT DURATION: 6-8 weeks  PLANNED INTERVENTIONS (unless contraindicated): aquatic PT, Canalith repositioning, cryotherapy, Electrical stimulation, Iontophoresis with 4 mg/ml dexamethasome, Moist heat, traction, Ultrasound, gait training, Therapeutic exercise, balance training, neuromuscular re-education, patient/family education, prosthetic training, manual techniques, passive ROM, dry needling, taping, vasopnuematic device, vestibular, spinal manipulations, joint manipulations  PLAN FOR NEXT SESSION: DC today  Ivery Quale, PT, DPT 10/08/23 9:30 AM

## 2023-10-20 ENCOUNTER — Other Ambulatory Visit: Payer: Self-pay

## 2023-10-30 ENCOUNTER — Telehealth: Payer: Self-pay | Admitting: Orthopedic Surgery

## 2023-10-30 NOTE — Telephone Encounter (Signed)
He is a Copy.

## 2023-10-30 NOTE — Telephone Encounter (Signed)
What is his work?

## 2023-10-30 NOTE — Telephone Encounter (Signed)
Patient is requesting RTW note, no restrictions to start 11/04/23.please call when ready , [phone]

## 2023-10-30 NOTE — Telephone Encounter (Signed)
Ok for note he should keep f/u appt and try not to lift more than 25 lbs with affected arm but this is not a restriction thx

## 2023-10-31 ENCOUNTER — Other Ambulatory Visit: Payer: Self-pay

## 2023-10-31 ENCOUNTER — Telehealth: Payer: Self-pay | Admitting: Orthopedic Surgery

## 2023-10-31 NOTE — Telephone Encounter (Signed)
Called and relayed all information to patient. Created note and left up front.

## 2023-11-10 ENCOUNTER — Ambulatory Visit (INDEPENDENT_AMBULATORY_CARE_PROVIDER_SITE_OTHER): Payer: BC Managed Care – PPO | Admitting: Orthopedic Surgery

## 2023-11-10 DIAGNOSIS — M19012 Primary osteoarthritis, left shoulder: Secondary | ICD-10-CM

## 2023-11-11 ENCOUNTER — Encounter: Payer: Self-pay | Admitting: Orthopedic Surgery

## 2023-11-11 NOTE — Progress Notes (Signed)
   Post-Op Visit Note   Patient: Timothy Warren           Date of Birth: 1961/04/29           MRN: [id number] Visit Date: 11/10/2023 PCP: Brien Belvie BRAVO, MD   Assessment & Plan:  Chief Complaint:  Chief Complaint  Patient presents with   Left Shoulder - Routine Post Op     left reverse shoulder arthroplasty on 08/12/2023     Visit Diagnoses:  1. Primary osteoarthritis, left shoulder     Plan: Timothy Warren is a 63 year old patient who underwent left reverse shoulder replacement 08/12/2023.  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  He is planning on returning to work tomorrow.  On examination his range of motion passively is 45/95/160.  Has good strength as well particularly the subscap resistance testing as well as forward flexion.  Overall the note is done well following his reverse shoulder replacement.  He will follow-up with us  as needed.  Follow-Up Instructions: No follow-ups on file.   Orders:  No orders of the defined types were placed in this encounter.  No orders of the defined types were placed in this encounter.   Imaging: No results found.  PMFS History: Patient Active Problem List   Diagnosis Date Noted   Biceps tendonitis, left 09/02/2023   Arthritis of left shoulder region 09/02/2023   OA (osteoarthritis) of shoulder 08/12/2023   S/P reverse total shoulder arthroplasty, left 08/12/2023   Onychomycosis 07/30/2022   Chronic gout without tophus 07/30/2022   Primary osteoarthritis, left shoulder 11/13/2021   Chronic right shoulder pain 10/16/2021   Hyperlipidemia due to type 2 diabetes mellitus (HCC) 05/28/2021   Type 2 diabetes mellitus without complication, without long-term current use of insulin  (HCC) 08/12/2020   Stage 3a chronic kidney disease (HCC) 08/12/2020   Erectile dysfunction associated with type 2 diabetes mellitus (HCC) 08/12/2020   Hypertension 02/17/2020   Past Medical History:  Diagnosis Date   Allergy     Chronic kidney disease    stage 2 CKD   Diabetes mellitus without complication (HCC)    Hyperlipidemia    Hypertension    Seasonal allergies     Family History  Problem Relation Age of Onset   Hypertension Mother    Hypertension Father    Colon cancer Neg Hx    Colon polyps Neg Hx    Esophageal cancer Neg Hx    Rectal cancer Neg Hx    Stomach cancer Neg Hx     Past Surgical History:  Procedure Laterality Date   BICEPT TENODESIS Left 08/12/2023   Procedure: BICEPS TENODESIS;  Surgeon: Addie Cordella Hamilton, MD;  [facility];  Service: Orthopedics;  Laterality: Left;   COLONOSCOPY     REVERSE SHOULDER ARTHROPLASTY Left 08/12/2023   Procedure: REVERSE SHOULDER ARTHROPLASTY;  Surgeon: Addie Cordella Hamilton, MD;  Location: [facility];  Service: Orthopedics;  Laterality: Left;   Social History   Occupational History   Not on file  Tobacco Use   Smoking status: Never   Smokeless tobacco: Never  Vaping Use   Vaping status: Never Used  Substance and Sexual Activity   Alcohol use: Yes    Comment: maybe a drink once a month   Drug use: Yes    Types: Marijuana    Comment: Daily   Sexual activity: Yes

## 2023-11-14 ENCOUNTER — Ambulatory Visit: Payer: BC Managed Care – PPO | Admitting: Internal Medicine

## 2023-11-24 ENCOUNTER — Other Ambulatory Visit: Payer: Self-pay

## 2023-11-26 DIAGNOSIS — N182 Chronic kidney disease, stage 2 (mild): Secondary | ICD-10-CM | POA: Diagnosis not present

## 2023-12-05 ENCOUNTER — Ambulatory Visit: Payer: BC Managed Care – PPO | Attending: Internal Medicine | Admitting: Internal Medicine

## 2023-12-05 ENCOUNTER — Encounter: Payer: Self-pay | Admitting: Internal Medicine

## 2023-12-05 VITALS — BP 113/68 | HR 61 | Temp 98.3°F | Ht 69.0 in | Wt 162.0 lb

## 2023-12-05 DIAGNOSIS — N1832 Chronic kidney disease, stage 3b: Secondary | ICD-10-CM

## 2023-12-05 DIAGNOSIS — N521 Erectile dysfunction due to diseases classified elsewhere: Secondary | ICD-10-CM

## 2023-12-05 DIAGNOSIS — E785 Hyperlipidemia, unspecified: Secondary | ICD-10-CM

## 2023-12-05 DIAGNOSIS — I152 Hypertension secondary to endocrine disorders: Secondary | ICD-10-CM | POA: Diagnosis not present

## 2023-12-05 DIAGNOSIS — Z23 Encounter for immunization: Secondary | ICD-10-CM | POA: Diagnosis not present

## 2023-12-05 DIAGNOSIS — Z5986 Financial insecurity: Secondary | ICD-10-CM

## 2023-12-05 DIAGNOSIS — Z7984 Long term (current) use of oral hypoglycemic drugs: Secondary | ICD-10-CM

## 2023-12-05 DIAGNOSIS — E119 Type 2 diabetes mellitus without complications: Secondary | ICD-10-CM

## 2023-12-05 DIAGNOSIS — E1159 Type 2 diabetes mellitus with other circulatory complications: Secondary | ICD-10-CM | POA: Diagnosis not present

## 2023-12-05 DIAGNOSIS — E1169 Type 2 diabetes mellitus with other specified complication: Secondary | ICD-10-CM

## 2023-12-05 LAB — POCT GLYCOSYLATED HEMOGLOBIN (HGB A1C): HbA1c, POC (controlled diabetic range): 6.7 % (ref 0.0–7.0)

## 2023-12-05 LAB — GLUCOSE, POCT (MANUAL RESULT ENTRY): POC Glucose: 104 mg/dL — AB (ref 70–99)

## 2023-12-05 NOTE — Progress Notes (Signed)
Patient ID: Timothy Warren, male    DOB: 01-11-1961  MRN: [id number]  CC: Establish Care (Est care - DM / HTN. /No questions / concerns/Yes to flu 7 shingles vax)   Subjective: Timothy Warren is a 63 y.o. male who presents for chronic ds management.  Previous PCP was Dr. Delford Field who has retired. His concerns today include:  Patient with history of DM type II, HTN, ED, HL, CKD 3, gout  DM: Results for orders placed or performed in visit on 12/05/23  POCT glucose (manual entry)   Collection Time: 12/05/23 11:01 AM  Result Value Ref Range   POC Glucose 104 (A) 70 - 99 mg/dl  POCT glycosylated hemoglobin (Hb A1C)   Collection Time: 12/05/23 11:06 AM  Result Value Ref Range   Hemoglobin A1C     HbA1c POC (<> result, manual entry)     HbA1c, POC (prediabetic range)     HbA1c, POC (controlled diabetic range) 6.7 0.0 - 7.0 %  Checks BS before BF.  Range in 120s Taking Metformin 500 mg BID Does well with eating habits.  Drinks juice occasionally.  Eats a lot of fresh fruits and veggies.  Eating out less. Does a lot of walking at work. 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  Also walks his dog daily for 30 mins. Works out 5 days a wk Due for DM eye exam; had done at [facility] in the past. HTN:  compliant with Norvasc 10 mg and hydrochlorothiazide 25 mg daily.  Limits salt in food. No CP/CP/LE edema HL: taking the Lipitor 20 mg daily.  Would like to get off all meds eventually including his cholesterol med.   CKD 3b: Most recent GFR 4 months ago was 36. has f/u appt with Dr. Louie Bun next mth Not on NSAIDS.  ED: Doing okay on Viagra.  HM: yes to flu and shingrix Patient Active Problem List   Diagnosis Date Noted   Biceps tendonitis, left 09/02/2023   Arthritis of left shoulder region 09/02/2023   OA (osteoarthritis) of shoulder 08/12/2023   S/P reverse total shoulder arthroplasty, left 08/12/2023   Onychomycosis 07/30/2022   Chronic gout without tophus  07/30/2022   Primary osteoarthritis, left shoulder 11/13/2021   Chronic right shoulder pain 10/16/2021   Hyperlipidemia due to type 2 diabetes mellitus (HCC) 05/28/2021   Type 2 diabetes mellitus without complication, without long-term current use of insulin (HCC) 08/12/2020   Stage 3a chronic kidney disease (HCC) 08/12/2020   Erectile dysfunction associated with type 2 diabetes mellitus (HCC) 08/12/2020   Hypertension 02/17/2020     Current Outpatient Medications on File Prior to Visit  Medication Sig Dispense Refill   acetaminophen (TYLENOL) 500 MG tablet Take 500-1,000 mg by mouth every 6 (six) hours as needed (pain.).     Blood Glucose Monitoring Suppl (ONETOUCH VERIO) w/Device KIT Use to check blood sugar twice a day 1 kit 0   glucose blood (ONETOUCH VERIO) test strip Use to check blood sugar twice daily. 100 each 2   hydrochlorothiazide (HYDRODIURIL) 25 MG tablet Take 1 tablet (25 mg total) by mouth daily. 90 tablet 3   metFORMIN (GLUCOPHAGE) 500 MG tablet Take 1 tablet (500 mg total) by mouth 2 (two) times daily with a meal. 180 tablet 3   OneTouch Delica Lancets 33G MISC Check blood sugar twice a day 100 each 3   sildenafil (VIAGRA) 100 MG tablet Take 1 tablet (100 mg total) by mouth daily as needed for erectile  dysfunction. 20 tablet 2   amLODipine (NORVASC) 10 MG tablet Take 1 tablet (10 mg total) by mouth daily. To lower blood pressure (Patient not taking: Reported on 12/05/2023) 90 tablet 2   aspirin EC 81 MG tablet Take 1 tablet (81 mg total) by mouth daily. Swallow whole. (Patient not taking: Reported on 12/05/2023) 30 tablet 0   atorvastatin (LIPITOR) 20 MG tablet Take 1 tablet (20 mg total) by mouth daily. (Patient not taking: Reported on 12/05/2023) 90 tablet 3   celecoxib (CELEBREX) 100 MG capsule Take 1 capsule (100 mg total) by mouth 2 (two) times daily. (Patient not taking: Reported on 12/05/2023) 60 capsule 0   docusate sodium (COLACE) 100 MG capsule Take 1 capsule (100 mg  total) by mouth 2 (two) times daily. (Patient not taking: Reported on 12/05/2023) 10 capsule 0   methocarbamol (ROBAXIN) 500 MG tablet Take 1 tablet (500 mg total) by mouth every 8 (eight) hours as needed for muscle spasms. (Patient not taking: Reported on 12/05/2023) 30 tablet 0   No current facility-administered medications on file prior to visit.    Allergies  Allergen Reactions   Chlorhexidine Rash    Social History   Socioeconomic History   Marital status: Significant Other    Spouse name: Not on file   Number of children: Not on file   Years of education: Not on file   Highest education level: Not on file  Occupational History   Not on file  Tobacco Use   Smoking status: Never   Smokeless tobacco: Never  Vaping Use   Vaping status: Never Used  Substance and Sexual Activity   Alcohol use: Yes    Comment: maybe a drink once a month   Drug use: Yes    Types: Marijuana    Comment: Daily   Sexual activity: Yes  Other Topics Concern   Not on file  Social History Narrative   Not on file   Social Drivers of Health   Financial Resource Strain: Medium Risk (12/05/2023)   Overall Financial Resource Strain (CARDIA)    Difficulty of Paying Living Expenses: Somewhat hard  Food Insecurity: No Food Insecurity (12/05/2023)   Hunger Vital Sign    Worried About Running Out of Food in the Last Year: Never true    Ran Out of Food in the Last Year: Never true  Transportation Needs: No Transportation Needs (12/05/2023)   PRAPARE - Administrator, Civil Service (Medical): No    Lack of Transportation (Non-Medical): No  Physical Activity: Insufficiently Active (12/05/2023)   Exercise Vital Sign    Days of Exercise per Week: 3 days    Minutes of Exercise per Session: 30 min  Stress: No Stress Concern Present (12/05/2023)   [employer] of Occupational Health - Occupational Stress Questionnaire    Feeling of Stress : Not at all  Social Connections: Socially Isolated  (12/05/2023)   Social Connection and Isolation Panel [NHANES]    Frequency of Communication with Friends and Family: Once a week    Frequency of Social Gatherings with Friends and Family: Once a week    Attends Religious Services: Never    Database administrator or Organizations: No    Attends Banker Meetings: Never    Marital Status: Never married  Intimate Partner Violence: Not At Risk (12/05/2023)   Humiliation, Afraid, Rape, and Kick questionnaire    Fear of Current or Ex-Partner: No    Emotionally Abused: No  Physically Abused: No    Sexually Abused: No    Family History  Problem Relation Age of Onset   Hypertension Mother    Hypertension Father    Colon cancer Neg Hx    Colon polyps Neg Hx    Esophageal cancer Neg Hx    Rectal cancer Neg Hx    Stomach cancer Neg Hx     Past Surgical History:  Procedure Laterality Date   BICEPT TENODESIS Left 08/12/2023   Procedure: BICEPS TENODESIS;  Surgeon: Cammy Copa, MD;  Location: [facility] OR;  Service: Orthopedics;  Laterality: Left;   COLONOSCOPY     REVERSE SHOULDER ARTHROPLASTY Left 08/12/2023   Procedure: REVERSE SHOULDER ARTHROPLASTY;  Surgeon: Cammy Copa, MD;  Location[facility] OR;  Service: Orthopedics;  Laterality: Left;    ROS: Review of Systems Negative except as stated above  PHYSICAL EXAM: BP 113/68 (BP Location: Left Arm, Patient Position: Sitting, Cuff Size: Normal)   Pulse 61   Temp 98.3 F (36.8 C) (Oral)   Ht 5\' 9"  (1.753 m)   Wt 162 lb (73.5 kg)   SpO2 100%   BMI 23.92 kg/m   Physical Exam  General appearance - alert, well appearing, older African-American male and in no distress Mental status - normal mood, behavior, speech, dress, motor activity, and thought processes Mouth - mucous membranes moist, pharynx normal without lesions Neck - supple, no significant adenopathy Chest - clear to auscultation, no wheezes, rales or rhonchi, symmetric air entry Heart - normal rate,  regular rhythm, normal S1, S2, no murmurs, rubs, clicks or gallops Extremities - peripheral pulses normal, no pedal edema, no clubbing or cyanosis      Latest Ref Rng & Units 07/31/2023    1:30 PM 06/13/2023    5:06 PM 12/10/2022    3:47 PM  CMP  Glucose 70 - 99 mg/dL 010  94  99   BUN 8 - 23 mg/dL 30  22  30    Creatinine 0.61 - 1.24 mg/dL 2.72  5.36  6.44   Sodium 135 - 145 mmol/L 135  142  140   Potassium 3.5 - 5.1 mmol/L 3.3  3.5  3.8   Chloride 98 - 111 mmol/L 95  103  98   CO2 22 - 32 mmol/L 25  22  25    Calcium 8.9 - 10.3 mg/dL 9.1  9.6  9.9   Total Protein 6.0 - 8.5 g/dL  6.7    Total Bilirubin 0.0 - 1.2 mg/dL  <0.3    Alkaline Phos 44 - 121 IU/L  60    AST 0 - 40 IU/L  24    ALT 0 - 44 IU/L  15     Lipid Panel     Component Value Date/Time   CHOL 131 06/13/2023 1706   TRIG 68 06/13/2023 1706   HDL 65 06/13/2023 1706   CHOLHDL 2.0 06/13/2023 1706   LDLCALC 52 06/13/2023 1706    CBC    Component Value Date/Time   WBC 7.9 07/31/2023 1330   RBC 4.70 07/31/2023 1330   HGB 13.0 07/31/2023 1330   HGB 12.0 (L) 06/13/2023 1706   HCT 40.0 07/31/2023 1330   HCT 36.8 (L) 06/13/2023 1706   PLT 256 07/31/2023 1330   PLT 253 06/13/2023 1706   MCV 85.1 07/31/2023 1330   MCV 85 06/13/2023 1706   MCH 27.7 07/31/2023 1330   MCHC 32.5 07/31/2023 1330   RDW 14.9 07/31/2023 1330   RDW 13.6 06/13/2023  1706   LYMPHSABS 1.8 06/13/2023 1706   EOSABS 0.5 (H) 06/13/2023 1706   BASOSABS 0.1 06/13/2023 1706    ASSESSMENT AND PLAN: 1. Type 2 diabetes mellitus with other specified complication, without long-term current use of insulin (HCC) (Primary) At goal.  Patient on metformin 500 mg twice a day.  We will check chemistry today to see where his GFR lies.  If further decrease, we may need to stop metformin and use a different medication.  Patient advised of this. - POCT glycosylated hemoglobin (Hb A1C) - POCT glucose (manual entry) - Ambulatory referral to Ophthalmology  2.  Diabetes mellitus treated with oral medication (HCC)   3. Hyperlipidemia due to type 2 diabetes mellitus (HCC) Continue atorvastatin 20 mg daily.  Discussed with the patient why it is recommended that all diabetics be on statin therapy.  4. Hypertension associated with type 2 diabetes mellitus (HCC) At goal.  Continue amlodipine 10 mg daily and HCTZ 25 mg daily.  5. Stage 3b chronic kidney disease (HCC) Not on NSAIDs.  Plugged into care with nephrology.  See #1 above for discussion about metformin. - Basic Metabolic Panel  6. Erectile dysfunction associated with type 2 diabetes mellitus (HCC) On Viagra.  7. Encounter for immunization  - Varicella-zoster vaccine IM - Flu vaccine trivalent PF, 6mos and older(Flulaval,Afluria,Fluarix,Fluzone)  Patient was given the opportunity to ask questions.  Patient verbalized understanding of the plan and was able to repeat key elements of the plan.   This documentation was completed using Paediatric nurse.  Any transcriptional errors are unintentional.  Orders Placed This Encounter  Procedures   Varicella-zoster vaccine IM   Flu vaccine trivalent PF, 6mos and older(Flulaval,Afluria,Fluarix,Fluzone)   Basic Metabolic Panel   Ambulatory referral to Ophthalmology   POCT glycosylated hemoglobin (Hb A1C)   POCT glucose (manual entry)     Requested Prescriptions    No prescriptions requested or ordered in this encounter    Return in about 4 months (around 04/03/2024).  Jonah Blue, MD, FACP

## 2023-12-06 ENCOUNTER — Encounter: Payer: Self-pay | Admitting: Internal Medicine

## 2023-12-06 LAB — BASIC METABOLIC PANEL
BUN/Creatinine Ratio: 16 (ref 10–24)
BUN: 25 mg/dL (ref 8–27)
CO2: 24 mmol/L (ref 20–29)
Calcium: 9.5 mg/dL (ref 8.6–10.2)
Chloride: 97 mmol/L (ref 96–106)
Creatinine, Ser: 1.58 mg/dL — ABNORMAL HIGH (ref 0.76–1.27)
Glucose: 90 mg/dL (ref 70–99)
Potassium: 4.1 mmol/L (ref 3.5–5.2)
Sodium: 137 mmol/L (ref 134–144)
eGFR: 49 mL/min/{1.73_m2} — ABNORMAL LOW (ref >59)

## 2023-12-06 NOTE — Progress Notes (Signed)
Let patient know that his kidney function is not at 100% but it has improved compared to when last checked in September of last year.

## 2023-12-09 DIAGNOSIS — N182 Chronic kidney disease, stage 2 (mild): Secondary | ICD-10-CM | POA: Diagnosis not present

## 2023-12-09 DIAGNOSIS — E1122 Type 2 diabetes mellitus with diabetic chronic kidney disease: Secondary | ICD-10-CM | POA: Diagnosis not present

## 2023-12-09 DIAGNOSIS — I129 Hypertensive chronic kidney disease with stage 1 through stage 4 chronic kidney disease, or unspecified chronic kidney disease: Secondary | ICD-10-CM | POA: Diagnosis not present

## 2023-12-09 DIAGNOSIS — E785 Hyperlipidemia, unspecified: Secondary | ICD-10-CM | POA: Diagnosis not present

## 2024-01-20 ENCOUNTER — Other Ambulatory Visit: Payer: Self-pay | Admitting: Critical Care Medicine

## 2024-01-20 ENCOUNTER — Other Ambulatory Visit: Payer: Self-pay

## 2024-01-21 ENCOUNTER — Other Ambulatory Visit: Payer: Self-pay

## 2024-01-21 MED ORDER — SILDENAFIL CITRATE 100 MG PO TABS
100.0000 mg | ORAL_TABLET | Freq: Every day | ORAL | 2 refills | Status: DC | PRN
Start: 2024-01-21 — End: 2024-02-16
  Filled 2024-01-21: qty 20, 20d supply, fill #0

## 2024-01-29 ENCOUNTER — Other Ambulatory Visit: Payer: Self-pay

## 2024-02-16 ENCOUNTER — Other Ambulatory Visit: Payer: Self-pay | Admitting: Internal Medicine

## 2024-02-16 NOTE — Telephone Encounter (Signed)
 Copied from CRM [id number]. Topic: Clinical - Medication Refill >> Feb 16, 2024  9:38 AM Juluis Ok wrote: Most Recent Primary Care Visit:  Provider: Concetta Dee B  Department[facility]  Visit Type: OFFICE VISIT  Date: 12/05/2023  Medication: sildenafil (VIAGRA) 100 MG tablet  Has the patient contacted their pharmacy? Yes (Agent: If no, request that the patient contact the pharmacy for the refill. If patient does not wish to contact the pharmacy document the reason why and proceed with request.) (Agent: If yes, when and what did the pharmacy advise?)  Is this the correct pharmacy for this prescription? Yes If no, delete pharmacy and type the correct one.  This is the patient's preferred pharmacy:  [facility] - [facility] [address] [place] [address] Phone: [phone] Fax: [phone]   Has the prescription been filled recently? No  Is the patient out of the medication? Yes  Has the patient been seen for an appointment in the last year OR does the patient have an upcoming appointment? Yes  Can we respond through MyChart? No  Agent: Please be advised that Rx refills may take up to 3 business days. We ask that you follow-up with your pharmacy.

## 2024-02-17 ENCOUNTER — Other Ambulatory Visit: Payer: Self-pay

## 2024-02-17 MED ORDER — SILDENAFIL CITRATE 100 MG PO TABS
100.0000 mg | ORAL_TABLET | Freq: Every day | ORAL | 1 refills | Status: DC | PRN
Start: 2024-02-17 — End: 2024-04-30
  Filled 2024-02-17: qty 20, 20d supply, fill #0

## 2024-02-17 NOTE — Telephone Encounter (Signed)
 Requested by patient . Refills remain . Future visit in 1 month.  Requested Prescriptions  Pending Prescriptions Disp Refills   sildenafil (VIAGRA) 100 MG tablet 20 tablet 1    Sig: Take 1 tablet (100 mg total) by mouth daily as needed for erectile dysfunction.     Urology: Erectile Dysfunction Agents Passed - 02/17/2024 12:27 PM      Passed - AST in normal range and within 360 days    AST  Date Value Ref Range Status  06/13/2023 24 0 - 40 IU/L Final         Passed - ALT in normal range and within 360 days    ALT  Date Value Ref Range Status  06/13/2023 15 0 - 44 IU/L Final         Passed - Last BP in normal range    BP Readings from Last 1 Encounters:  12/05/23 113/68         Passed - Valid encounter within last 12 months    Recent Outpatient Visits           2 months ago Type 2 diabetes mellitus with other specified complication, without long-term current use of insulin (HCC)   [facility] Dept Of [place]. [facility] Concetta Dee B, MD   8 months ago Type 2 diabetes mellitus without complication, without long-term current use of insulin (HCC)   [facility] - A Dept Of [place]. [facility] Vernell Goldsmith, MD   1 year ago Type 2 diabetes mellitus without complication, without long-term current use of insulin (HCC)   [facility] - A Dept Of [place]. [facility] Vernell Goldsmith, MD   1 year ago Type 2 diabetes mellitus without complication, without long-term current use of insulin (HCC)   [place] [facility] - A Dept Of [place]. [facility] Vernell Goldsmith, MD   1 year ago Type 2 diabetes mellitus without complication, without long-term current use of insulin (HCC)   [place] [facility] - A Dept Of [place]. [facility], [place], [place]       Future Appointments             In 1 month Lincoln Renshaw, Rexine Cater, MD [facility]  [facility] [place] - A Dept Of [place]. [facility]

## 2024-02-17 NOTE — Telephone Encounter (Signed)
 Rx refilled 01/21/2024 #20 2 rf  - same pharmacy. Pt should have refills available. Need to call pharmacy.

## 2024-02-20 ENCOUNTER — Other Ambulatory Visit: Payer: Self-pay

## 2024-02-27 ENCOUNTER — Other Ambulatory Visit: Payer: Self-pay

## 2024-02-27 ENCOUNTER — Other Ambulatory Visit: Payer: Self-pay | Admitting: Internal Medicine

## 2024-02-27 MED ORDER — ONETOUCH VERIO VI STRP
ORAL_STRIP | 1 refills | Status: AC
  Filled 2024-02-27: qty 200, 30d supply, fill #0
  Filled 2024-09-06: qty 100, 50d supply, fill #1
  Filled 2024-11-05: qty 100, 50d supply, fill #2

## 2024-02-27 NOTE — Telephone Encounter (Signed)
 Requested Prescriptions  Pending Prescriptions Disp Refills   glucose blood (ONETOUCH VERIO) test strip 100 each 2    Sig: Use to check blood sugar twice daily.     Endocrinology: Diabetes - Testing Supplies Passed - 02/27/2024  4:33 PM      Passed - Valid encounter within last 12 months    Recent Outpatient Visits           2 months ago Type 2 diabetes mellitus with other specified complication, without long-term current use of insulin  (HCC)   [facility] - A Dept Of [place]. [facility] Concetta Dee B, MD   8 months ago Type 2 diabetes mellitus without complication, without long-term current use of insulin  [facility])   [place] [facility] - A Dept Of [place]. [facility] Vernell Goldsmith, MD   1 year ago Type 2 diabetes mellitus without complication, without long-term current use of insulin  [facility])   [place] [facility] - A Dept Of [place]. [facility] Vernell Goldsmith, MD   1 year ago Type 2 diabetes mellitus without complication, without long-term current use of insulin  [facility])   [place] [facility] - A Dept Of [place]. [facility] Vernell Goldsmith, MD   1 year ago Type 2 diabetes mellitus without complication, without long-term current use of insulin  [facility])   [place] [facility] - A Dept Of . [facility] [place], [place], [place]       Future Appointments             In 2 months Lincoln Renshaw, Rexine Cater, MD [facility] - A Dept Of [place]. [facility]

## 2024-02-27 NOTE — Telephone Encounter (Signed)
 Copied from CRM [id number]. Topic: Clinical - Medication Refill >> Feb 27, 2024 12:26 PM Lizabeth Riggs wrote: Most Recent Primary Care Visit:  Provider: Concetta Dee B  [facility]  Visit Type: OFFICE VISIT  Date: 12/05/2023  Medication: glucose blood (ONETOUCH VERIO) test strip  Has the patient contacted their pharmacy? Yes (Agent: If no, request that the patient contact the pharmacy for the refill. If patient does not wish to contact the pharmacy document the reason why and proceed with request.) (Agent: If yes, when and what did the pharmacy advise?) Pharmacy needs order to refill  Is this the correct pharmacy for this prescription? Yes If no, delete pharmacy and type the correct one.  This is the patient's preferred pharmacy:  [facility] - [facility] [address] [place] [address] Phone: [phone] Fax: [phone]   Has the prescription been filled recently? No  Is the patient out of the medication? Yes He ran out today.  Has the patient been seen for an appointment in the last year OR does the patient have an upcoming appointment? Yes  Can we respond through MyChart? No  Agent: Please be advised that Rx refills may take up to 3 business days. We ask that you follow-up with your pharmacy.

## 2024-03-03 ENCOUNTER — Other Ambulatory Visit: Payer: Self-pay

## 2024-03-25 ENCOUNTER — Ambulatory Visit: Payer: BC Managed Care – PPO | Admitting: Internal Medicine

## 2024-03-25 DIAGNOSIS — H11153 Pinguecula, bilateral: Secondary | ICD-10-CM | POA: Diagnosis not present

## 2024-03-25 DIAGNOSIS — E119 Type 2 diabetes mellitus without complications: Secondary | ICD-10-CM | POA: Diagnosis not present

## 2024-03-25 LAB — HM DIABETES EYE EXAM

## 2024-04-23 ENCOUNTER — Other Ambulatory Visit: Payer: Self-pay | Admitting: Internal Medicine

## 2024-04-23 ENCOUNTER — Other Ambulatory Visit: Payer: Self-pay

## 2024-04-23 DIAGNOSIS — N189 Chronic kidney disease, unspecified: Secondary | ICD-10-CM

## 2024-04-23 DIAGNOSIS — I10 Essential (primary) hypertension: Secondary | ICD-10-CM

## 2024-04-23 MED ORDER — AMLODIPINE BESYLATE 10 MG PO TABS
10.0000 mg | ORAL_TABLET | Freq: Every day | ORAL | 0 refills | Status: DC
  Filled 2024-04-23: qty 30, 30d supply, fill #0

## 2024-04-27 ENCOUNTER — Other Ambulatory Visit: Payer: Self-pay

## 2024-04-30 ENCOUNTER — Other Ambulatory Visit: Payer: Self-pay

## 2024-04-30 ENCOUNTER — Ambulatory Visit: Attending: Internal Medicine | Admitting: Internal Medicine

## 2024-04-30 ENCOUNTER — Encounter: Payer: Self-pay | Admitting: Internal Medicine

## 2024-04-30 VITALS — BP 119/68 | HR 57 | Temp 98.3°F | Ht 69.0 in | Wt 159.0 lb

## 2024-04-30 DIAGNOSIS — E785 Hyperlipidemia, unspecified: Secondary | ICD-10-CM

## 2024-04-30 DIAGNOSIS — E1169 Type 2 diabetes mellitus with other specified complication: Secondary | ICD-10-CM | POA: Diagnosis not present

## 2024-04-30 DIAGNOSIS — Z7984 Long term (current) use of oral hypoglycemic drugs: Secondary | ICD-10-CM

## 2024-04-30 DIAGNOSIS — I152 Hypertension secondary to endocrine disorders: Secondary | ICD-10-CM

## 2024-04-30 DIAGNOSIS — N521 Erectile dysfunction due to diseases classified elsewhere: Secondary | ICD-10-CM

## 2024-04-30 DIAGNOSIS — Z125 Encounter for screening for malignant neoplasm of prostate: Secondary | ICD-10-CM | POA: Diagnosis not present

## 2024-04-30 DIAGNOSIS — I10 Essential (primary) hypertension: Secondary | ICD-10-CM

## 2024-04-30 DIAGNOSIS — E119 Type 2 diabetes mellitus without complications: Secondary | ICD-10-CM

## 2024-04-30 DIAGNOSIS — E1159 Type 2 diabetes mellitus with other circulatory complications: Secondary | ICD-10-CM | POA: Diagnosis not present

## 2024-04-30 DIAGNOSIS — N1831 Chronic kidney disease, stage 3a: Secondary | ICD-10-CM

## 2024-04-30 LAB — POCT GLYCOSYLATED HEMOGLOBIN (HGB A1C): HbA1c, POC (controlled diabetic range): 6.4 % (ref 0.0–7.0)

## 2024-04-30 LAB — GLUCOSE, POCT (MANUAL RESULT ENTRY): POC Glucose: 106 mg/dL — AB (ref 70–99)

## 2024-04-30 MED ORDER — METFORMIN HCL 500 MG PO TABS
500.0000 mg | ORAL_TABLET | Freq: Two times a day (BID) | ORAL | 1 refills | Status: DC
  Filled 2024-04-30: qty 180, 90d supply, fill #0

## 2024-04-30 MED ORDER — AMLODIPINE BESYLATE 10 MG PO TABS
10.0000 mg | ORAL_TABLET | Freq: Every day | ORAL | 1 refills | Status: DC
  Filled 2024-04-30 – 2024-05-28 (×2): qty 90, 90d supply, fill #0
  Filled 2024-09-06: qty 90, 90d supply, fill #1

## 2024-04-30 MED ORDER — SILDENAFIL CITRATE 100 MG PO TABS
100.0000 mg | ORAL_TABLET | Freq: Every day | ORAL | 3 refills | Status: DC | PRN
  Filled 2024-04-30: qty 20, 20d supply, fill #0
  Filled 2024-07-30: qty 20, 20d supply, fill #1
  Filled 2024-10-22: qty 20, 20d supply, fill #2

## 2024-04-30 MED ORDER — ATORVASTATIN CALCIUM 20 MG PO TABS
20.0000 mg | ORAL_TABLET | Freq: Every day | ORAL | 1 refills | Status: AC
  Filled 2024-04-30 – 2024-07-30 (×2): qty 90, 90d supply, fill #0
  Filled 2024-11-05: qty 90, 90d supply, fill #1

## 2024-04-30 MED ORDER — HYDROCHLOROTHIAZIDE 25 MG PO TABS
25.0000 mg | ORAL_TABLET | Freq: Every day | ORAL | 3 refills | Status: AC
  Filled 2024-04-30 – 2024-07-30 (×2): qty 90, 90d supply, fill #0
  Filled 2024-11-05: qty 90, 90d supply, fill #1

## 2024-04-30 NOTE — Patient Instructions (Signed)
 VISIT SUMMARY:  You had a follow-up visit to review your diabetes, hypertension, and hyperlipidemia management. Your diabetes is well-controlled with an A1c of 6.4%, and your blood pressure and cholesterol levels are stable with your current medications. You also discussed prostate cancer screening and general health maintenance.  YOUR PLAN:  -TYPE 2 DIABETES MELLITUS: Your diabetes is well-controlled with an A1c of 6.4%. Continue taking metformin  500 mg twice daily, maintain your diet and exercise routine, and monitor your blood glucose levels regularly.  -HYPERTENSION: Your blood pressure is well-controlled with your current medications. Continue taking amlodipine  and hydrochlorothiazide , and keep limiting your salt intake.  -HYPERLIPIDEMIA: Your cholesterol levels are managed with atorvastatin  20 mg daily. Continue taking your medication, and a cholesterol panel will be ordered to monitor your levels. Liver function tests will also be monitored.  -CHRONIC KIDNEY DISEASE: Your kidney function is stable. Continue with your current management and follow up with your nephrologist as scheduled.  -PROSTATE CANCER SCREENING: You are due for a PSA test to screen for prostate cancer. This test will be ordered for you.  -GENERAL HEALTH MAINTENANCE: You inquired about measles immunity. Since you had a natural infection in the past, you have lifelong immunity to measles.  INSTRUCTIONS:  Please continue with your current medications and lifestyle modifications. Monitor your blood glucose levels regularly. A cholesterol panel and PSA test have been ordered. Follow up with your nephrologist as scheduled.

## 2024-04-30 NOTE — Progress Notes (Signed)
 Patient ID: Timothy Warren, male    DOB: 1961-09-16  MRN: [id number]  CC: Diabetes (DM f/u. Layvonne PSA blood work Thompson MMR vax )   Subjective: Timothy Warren is a 63 y.o. male who presents for chronic ds management. His concerns today include:  Patient with history of DM type II, HTN, ED, HL, CKD 3, gout   Discussed the use of AI scribe software for clinical note transcription with the patient, who gave verbal consent to proceed.  History of Present Illness Timothy Warren is a 64 year old male with diabetes, hypertension, and hyperlipidemia who presents for a six-month follow-up visit.  DM: Results for orders placed or performed in visit on 04/30/24  POCT glucose (manual entry)   Collection Time: 04/30/24  3:56 PM  Result Value Ref Range   POC Glucose 106 (A) 70 - 99 mg/dl  POCT glycosylated hemoglobin (Hb A1C)   Collection Time: 04/30/24  4:14 PM  Result Value Ref Range   Hemoglobin A1C     HbA1c POC (<> result, manual entry)     HbA1c, POC (prediabetic range)     HbA1c, POC (controlled diabetic range) 6.4 0.0 - 7.0 %  He manages his diabetes with metformin  500 mg twice daily. His A1c has improved from 6.7% to 6.4%. He is more active, engaging in weight lifting and walking frequently at work, and is mindful of his diet, avoiding sweets and starches. His weight is stable at 159 pounds. Morning blood sugar readings range from 98 to 115 mg/dL, with a highest reading of 140 mg/dL.  For hypertension, he takes amlodipine  and hydrochlorothiazide . He avoids salt in cooking and only consumes it when eating out. He experiences no chest pain or shortness of breath.  CKD: GFR 6 months ago was 49.  It had improved from 36.  Last saw his nephrologist sometime earlier this year.  He is not on NSAIDs.  He continues atorvastatin  20 mg daily for hyperlipidemia and is due for a cholesterol recheck.  Request refill on Viagra /sildenafil .  Does well with it.  He inquired about prostate  cancer screening and will have a PSA test done. He has received both doses of the shingles vaccine.  A recent eye exam showed no significant findings, though he uses over-the-counter reading glasses.    Patient Active Problem List   Diagnosis Date Noted   Biceps tendonitis, left 09/02/2023   Arthritis of left shoulder region 09/02/2023   OA (osteoarthritis) of shoulder 08/12/2023   S/P reverse total shoulder arthroplasty, left 08/12/2023   Onychomycosis 07/30/2022   Chronic gout without tophus 07/30/2022   Primary osteoarthritis, left shoulder 11/13/2021   Chronic right shoulder pain 10/16/2021   Hyperlipidemia due to type 2 diabetes mellitus (HCC) 05/28/2021   Type 2 diabetes mellitus without complication, without long-term current use of insulin  (HCC) 08/12/2020   Stage 3a chronic kidney disease (HCC) 08/12/2020   Erectile dysfunction associated with type 2 diabetes mellitus (HCC) 08/12/2020   Hypertension 02/17/2020     Current Outpatient Medications on File Prior to Visit  Medication Sig Dispense Refill   acetaminophen  (TYLENOL ) 500 MG tablet Take 500-1,000 mg by mouth every 6 (six) hours as needed (pain.).     Blood Glucose Monitoring Suppl (ONETOUCH VERIO) w/Device KIT Use to check blood sugar twice a day 1 kit 0   glucose blood (ONETOUCH VERIO) test strip Use to check blood sugar twice daily. 200 each 1   OneTouch Delica Lancets 33G MISC Check blood  sugar twice a day 100 each 3   aspirin  EC 81 MG tablet Take 1 tablet (81 mg total) by mouth daily. Swallow whole. (Patient not taking: Reported on 04/30/2024) 30 tablet 0   celecoxib  (CELEBREX ) 100 MG capsule Take 1 capsule (100 mg total) by mouth 2 (two) times daily. (Patient not taking: Reported on 04/30/2024) 60 capsule 0   docusate sodium  (COLACE) 100 MG capsule Take 1 capsule (100 mg total) by mouth 2 (two) times daily. (Patient not taking: Reported on 04/30/2024) 10 capsule 0   No current facility-administered medications on file  prior to visit.    Allergies  Allergen Reactions   Chlorhexidine  Rash    Social History   Socioeconomic History   Marital status: Significant Other    Spouse name: Not on file   Number of children: Not on file   Years of education: Not on file   Highest education level: Not on file  Occupational History   Not on file  Tobacco Use   Smoking status: Never   Smokeless tobacco: Never  Vaping Use   Vaping status: Never Used  Substance and Sexual Activity   Alcohol use: Yes    Comment: maybe a drink once a month   Drug use: Yes    Types: Marijuana    Comment: Daily   Sexual activity: Yes  Other Topics Concern   Not on file  Social History Narrative   Not on file   Social Drivers of Health   Financial Resource Strain: Medium Risk (12/05/2023)   Overall Financial Resource Strain (CARDIA)    Difficulty of Paying Living Expenses: Somewhat hard  Food Insecurity: No Food Insecurity (12/05/2023)   Hunger Vital Sign    Worried About Running Out of Food in the Last Year: Never true    Ran Out of Food in the Last Year: Never true  Transportation Needs: No Transportation Needs (12/05/2023)   PRAPARE - Administrator, Civil Service (Medical): No    Lack of Transportation (Non-Medical): No  Physical Activity: Insufficiently Active (12/05/2023)   Exercise Vital Sign    Days of Exercise per Week: 3 days    Minutes of Exercise per Session: 30 min  Stress: No Stress Concern Present (12/05/2023)   [employer] of Occupational Health - Occupational Stress Questionnaire    Feeling of Stress : Not at all  Social Connections: Socially Isolated (12/05/2023)   Social Connection and Isolation Panel    Frequency of Communication with Friends and Family: Once a week    Frequency of Social Gatherings with Friends and Family: Once a week    Attends Religious Services: Never    Database administrator or Organizations: No    Attends Banker Meetings: Never    Marital  Status: Never married  Intimate Partner Violence: Not At Risk (12/05/2023)   Humiliation, Afraid, Rape, and Kick questionnaire    Fear of Current or Ex-Partner: No    Emotionally Abused: No    Physically Abused: No    Sexually Abused: No    Family History  Problem Relation Age of Onset   Hypertension Mother    Hypertension Father    Colon cancer Neg Hx    Colon polyps Neg Hx    Esophageal cancer Neg Hx    Rectal cancer Neg Hx    Stomach cancer Neg Hx     Past Surgical History:  Procedure Laterality Date   BICEPT TENODESIS Left 08/12/2023   Procedure: BICEPS  TENODESIS;  Surgeon: Addie Cordella Hamilton, MD;  Location: [facility] OR;  Service: Orthopedics;  Laterality: Left;   COLONOSCOPY     REVERSE SHOULDER ARTHROPLASTY Left 08/12/2023   Procedure: REVERSE SHOULDER ARTHROPLASTY;  Surgeon: Addie Cordella Hamilton, MD;  Location: [facility] OR;  Service: Orthopedics;  Laterality: Left;    ROS: Review of Systems Negative except as stated above  PHYSICAL EXAM: BP 119/68 (BP Location: Left Arm, Patient Position: Sitting, Cuff Size: Normal)   Pulse (!) 57   Temp 98.3 F (36.8 C) (Oral)   Ht 5' 9 (1.753 m)   Wt 159 lb (72.1 kg)   SpO2 100%   BMI 23.48 kg/m   Wt Readings from Last 3 Encounters:  04/30/24 159 lb (72.1 kg)  12/05/23 162 lb (73.5 kg)  08/12/23 160 lb (72.6 kg)    Physical Exam   General appearance - alert, well appearing, and in no distress Mental status - normal mood, behavior, speech, dress, motor activity, and thought processes Chest - clear to auscultation, no wheezes, rales or rhonchi, symmetric air entry Heart - normal rate, regular rhythm, normal S1, S2, no murmurs, rubs, clicks or gallops Extremities - peripheral pulses normal, no pedal edema, no clubbing or cyanosis Diabetic Foot Exam - Simple   Simple Foot Form Diabetic Foot exam was performed with the following findings: Yes 04/30/2024  5:45 PM  Visual Inspection See comments: Yes Sensation Testing Intact to touch  and monofilament testing bilaterally: Yes Pulse Check Posterior Tibialis and Dorsalis pulse intact bilaterally: Yes Comments Shaved calluses on dorsal surface of both second toes.  Nonulcerative callus on the medial aspect of both big toes.  Bony overgrowth deformity laterally on both midfoot        Latest Ref Rng & Units 12/05/2023   12:23 PM 07/31/2023    1:30 PM 06/13/2023    5:06 PM  CMP  Glucose 70 - 99 mg/dL 90  833  94   BUN 8 - 27 mg/dL 25  30  22    Creatinine 0.76 - 1.27 mg/dL 8.41  7.93  8.29   Sodium 134 - 144 mmol/L 137  135  142   Potassium 3.5 - 5.2 mmol/L 4.1  3.3  3.5   Chloride 96 - 106 mmol/L 97  95  103   CO2 20 - 29 mmol/L 24  25  22    Calcium  8.6 - 10.2 mg/dL 9.5  9.1  9.6   Total Protein 6.0 - 8.5 g/dL   6.7   Total Bilirubin 0.0 - 1.2 mg/dL   <9.7   Alkaline Phos 44 - 121 IU/L   60   AST 0 - 40 IU/L   24   ALT 0 - 44 IU/L   15    Lipid Panel     Component Value Date/Time   CHOL 131 06/13/2023 1706   TRIG 68 06/13/2023 1706   HDL 65 06/13/2023 1706   CHOLHDL 2.0 06/13/2023 1706   LDLCALC 52 06/13/2023 1706    CBC    Component Value Date/Time   WBC 7.9 07/31/2023 1330   RBC 4.70 07/31/2023 1330   HGB 13.0 07/31/2023 1330   HGB 12.0 (L) 06/13/2023 1706   HCT 40.0 07/31/2023 1330   HCT 36.8 (L) 06/13/2023 1706   PLT 256 07/31/2023 1330   PLT 253 06/13/2023 1706   MCV 85.1 07/31/2023 1330   MCV 85 06/13/2023 1706   MCH 27.7 07/31/2023 1330   MCHC 32.5 07/31/2023 1330   RDW 14.9 07/31/2023  1330   RDW 13.6 06/13/2023 1706   LYMPHSABS 1.8 06/13/2023 1706   EOSABS 0.5 (H) 06/13/2023 1706   BASOSABS 0.1 06/13/2023 1706    ASSESSMENT AND PLAN: 1. Type 2 diabetes mellitus with other specified complication, without long-term current use of insulin  (HCC) (Primary) Diabetes type 2 treated with oral medication At goal.  Continue metformin  500 mg twice a day.  Continue healthy eating habits and staying active. - POCT glycosylated hemoglobin (Hb  A1C) - POCT glucose (manual entry)  2. Hypertension associated with diabetes (HCC) At goal.  Continue Norvasc  10 mg daily and HCTZ 25 mg daily - amLODipine  (NORVASC ) 10 MG tablet; Take 1 tablet (10 mg total) by mouth daily. To lower blood pressure  Dispense: 90 tablet; Refill: 1 - Comprehensive metabolic panel with GFR - hydrochlorothiazide  (HYDRODIURIL ) 25 MG tablet; Take 1 tablet (25 mg total) by mouth daily.  Dispense: 90 tablet; Refill: 3  3. Stage 3a chronic kidney disease (HCC) We will continue to monitor kidney function. - amLODipine  (NORVASC ) 10 MG tablet; Take 1 tablet (10 mg total) by mouth daily. To lower blood pressure  Dispense: 90 tablet; Refill: 1  4. Hyperlipidemia due to type 2 diabetes mellitus (HCC) Continue atorvastatin  - atorvastatin  (LIPITOR) 20 MG tablet; Take 1 tablet (20 mg total) by mouth daily.  Dispense: 90 tablet; Refill: 1 - Lipid panel  5. Erectile dysfunction associated with type 2 diabetes mellitus (HCC) Refill given on Viagra  - sildenafil  (VIAGRA ) 100 MG tablet; Take 1 tablet (100 mg total) by mouth daily as needed for erectile dysfunction.  Dispense: 20 tablet; Refill: 3  6. Prostate cancer screening - PSA   Patient was given the opportunity to ask questions.  Patient verbalized understanding of the plan and was able to repeat key elements of the plan.   This documentation was completed using Paediatric nurse.  Any transcriptional errors are unintentional.  Orders Placed This Encounter  Procedures   PSA   Lipid panel   Comprehensive metabolic panel with GFR   POCT glycosylated hemoglobin (Hb A1C)   POCT glucose (manual entry)     Requested Prescriptions   Signed Prescriptions Disp Refills   amLODipine  (NORVASC ) 10 MG tablet 90 tablet 1    Sig: Take 1 tablet (10 mg total) by mouth daily. To lower blood pressure   metFORMIN  (GLUCOPHAGE ) 500 MG tablet 180 tablet 1    Sig: Take 1 tablet (500 mg total) by mouth 2 (two)  times daily with a meal.   atorvastatin  (LIPITOR) 20 MG tablet 90 tablet 1    Sig: Take 1 tablet (20 mg total) by mouth daily.   hydrochlorothiazide  (HYDRODIURIL ) 25 MG tablet 90 tablet 3    Sig: Take 1 tablet (25 mg total) by mouth daily.   sildenafil  (VIAGRA ) 100 MG tablet 20 tablet 3    Sig: Take 1 tablet (100 mg total) by mouth daily as needed for erectile dysfunction.    Return in about 5 months (around 09/30/2024).  Barnie Louder, MD, FACP

## 2024-05-01 LAB — COMPREHENSIVE METABOLIC PANEL WITH GFR
ALT: 20 IU/L (ref 0–44)
AST: 23 IU/L (ref 0–40)
Albumin: 4.5 g/dL (ref 3.9–4.9)
Alkaline Phosphatase: 62 IU/L (ref 44–121)
BUN/Creatinine Ratio: 15 (ref 10–24)
BUN: 29 mg/dL — ABNORMAL HIGH (ref 8–27)
Bilirubin Total: 0.2 mg/dL (ref 0.0–1.2)
CO2: 24 mmol/L (ref 20–29)
Calcium: 9.7 mg/dL (ref 8.6–10.2)
Chloride: 95 mmol/L — ABNORMAL LOW (ref 96–106)
Creatinine, Ser: 1.94 mg/dL — ABNORMAL HIGH (ref 0.76–1.27)
Globulin, Total: 2.4 g/dL (ref 1.5–4.5)
Glucose: 97 mg/dL (ref 70–99)
Potassium: 3.7 mmol/L (ref 3.5–5.2)
Sodium: 137 mmol/L (ref 134–144)
Total Protein: 6.9 g/dL (ref 6.0–8.5)
eGFR: 39 mL/min/{1.73_m2} — ABNORMAL LOW (ref >59)

## 2024-05-01 LAB — PSA: Prostate Specific Ag, Serum: 0.8 ng/mL (ref 0.0–4.0)

## 2024-05-01 LAB — LIPID PANEL
Chol/HDL Ratio: 2.3 ratio (ref 0.0–5.0)
Cholesterol, Total: 128 mg/dL (ref 100–199)
HDL: 56 mg/dL (ref >39)
LDL Chol Calc (NIH): 47 mg/dL (ref 0–99)
Triglycerides: 151 mg/dL — ABNORMAL HIGH (ref 0–149)
VLDL Cholesterol Cal: 25 mg/dL (ref 5–40)

## 2024-05-02 ENCOUNTER — Ambulatory Visit: Payer: Self-pay | Admitting: Internal Medicine

## 2024-05-02 NOTE — Progress Notes (Signed)
 Let pt know that kidney function has worsened some compared to when it was checked in 11/2023. I recommend decreasing Metformin  from 500 mg twice a day to just once a day. I recommend adding another diabetes medication instead called Jardiance 10 mg daily. Bernadine will cause more frequent urination so be sure to stay hydrated by drinking 4-8 glasses of water  daily. Will send prescription to his pharmacy.  Let me know once you have spoken with pt so that I can send the new prescription. -cholesterol levels are good. -Liver function tests normal. -PSA, screening test for prostate cancer is normal.

## 2024-05-04 ENCOUNTER — Other Ambulatory Visit: Payer: Self-pay

## 2024-05-04 NOTE — Telephone Encounter (Signed)
 Copied from CRM [id number]. Topic: Clinical - Lab/Test Results >> May 04, 2024 11:57 AM Sophia H wrote:  Reason for CRM: Patient called back in to advise that he will go ahead and move forward with the medication regimen that Dr. Vicci wants him to follow, please send in prescriptions to  [facility]. If any questions please call # [phone]

## 2024-05-05 ENCOUNTER — Other Ambulatory Visit: Payer: Self-pay

## 2024-05-05 ENCOUNTER — Other Ambulatory Visit: Payer: Self-pay | Admitting: Internal Medicine

## 2024-05-05 ENCOUNTER — Telehealth: Payer: Self-pay | Admitting: Pharmacist

## 2024-05-05 MED ORDER — EMPAGLIFLOZIN 10 MG PO TABS
10.0000 mg | ORAL_TABLET | Freq: Every day | ORAL | 1 refills | Status: DC
  Filled 2024-05-05: qty 30, 30d supply, fill #0

## 2024-05-05 MED ORDER — GLIMEPIRIDE 2 MG PO TABS
2.0000 mg | ORAL_TABLET | Freq: Every day | ORAL | 3 refills | Status: DC
  Filled 2024-05-05: qty 30, 30d supply, fill #0

## 2024-05-05 MED ORDER — METFORMIN HCL 500 MG PO TABS
500.0000 mg | ORAL_TABLET | Freq: Every day | ORAL | 1 refills | Status: DC
  Filled 2024-05-05: qty 90, 90d supply, fill #0

## 2024-05-05 NOTE — Telephone Encounter (Signed)
 Hey friends,   I am sorry to saw that Jardiance copay, even with a copay card, is ~$160. Same with the [place]. Unfortunately, he has BCBS with a $3000 copay. Therefore, the copay will be high until he meets his deductible.

## 2024-05-05 NOTE — Telephone Encounter (Signed)
 Ok. Will change to Amaryl 2 mg daily. Clarisa please let pt know that due to increase cost of the new medication Jardiance, I have changed to a different less expensive one call Amaryl instead.

## 2024-05-05 NOTE — Addendum Note (Signed)
 Addended by: VICCI SOBER B on: 05/05/2024 11:55 AM   Modules accepted: Orders

## 2024-05-06 NOTE — Telephone Encounter (Signed)
 Called but no answer. LVM to call back.

## 2024-05-10 ENCOUNTER — Other Ambulatory Visit: Payer: Self-pay

## 2024-05-11 NOTE — Telephone Encounter (Signed)
 Called & spoke to the patient. Verified name & DOB. Informed that Dr.Johnson will  change to Amaryl  2 mg daily due to increase cost of the new medication Jardiance , it's been changed to a different less expensive one call Amaryl  instead. Patient confirmed that he has already picked up Amaryl  from the pharmacy. No further questions / concerns.

## 2024-05-18 ENCOUNTER — Ambulatory Visit: Payer: Self-pay

## 2024-05-18 NOTE — Telephone Encounter (Signed)
 This RN made first attempt to triage patient. No answer, LVM. Routing for additional attempts.   Copied from CRM [id number]. Topic: Clinical - Medication Question >> May 18, 2024  9:29 AM Timothy Warren wrote: Reason for CRM: patient called requested to be taken off either metFORMIN  (GLUCOPHAGE ) 500 MG tablet or glimepiride  (AMARYL ) 2 MG tablet. Patient stated he get up in the mornings and check his sugar before breakfast and it is in the 60's and 70's. He feel like that is too low. He says he workout every evening and does not need the medication

## 2024-05-18 NOTE — Telephone Encounter (Signed)
 FYI Only or Action Required?: FYI only for provider.  Patient was last seen in primary care on 04/30/2024 by [name], MD.  Called Nurse Triage reporting No chief complaint on file..  Symptoms began today.  Interventions attempted: Nothing.  Symptoms are: stable.  Triage Disposition: No disposition on file.  Patient/caregiver understands and will follow disposition?: Answer Assessment - Initial Assessment Questions 1. REASON FOR CALL: What is the main reason for your call? or How can I best help you?  Patient calling as he noted his blood sugar levels in the morning have been lower. Patient denies any current s/s of hypoglycemia  Yesterday: 60's,  Sunday: 70's.   This nurse reviewed how the patient is taking his medication:  Patient confirmed he was taking metformin  TWICE daily. ( Morning and evening) Patient confirmed he was taking glimepiride  (AMARYL ) 2 MG tablet ONCE daily (Morning).    Per chart review, prescription is noted Metformin  ONCE daily. ( Morning) Patient confirmed he was taking glimepiride  (AMARYL ) 2 MG tablet ONCE daily (Morning).   Patient realized he was taking an extra dose in the evening. Patient was educated appropriately and instructed to continue to monitor his blood sugar level. Also informed to call back if his blood sugar levels were less than normal baseline. Patient verbalized understanding.  Protocols used: Information Only Call - No Triage-A-AH

## 2024-05-19 NOTE — Telephone Encounter (Signed)
 Phone call returned to patient today.  I left message on his cell phone informing of who I am and that I saw he had called and spoken with the nurse yesterday.  Advised to call back if he has any additional questions or concerns.

## 2024-05-19 NOTE — Telephone Encounter (Signed)
 Copied from CRM [id number]. Topic: General - Other >> May 19, 2024  1:15 PM Tiffany S wrote:  Reason for CRM: Patient was returning Dr Vicci call please follow up with patient

## 2024-05-19 NOTE — Telephone Encounter (Signed)
 FYI

## 2024-05-20 NOTE — Addendum Note (Signed)
 Addended by: VICCI SOBER B on: 05/20/2024 09:51 PM   Modules accepted: Orders

## 2024-05-20 NOTE — Telephone Encounter (Signed)
 Phone call returned to patient today.  Inquired from patient how he was doing on the diabetes medicine metformin  500 mg once a day and Amaryl  2 mg daily.  According to the RN's note, patient may have been taking the Amaryl  twice a day instead of once a day.  Patient states that that is not the case, he was taking metformin  in the morning and Amaryl  in the evening.  He reports that he is waking up with blood sugars in the 70s.  Patient states that he works out for at least an hour every day.  Advised patient to stop the Amaryl  and continue the metformin .  Advised to give me a call if fasting morning blood sugars starts to increase above 130.  Patient expressed understanding of the plan.

## 2024-05-20 NOTE — Telephone Encounter (Signed)
 FYI Only or Action Required?: Action required by provider: update on patient condition.  Patient was last seen in primary care on 04/30/2024 by [name], MD.  Called Nurse Triage reporting Medication Problem.  Symptoms began several days ago.  Interventions attempted: Prescription medications: started glimepiride  .  Symptoms are: stable.  Triage Disposition: Call PCP Now  Patient/caregiver understands and will follow disposition?: Yes  Pt calling back. He states that he is having lower CBGs in the mornings. Lowest has been 60. Pt denies having any sx when low and checks before breakfast and then eats and goes to work. Advised pt medication is doing it's job and as long as he isn't having SE then its ok to continue. Recommended if having CBG less than 70 to eat and then recheck to make sure it comes up but otherwise ok. Pt just concerned and didn't know if dose needed to be decreased so it aint affecting CBG so much. Pt would like CB after 4pm d/t he works in [employer] and unable to hear his phone. Advised I would send message back and have them FU with him this evening.   Copied from CRM [id number]. Topic: Clinical - Red Word Triage >> May 20, 2024  8:30 AM Elle L wrote: Red Word that prompted transfer to Nurse Triage: The patient states his blood sugar is currently 72 and has been as low as 60 due to a medication reaction from his glimepiride  (AMARYL ) 2 MG tablet.

## 2024-05-20 NOTE — Telephone Encounter (Signed)
 This encounter was created in error - please disregard.

## 2024-05-28 ENCOUNTER — Other Ambulatory Visit: Payer: Self-pay

## 2024-07-30 ENCOUNTER — Other Ambulatory Visit: Payer: Self-pay

## 2024-09-06 ENCOUNTER — Other Ambulatory Visit: Payer: Self-pay

## 2024-09-06 ENCOUNTER — Other Ambulatory Visit: Payer: Self-pay | Admitting: Internal Medicine

## 2024-09-06 MED ORDER — ONETOUCH DELICA PLUS LANCET33G MISC
6 refills | Status: AC
  Filled 2024-09-06: qty 100, 90d supply, fill #0
  Filled 2024-11-08 – 2024-11-11 (×2): qty 100, 90d supply, fill #1

## 2024-09-07 ENCOUNTER — Other Ambulatory Visit: Payer: Self-pay

## 2024-10-04 ENCOUNTER — Other Ambulatory Visit: Payer: Self-pay

## 2024-10-04 ENCOUNTER — Encounter: Payer: Self-pay | Admitting: Internal Medicine

## 2024-10-04 ENCOUNTER — Ambulatory Visit: Attending: Internal Medicine | Admitting: Internal Medicine

## 2024-10-04 VITALS — BP 104/61 | HR 71 | Temp 97.9°F | Ht 69.0 in | Wt 161.0 lb

## 2024-10-04 DIAGNOSIS — E1169 Type 2 diabetes mellitus with other specified complication: Secondary | ICD-10-CM

## 2024-10-04 DIAGNOSIS — Z23 Encounter for immunization: Secondary | ICD-10-CM | POA: Diagnosis not present

## 2024-10-04 DIAGNOSIS — I152 Hypertension secondary to endocrine disorders: Secondary | ICD-10-CM

## 2024-10-04 DIAGNOSIS — E785 Hyperlipidemia, unspecified: Secondary | ICD-10-CM | POA: Diagnosis not present

## 2024-10-04 DIAGNOSIS — Z2821 Immunization not carried out because of patient refusal: Secondary | ICD-10-CM | POA: Diagnosis not present

## 2024-10-04 DIAGNOSIS — N1832 Chronic kidney disease, stage 3b: Secondary | ICD-10-CM

## 2024-10-04 DIAGNOSIS — Z7984 Long term (current) use of oral hypoglycemic drugs: Secondary | ICD-10-CM | POA: Diagnosis not present

## 2024-10-04 DIAGNOSIS — E1159 Type 2 diabetes mellitus with other circulatory complications: Secondary | ICD-10-CM

## 2024-10-04 DIAGNOSIS — E119 Type 2 diabetes mellitus without complications: Secondary | ICD-10-CM

## 2024-10-04 LAB — POCT GLYCOSYLATED HEMOGLOBIN (HGB A1C): HbA1c, POC (controlled diabetic range): 6.3 % (ref 0.0–7.0)

## 2024-10-04 LAB — GLUCOSE, POCT (MANUAL RESULT ENTRY): POC Glucose: 123 mg/dL — AB (ref 70–99)

## 2024-10-04 MED ORDER — AMLODIPINE BESYLATE 10 MG PO TABS
10.0000 mg | ORAL_TABLET | Freq: Every day | ORAL | 1 refills | Status: AC
  Filled 2024-10-04: qty 90, 90d supply, fill #0

## 2024-10-04 NOTE — Patient Instructions (Signed)
  VISIT SUMMARY: You had a follow-up appointment to review your diabetes, hypertension, hyperlipidemia, and chronic kidney disease. Your conditions are well-managed with your current medications and lifestyle choices. We discussed your recent lab results and made plans for ongoing monitoring.  YOUR PLAN: -TYPE 2 DIABETES MELLITUS: Type 2 diabetes is a condition where your body does not use insulin  properly, leading to high blood sugar levels. Your diabetes is well-controlled with an A1c of 6.3% and blood sugar levels between 90-126 mg/dL. Continue taking metformin  500 mg once daily, avoid high-sugar juices, and maintain your healthy diet and regular exercise.  -HYPERTENSION: Hypertension, or high blood pressure, is when the force of your blood against your artery walls is too high. Your blood pressure is well-controlled with amlodipine  10 mg and hydrochlorothiazide  25 mg daily. Continue taking these medications and monitor your blood pressure regularly.  -HYPERLIPIDEMIA: Hyperlipidemia is having high levels of fats (lipids) in your blood, which can increase your risk of heart disease. Your condition is well-managed with atorvastatin  20 mg daily, and your LDL cholesterol is at a good level (47 mg/dL). Continue taking atorvastatin  20 mg daily.  -CHRONIC KIDNEY DISEASE, STAGE 3B: Chronic kidney disease (CKD) is a condition where your kidneys are damaged and can't filter blood as well as they should. You are at stage 3b, which means moderate to severe loss of kidney function. We have obtained a urine sample for protein screening and ordered blood tests to assess your kidney function.  -GENERAL HEALTH MAINTENANCE: You declined the flu shot but agreed to receive the tetanus vaccine. Continue with your current healthy lifestyle choices, including regular exercise and a balanced diet.  INSTRUCTIONS: Please follow up with the lab for your blood tests and urine protein screening. Continue monitoring your blood  pressure and blood sugar levels regularly. Schedule your next follow-up appointment in 3 months.                      Contains text generated by Abridge.                                 Contains text generated by Abridge.

## 2024-10-04 NOTE — Progress Notes (Signed)
 Patient ID: Timothy Warren, male    DOB: [birth year]  MRN: [id number]  CC: Diabetes (DM f/u. Med refill./No questions / concerns/No to flu vax. Yes to Tdap vax)   Subjective: Timothy Warren is a 63 y.o. male who presents for chronic ds management. His concerns today include:  Patient with history of DM type II, HTN, ED, HL, CKD 3, gout   Discussed the use of AI scribe software for clinical note transcription with the patient, who gave verbal consent to proceed.  History of Present Illness   Timothy Warren is a 63 year old male with diabetes, hypertension, and hyperlipidemia who presents for follow-up.  DM:  Results for orders placed or performed in visit on 10/04/24  POCT glucose (manual entry)   Collection Time: 10/04/24  1:43 PM  Result Value Ref Range   POC Glucose 123 (A) 70 - 99 mg/dl  POCT glycosylated hemoglobin (Hb A1C)   Collection Time: 10/04/24  1:48 PM  Result Value Ref Range   Hemoglobin A1C     HbA1c POC (<> result, manual entry)     HbA1c, POC (prediabetic range)     HbA1c, POC (controlled diabetic range) 6.3 0.0 - 7.0 %  His diabetes is managed with metformin  500 mg once daily, reduced from twice daily due to kidney function concerns. His recent A1c is 6.3%, and his blood sugar levels range from 90 to 126 mg/dL, which he monitors daily, sometimes twice a day. He reports focusing on protein intake and limiting starches and sugary drinks. -He had shoulder surgery on October 8th of the previous year, which initially impacted his physical activity, but he has since resumed regular exercise. He prefers drinking water  and occasionally consumes apple juice in the morning if he does not eat an apple.  For hypertension, he is on amlodipine  10 mg and hydrochlorothiazide  25 mg daily. He checks his blood pressure a few times a week and reports it as being 'real good.' He engages in regular physical activity, working out five days a week for about an hour and incorporating walking  into his daily routine at work. No chest pain, shortness of breath, leg swelling, chronic headaches, or dizziness.  Regarding hyperlipidemia, he takes atorvastatin  20 mg daily. His last LDL cholesterol was 47 mg/dL, which is within the target range for patients with diabetes.  CKD 3: last GFR was 39 in June down from 49. Since then we had decrease Metformin  from 500 mg BID to once a day. Not on NSAIDs         Patient Active Problem List   Diagnosis Date Noted   Biceps tendonitis, left 09/02/2023   Arthritis of left shoulder region 09/02/2023   OA (osteoarthritis) of shoulder 08/12/2023   S/P reverse total shoulder arthroplasty, left 08/12/2023   Onychomycosis 07/30/2022   Chronic gout without tophus 07/30/2022   Primary osteoarthritis, left shoulder 11/13/2021   Chronic right shoulder pain 10/16/2021   Hyperlipidemia due to type 2 diabetes mellitus (HCC) 05/28/2021   Type 2 diabetes mellitus without complication, without long-term current use of insulin  (HCC) 08/12/2020   Stage 3a chronic kidney disease (HCC) 08/12/2020   Erectile dysfunction associated with type 2 diabetes mellitus (HCC) 08/12/2020   Hypertension 02/17/2020     Current Outpatient Medications on File Prior to Visit  Medication Sig Dispense Refill   acetaminophen  (TYLENOL ) 500 MG tablet Take 500-1,000 mg by mouth every 6 (six) hours as needed (pain.).     amLODipine  (NORVASC ) 10  MG tablet Take 1 tablet (10 mg total) by mouth daily. To lower blood pressure 90 tablet 1   aspirin  EC 81 MG tablet Take 1 tablet (81 mg total) by mouth daily. Swallow whole. 30 tablet 0   atorvastatin  (LIPITOR) 20 MG tablet Take 1 tablet (20 mg total) by mouth daily. 90 tablet 1   Blood Glucose Monitoring Suppl (ONETOUCH VERIO) w/Device KIT Use to check blood sugar twice a day 1 kit 0   glucose blood (ONETOUCH VERIO) test strip Use to check blood sugar twice daily. 200 each 1   hydrochlorothiazide  (HYDRODIURIL ) 25 MG tablet Take 1 tablet  (25 mg total) by mouth daily. 90 tablet 3   Lancets (ONETOUCH DELICA PLUS LANCET33G) MISC Use as directed to check blood sugar daily. 100 each 6   metFORMIN  (GLUCOPHAGE ) 500 MG tablet Take 1 tablet (500 mg total) by mouth daily with breakfast. 90 tablet 1   OneTouch Delica Lancets 33G MISC Check blood sugar twice a day 100 each 3   sildenafil  (VIAGRA ) 100 MG tablet Take 1 tablet (100 mg total) by mouth daily as needed for erectile dysfunction. 20 tablet 3   celecoxib  (CELEBREX ) 100 MG capsule Take 1 capsule (100 mg total) by mouth 2 (two) times daily. (Patient not taking: Reported on 10/04/2024) 60 capsule 0   No current facility-administered medications on file prior to visit.    Allergies  Allergen Reactions   Chlorhexidine  Rash    Social History   Socioeconomic History   Marital status: Significant Other    Spouse name: Not on file   Number of children: Not on file   Years of education: Not on file   Highest education level: Not on file  Occupational History   Not on file  Tobacco Use   Smoking status: Never   Smokeless tobacco: Never  Vaping Use   Vaping status: Never Used  Substance and Sexual Activity   Alcohol use: Yes    Comment: maybe a drink once a month   Drug use: Yes    Types: Marijuana    Comment: Daily   Sexual activity: Yes  Other Topics Concern   Not on file  Social History Narrative   Not on file   Social Drivers of Health   Financial Resource Strain: Medium Risk (12/05/2023)   Overall Financial Resource Strain (CARDIA)    Difficulty of Paying Living Expenses: Somewhat hard  Food Insecurity: No Food Insecurity (12/05/2023)   Hunger Vital Sign    Worried About Running Out of Food in the Last Year: Never true    Ran Out of Food in the Last Year: Never true  Transportation Needs: No Transportation Needs (12/05/2023)   PRAPARE - Administrator, Civil Service (Medical): No    Lack of Transportation (Non-Medical): No  Physical Activity:  Insufficiently Active (12/05/2023)   Exercise Vital Sign    Days of Exercise per Week: 3 days    Minutes of Exercise per Session: 30 min  Stress: No Stress Concern Present (12/05/2023)   [employer] of Occupational Health - Occupational Stress Questionnaire    Feeling of Stress : Not at all  Social Connections: Socially Isolated (12/05/2023)   Social Connection and Isolation Panel    Frequency of Communication with Friends and Family: Once a week    Frequency of Social Gatherings with Friends and Family: Once a week    Attends Religious Services: Never    Database Administrator or Organizations: No  Attends Banker Meetings: Never    Marital Status: Never married  Intimate Partner Violence: Not At Risk (12/05/2023)   Humiliation, Afraid, Rape, and Kick questionnaire    Fear of Current or Ex-Partner: No    Emotionally Abused: No    Physically Abused: No    Sexually Abused: No    Family History  Problem Relation Age of Onset   Hypertension Mother    Hypertension Father    Colon cancer Neg Hx    Colon polyps Neg Hx    Esophageal cancer Neg Hx    Rectal cancer Neg Hx    Stomach cancer Neg Hx     Past Surgical History:  Procedure Laterality Date   BICEPT TENODESIS Left 08/12/2023   Procedure: BICEPS TENODESIS;  Surgeon: Addie Cordella Hamilton, MD;  Location: [facility] OR;  Service: Orthopedics;  Laterality: Left;   COLONOSCOPY     REVERSE SHOULDER ARTHROPLASTY Left 08/12/2023   Procedure: REVERSE SHOULDER ARTHROPLASTY;  Surgeon: Addie Cordella Hamilton, MD;  Location: [facility] OR;  Service: Orthopedics;  Laterality: Left;    ROS: Review of Systems Negative except as stated above  PHYSICAL EXAM: BP 104/61 (BP Location: Left Arm, Patient Position: Sitting, Cuff Size: Normal)   Pulse 71   Temp 97.9 F (36.6 C) (Oral)   Ht 5' 9 (1.753 m)   Wt 161 lb (73 kg)   SpO2 99%   BMI 23.78 kg/m   Wt Readings from Last 3 Encounters:  10/04/24 161 lb (73 kg)  04/30/24 159 lb (72.1  kg)  12/05/23 162 lb (73.5 kg)    Physical Exam   General appearance - alert, well appearing, and in no distress Mental status - normal mood, behavior, speech, dress, motor activity, and thought processes Neck - supple, no significant adenopathy Chest - clear to auscultation, no wheezes, rales or rhonchi, symmetric air entry Heart - normal rate, regular rhythm, normal S1, S2, no murmurs, rubs, clicks or gallops Extremities - peripheral pulses normal, no pedal edema, no clubbing or cyanosis     Latest Ref Rng & Units 04/30/2024    4:40 PM 12/05/2023   12:23 PM 07/31/2023    1:30 PM  CMP  Glucose 70 - 99 mg/dL 97  90  833   BUN 8 - 27 mg/dL 29  25  30    Creatinine 0.76 - 1.27 mg/dL 8.05  8.41  7.93   Sodium 134 - 144 mmol/L 137  137  135   Potassium 3.5 - 5.2 mmol/L 3.7  4.1  3.3   Chloride 96 - 106 mmol/L 95  97  95   CO2 20 - 29 mmol/L 24  24  25    Calcium  8.6 - 10.2 mg/dL 9.7  9.5  9.1   Total Protein 6.0 - 8.5 g/dL 6.9     Total Bilirubin 0.0 - 1.2 mg/dL 0.2     Alkaline Phos 44 - 121 IU/L 62     AST 0 - 40 IU/L 23     ALT 0 - 44 IU/L 20      Lipid Panel     Component Value Date/Time   CHOL 128 04/30/2024 1640   TRIG 151 (H) 04/30/2024 1640   HDL 56 04/30/2024 1640   CHOLHDL 2.3 04/30/2024 1640   LDLCALC 47 04/30/2024 1640    CBC    Component Value Date/Time   WBC 7.9 07/31/2023 1330   RBC 4.70 07/31/2023 1330   HGB 13.0 07/31/2023 1330   HGB 12.0 (L)  06/13/2023 1706   HCT 40.0 07/31/2023 1330   HCT 36.8 (L) 06/13/2023 1706   PLT 256 07/31/2023 1330   PLT 253 06/13/2023 1706   MCV 85.1 07/31/2023 1330   MCV 85 06/13/2023 1706   MCH 27.7 07/31/2023 1330   MCHC 32.5 07/31/2023 1330   RDW 14.9 07/31/2023 1330   RDW 13.6 06/13/2023 1706   LYMPHSABS 1.8 06/13/2023 1706   EOSABS 0.5 (H) 06/13/2023 1706   BASOSABS 0.1 06/13/2023 1706    ASSESSMENT AND PLAN: 1. Type 2 diabetes mellitus with other specified complication, without long-term current use of  insulin  (HCC) (Primary) Type 2 DM treated with oral med -at goal Continue Metformin  500 mg daily, healthy eating and regular exercise. Stay away from sugary drinks including juices - POCT glucose (manual entry) - POCT glycosylated hemoglobin (Hb A1C) - Microalbumin / creatinine urine ratio  2. Hypertension associated with diabetes (HCC) At goal. Continue hydrochlorothiazide  and Norvasc  10 mg daily - amLODipine  (NORVASC ) 10 MG tablet; Take 1 tablet (10 mg total) by mouth daily. To lower blood pressure  Dispense: 90 tablet; Refill: 1  3. Stage 3b chronic kidney disease (HCC) Since last visit, we lower the dose of Metformin  Advised to avoid NSAIDs Recheck today BMP - Basic Metabolic Panel  4. Hyperlipidemia due to type 2 diabetes mellitus (HCC) At goal. Continue Atorvastatin  20 mg daily  5. Influenza vaccination declined Recommended. Pt declined  6. Need for Tdap vaccination Given today    Patient was given the opportunity to ask questions.  Patient verbalized understanding of the plan and was able to repeat key elements of the plan.   This documentation was completed using Paediatric nurse.  Any transcriptional errors are unintentional.  Orders Placed This Encounter  Procedures   Tdap vaccine greater than or equal to 7yo IM   POCT glucose (manual entry)   POCT glycosylated hemoglobin (Hb A1C)     Requested Prescriptions   Pending Prescriptions Disp Refills   amLODipine  (NORVASC ) 10 MG tablet 90 tablet 1    Sig: Take 1 tablet (10 mg total) by mouth daily. To lower blood pressure    No follow-ups on file.  Barnie Louder, MD, FACP

## 2024-10-05 LAB — BASIC METABOLIC PANEL WITH GFR
BUN/Creatinine Ratio: 21 (ref 10–24)
BUN: 43 mg/dL — ABNORMAL HIGH (ref 8–27)
CO2: 25 mmol/L (ref 20–29)
Calcium: 9.6 mg/dL (ref 8.6–10.2)
Chloride: 95 mmol/L — ABNORMAL LOW (ref 96–106)
Creatinine, Ser: 2.08 mg/dL — ABNORMAL HIGH (ref 0.76–1.27)
Glucose: 95 mg/dL (ref 70–99)
Potassium: 4.1 mmol/L (ref 3.5–5.2)
Sodium: 134 mmol/L (ref 134–144)
eGFR: 35 mL/min/1.73 — ABNORMAL LOW (ref >59)

## 2024-10-05 LAB — MICROALBUMIN / CREATININE URINE RATIO
Creatinine, Urine: 73.7 mg/dL
Microalb/Creat Ratio: <4 mg/g{creat} (ref 0–29)
Microalbumin, Urine: <3 ug/mL

## 2024-10-06 ENCOUNTER — Ambulatory Visit: Payer: Self-pay | Admitting: Internal Medicine

## 2024-10-06 ENCOUNTER — Other Ambulatory Visit: Payer: Self-pay | Admitting: Internal Medicine

## 2024-10-06 DIAGNOSIS — N1832 Chronic kidney disease, stage 3b: Secondary | ICD-10-CM

## 2024-10-06 NOTE — Progress Notes (Signed)
 Kidney function remains poor.  I would like to refer him to a kidney specialist known as nephrology.  Referral has been submitted.   Stop Metformin . Continue to monitor BS daily sometimes before breakfast and other times before dinner. If readings start increasing above 140, let me know so that we can put him on a different med for DM.  No abnormal amounts of protein in the urine.  Make sure that he continues to drink at least 4 to 8 glasses of water  daily.

## 2024-10-21 ENCOUNTER — Other Ambulatory Visit: Payer: Self-pay | Admitting: Internal Medicine

## 2024-10-21 DIAGNOSIS — E1169 Type 2 diabetes mellitus with other specified complication: Secondary | ICD-10-CM

## 2024-10-21 NOTE — Telephone Encounter (Unsigned)
 Copied from CRM #[id number]. Topic: Clinical - Medication Refill >> Oct 21, 2024  2:42 PM Hadassah PARAS wrote: Medication: sildenafil  (VIAGRA ) 100 MG tablet   Has the patient contacted their pharmacy? No (Agent: If no, request that the patient contact the pharmacy for the refill. If patient does not wish to contact the pharmacy document the reason why and proceed with request.) (Agent: If yes, when and what did the pharmacy advise?)  This is the patient's preferred pharmacy:  [facility] [address], [address] [place] [address] Phone: [phone] Fax: [phone]  Is this the correct pharmacy for this prescription? Yes If no, delete pharmacy and type the correct one.   Has the prescription been filled recently? No  Is the patient out of the medication? Yes  Has the patient been seen for an appointment in the last year OR does the patient have an upcoming appointment? Yes  Can we respond through MyChart? No  Agent: Please be advised that Rx refills may take up to 3 business days. We ask that you follow-up with your pharmacy.

## 2024-10-22 ENCOUNTER — Other Ambulatory Visit: Payer: Self-pay

## 2024-10-25 ENCOUNTER — Other Ambulatory Visit: Payer: Self-pay

## 2024-10-25 MED FILL — Sildenafil Citrate Tab 100 MG: 100.0000 mg | ORAL | 20 days supply | Qty: 20 | Fill #0 | Status: CN

## 2024-10-25 NOTE — Telephone Encounter (Signed)
 Requested Prescriptions  Pending Prescriptions Disp Refills   sildenafil  (VIAGRA ) 100 MG tablet 20 tablet 3    Sig: Take 1 tablet (100 mg total) by mouth daily as needed for erectile dysfunction.     Urology: Erectile Dysfunction Agents Passed - 10/25/2024  4:06 PM      Passed - AST in normal range and within 360 days    AST  Date Value Ref Range Status  04/30/2024 23 0 - 40 IU/L Final         Passed - ALT in normal range and within 360 days    ALT  Date Value Ref Range Status  04/30/2024 20 0 - 44 IU/L Final         Passed - Last BP in normal range    BP Readings from Last 1 Encounters:  10/04/24 104/61         Passed - Valid encounter within last 12 months    Recent Outpatient Visits           3 weeks ago Type 2 diabetes mellitus with other specified complication, without long-term current use of insulin  (HCC)   [facility] [place] Vicci Sober B, MD   5 months ago Type 2 diabetes mellitus with other specified complication, without long-term current use of insulin  [facility])   [facility] [place]. [facility] Vicci Sober B, MD   10 months ago Type 2 diabetes mellitus with other specified complication, without long-term current use of insulin  [facility])   [facility] [place]. [facility] Vicci Sober B, MD   1 year ago Type 2 diabetes mellitus without complication, without long-term current use of insulin  [facility])   [facility] [place]. [facility] Brien Belvie BRAVO, MD   1 year ago Type 2 diabetes mellitus without complication, without long-term current use of insulin  [facility])   [facility] [place]. [facility] Brien Belvie BRAVO, MD

## 2024-11-02 ENCOUNTER — Ambulatory Visit: Payer: Self-pay

## 2024-11-02 NOTE — Telephone Encounter (Signed)
 Patient called and advised the appointment scheduled for tomorrow is not within the region of [facility], so it will need to be scheduled. He understood. I advised no availability with any provider within the region until mid January, advised to go to the mobile bus tomorrow, hours and location provided. He says he will go there tomorrow.

## 2024-11-02 NOTE — Telephone Encounter (Signed)
 FYI Only or Action Required?: FYI only for provider: appointment scheduled on 11/03/2024.  Patient was last seen in primary care on 10/04/2024 by [name], MD.  Called Nurse Triage reporting Leg Pain.  Symptoms began several days ago.  Interventions attempted: Rest, hydration, or home remedies.  Symptoms are: unchanged.  Triage Disposition: See PCP When Office is Open (Within 3 Days)  Patient/caregiver understands and will follow disposition?: Yes

## 2024-11-02 NOTE — Telephone Encounter (Signed)
" °  FYI Only or Action Required?: Action required by provider: request for appointment.  Patient was last seen in primary care on 10/04/2024 by Timothy Barnie NOVAK, MD.  Called Nurse Triage reporting Leg Pain.  Symptoms began several days ago.  Interventions attempted: Rest, hydration, or home remedies.  Symptoms are: unchanged.Bilateral leg pain, right is worse. Hurts from thigh down to calf. Some back pain. Worse at night. Asking to be worked in. Please advise pt.  Triage Disposition: See PCP When Office is Open (Within 3 Days)  Patient/caregiver understands and will follow disposition?:      Copied from CRM #[id number]. Topic: Clinical - Red Word Triage >> Nov 02, 2024  9:55 AM Zebedee SAUNDERS wrote: Red Word that prompted transfer to Nurse Triage: Pt has intense leg pain shooting from thigh to calf, started two nights ago. Reason for Disposition  [1] MODERATE pain (e.g., interferes with normal activities, limping) AND [2] present > 3 days  Answer Assessment - Initial Assessment Questions 1. ONSET: When did the pain start?      2 days 2. LOCATION: Where is the pain located?      Both legs, right is worse 3. PAIN: How bad is the pain?    (Scale 1-10; or mild, moderate, severe)     Severe - at night 4. WORK OR EXERCISE: Has there been any recent work or exercise that involved this part of the body?      no 5. CAUSE: What do you think is causing the leg pain?     unsure 6. OTHER SYMPTOMS: Do you have any other symptoms? (e.g., chest pain, back pain, breathing difficulty, swelling, rash, fever, numbness, weakness)     Low back pain 7. PREGNANCY: Is there any chance you are pregnant? When was your last menstrual period?     N/a  Protocols used: Leg Pain-A-AH  "

## 2024-11-03 ENCOUNTER — Ambulatory Visit: Admitting: Family Medicine

## 2024-11-03 NOTE — Telephone Encounter (Signed)
 Noted

## 2024-11-05 ENCOUNTER — Other Ambulatory Visit: Payer: Self-pay

## 2024-11-08 ENCOUNTER — Other Ambulatory Visit: Payer: Self-pay

## 2024-11-22 ENCOUNTER — Telehealth: Payer: Self-pay | Admitting: Internal Medicine

## 2024-11-22 NOTE — Telephone Encounter (Signed)
 Appointment scheduled for 11/23/2024 to address questions / concerns.

## 2024-11-22 NOTE — Telephone Encounter (Signed)
 Pt requesting pain meds ( for his left leg)  sch  appt

## 2024-11-23 ENCOUNTER — Other Ambulatory Visit: Payer: Self-pay

## 2024-11-23 ENCOUNTER — Ambulatory Visit: Payer: Self-pay | Attending: Internal Medicine | Admitting: Internal Medicine

## 2024-11-23 ENCOUNTER — Encounter: Payer: Self-pay | Admitting: Internal Medicine

## 2024-11-23 DIAGNOSIS — M5431 Sciatica, right side: Secondary | ICD-10-CM

## 2024-11-23 DIAGNOSIS — M5432 Sciatica, left side: Secondary | ICD-10-CM | POA: Diagnosis not present

## 2024-11-23 MED ORDER — GABAPENTIN 100 MG PO CAPS
200.0000 mg | ORAL_CAPSULE | Freq: Every day | ORAL | 1 refills | Status: AC
  Filled 2024-11-23: qty 60, 30d supply, fill #0

## 2024-11-23 NOTE — Progress Notes (Signed)
 "   Patient ID: Timothy Warren, male    DOB: [birth year]  MRN: [id number]  CC: Sciatic nerve (Sciatic nerve pain on LT leg X3 - 4 mo  )   Subjective: Timothy Warren is a 64 y.o. male who presents for UC visit His chronic medical issues include:  Patient with history of DM type II, HTN, ED, HL, CKD 3, gout   Discussed the use of AI scribe software for clinical note transcription with the patient, who gave verbal consent to proceed.  History of Present Illness Timothy Warren is a 63 year old male who presents with left leg pain that he thinks is sciatica.  He experiences pain that originates in the left hamstring area and radiates down the leg, occasionally affecting both legs. The pain can also ascend to the lower back and sometimes to the right leg. It worsens at night, particularly when lying in bed, and can occur during walking sometimes. These symptoms have been present intermittently for a couple of years, with an increase in frequency recently.  A few nights ago, he felt pain radiating down the left leg and eventually to the right leg as well.  He went to [employer] and purchased a roll-on topical treatment for nerve pain, which provided relief after application and massage. No numbness or tingling in the legs, but there is occasional numbness in the feet. He does not experience muscle spasms or tightness, and there is no discoloration in the legs or toes.  He works as a copy and sometimes attributes the pain to excessive walking or prolonged periods of lying down. The pain does not typically require him to stop walking or rest.      Patient Active Problem List   Diagnosis Date Noted   Biceps tendonitis, left 09/02/2023   Arthritis of left shoulder region 09/02/2023   OA (osteoarthritis) of shoulder 08/12/2023   S/P reverse total shoulder arthroplasty, left 08/12/2023   Onychomycosis 07/30/2022   Chronic gout without tophus 07/30/2022   Primary osteoarthritis, left shoulder 11/13/2021    Chronic right shoulder pain 10/16/2021   Hyperlipidemia due to type 2 diabetes mellitus (HCC) 05/28/2021   Type 2 diabetes mellitus without complication, without long-term current use of insulin  (HCC) 08/12/2020   Stage 3a chronic kidney disease (HCC) 08/12/2020   Erectile dysfunction associated with type 2 diabetes mellitus (HCC) 08/12/2020   Hypertension 02/17/2020     Medications Ordered Prior to Encounter[1]  Allergies[2]  Social History   Socioeconomic History   Marital status: Significant Other    Spouse name: Not on file   Number of children: Not on file   Years of education: Not on file   Highest education level: Not on file  Occupational History   Not on file  Tobacco Use   Smoking status: Never   Smokeless tobacco: Never  Vaping Use   Vaping status: Never Used  Substance and Sexual Activity   Alcohol use: Yes    Comment: maybe a drink once a month   Drug use: Yes    Types: Marijuana    Comment: Daily   Sexual activity: Yes  Other Topics Concern   Not on file  Social History Narrative   Not on file   Social Drivers of Health   Tobacco Use: Low Risk (11/23/2024)   Patient History    Smoking Tobacco Use: Never    Smokeless Tobacco Use: Never    Passive Exposure: Not on file  Financial Resource Strain: Medium Risk (12/05/2023)  Overall Financial Resource Strain (CARDIA)    Difficulty of Paying Living Expenses: Somewhat hard  Food Insecurity: No Food Insecurity (12/05/2023)   Hunger Vital Sign    Worried About Running Out of Food in the Last Year: Never true    Ran Out of Food in the Last Year: Never true  Transportation Needs: No Transportation Needs (12/05/2023)   PRAPARE - Administrator, Civil Service (Medical): No    Lack of Transportation (Non-Medical): No  Physical Activity: Insufficiently Active (12/05/2023)   Exercise Vital Sign    Days of Exercise per Week: 3 days    Minutes of Exercise per Session: 30 min  Stress: No Stress  Concern Present (12/05/2023)   [employer] of Occupational Health - Occupational Stress Questionnaire    Feeling of Stress : Not at all  Social Connections: Socially Isolated (12/05/2023)   Social Connection and Isolation Panel    Frequency of Communication with Friends and Family: Once a week    Frequency of Social Gatherings with Friends and Family: Once a week    Attends Religious Services: Never    Database Administrator or Organizations: No    Attends Banker Meetings: Never    Marital Status: Never married  Intimate Partner Violence: Not At Risk (12/05/2023)   Humiliation, Afraid, Rape, and Kick questionnaire    Fear of Current or Ex-Partner: No    Emotionally Abused: No    Physically Abused: No    Sexually Abused: No  Depression (PHQ2-9): Low Risk (10/04/2024)   Depression (PHQ2-9)    PHQ-2 Score: 0  Alcohol Screen: Low Risk (12/05/2023)   Alcohol Screen    Last Alcohol Screening Score (AUDIT): 1  Housing: Low Risk (12/05/2023)   Housing Stability Vital Sign    Unable to Pay for Housing in the Last Year: No    Number of Times Moved in the Last Year: 0    Homeless in the Last Year: No  Utilities: Not At Risk (12/05/2023)   AHC Utilities    Threatened with loss of utilities: No  Health Literacy: Adequate Health Literacy (12/05/2023)   B1300 Health Literacy    Frequency of need for help with medical instructions: Rarely    Family History  Problem Relation Age of Onset   Hypertension Mother    Hypertension Father    Colon cancer Neg Hx    Colon polyps Neg Hx    Esophageal cancer Neg Hx    Rectal cancer Neg Hx    Stomach cancer Neg Hx     Past Surgical History:  Procedure Laterality Date   BICEPT TENODESIS Left 08/12/2023   Procedure: BICEPS TENODESIS;  Surgeon: Addie Cordella Hamilton, MD;  Location: [facility];  Service: Orthopedics;  Laterality: Left;   COLONOSCOPY     REVERSE SHOULDER ARTHROPLASTY Left 08/12/2023   Procedure: REVERSE SHOULDER ARTHROPLASTY;   Surgeon: Addie Cordella Hamilton, MD;  Location: [facility] OR;  Service: Orthopedics;  Laterality: Left;    ROS: Review of Systems Negative except as stated above  PHYSICAL EXAM: BP 122/76 (BP Location: Left Arm, Patient Position: Sitting, Cuff Size: Normal)   Pulse (!) 55   Temp 98 F (36.7 C) (Oral)   Ht 5' 9 (1.753 m)   Wt 158 lb (71.7 kg)   SpO2 100%   BMI 23.33 kg/m   Physical Exam   General appearance - alert, well appearing, and in no distress Mental status - normal mood, behavior, speech, dress, motor activity, and  thought processes Neurological - Lower extremities: Power 5/5 bilaterally proximally and distally.  Gross sensation intact in both lower extremities.  Straight leg raise negative on both sides. Musculoskeletal -no tenderness on palpation of the lumbar spine or surrounding paraspinal muscles. Extremities: Legs are warm.  No cyanosis noted in the toes.  Dorsalis pedis, posterior tibialis, popliteal and femoral pulses are 3+ bilaterally.     Latest Ref Rng & Units 10/04/2024    2:27 PM 04/30/2024    4:40 PM 12/05/2023   12:23 PM  CMP  Glucose 70 - 99 mg/dL 95  97  90   BUN 8 - 27 mg/dL 43  29  25   Creatinine 0.76 - 1.27 mg/dL 7.91  8.05  8.41   Sodium 134 - 144 mmol/L 134  137  137   Potassium 3.5 - 5.2 mmol/L 4.1  3.7  4.1   Chloride 96 - 106 mmol/L 95  95  97   CO2 20 - 29 mmol/L 25  24  24    Calcium  8.6 - 10.2 mg/dL 9.6  9.7  9.5   Total Protein 6.0 - 8.5 g/dL  6.9    Total Bilirubin 0.0 - 1.2 mg/dL  0.2    Alkaline Phos 44 - 121 IU/L  62    AST 0 - 40 IU/L  23    ALT 0 - 44 IU/L  20     Lipid Panel     Component Value Date/Time   CHOL 128 04/30/2024 1640   TRIG 151 (H) 04/30/2024 1640   HDL 56 04/30/2024 1640   CHOLHDL 2.3 04/30/2024 1640   LDLCALC 47 04/30/2024 1640    CBC    Component Value Date/Time   WBC 7.9 07/31/2023 1330   RBC 4.70 07/31/2023 1330   HGB 13.0 07/31/2023 1330   HGB 12.0 (L) 06/13/2023 1706   HCT 40.0 07/31/2023 1330   HCT  36.8 (L) 06/13/2023 1706   PLT 256 07/31/2023 1330   PLT 253 06/13/2023 1706   MCV 85.1 07/31/2023 1330   MCV 85 06/13/2023 1706   MCH 27.7 07/31/2023 1330   MCHC 32.5 07/31/2023 1330   RDW 14.9 07/31/2023 1330   RDW 13.6 06/13/2023 1706   LYMPHSABS 1.8 06/13/2023 1706   EOSABS 0.5 (H) 06/13/2023 1706   BASOSABS 0.1 06/13/2023 1706    ASSESSMENT AND PLAN: 1. Bilateral sciatica Symptoms not totally classic for sciatica but between a history and physical exam, I suspect this is neurologic and not vascular.  I recommend giving a trial of low-dose gabapentin  200 mg to take at bedtime.  He can take it every day at bedtime or only during flareups for 1 week.  Advised that the medicine can cause drowsiness.  We will reassess on subsequent visit. - gabapentin  (NEURONTIN ) 100 MG capsule; Take 2 capsules (200 mg total) by mouth at bedtime.  Dispense: 60 capsule; Refill: 1    Patient was given the opportunity to ask questions.  Patient verbalized understanding of the plan and was able to repeat key elements of the plan.   This documentation was completed using Paediatric nurse.  Any transcriptional errors are unintentional.  No orders of the defined types were placed in this encounter.    Requested Prescriptions   Signed Prescriptions Disp Refills   gabapentin  (NEURONTIN ) 100 MG capsule 60 capsule 1    Sig: Take 2 capsules (200 mg total) by mouth at bedtime.    No follow-ups on file.  Barnie Louder, MD,  FACP     [1]  Current Outpatient Medications on File Prior to Visit  Medication Sig Dispense Refill   acetaminophen  (TYLENOL ) 500 MG tablet Take 500-1,000 mg by mouth every 6 (six) hours as needed (pain.).     amLODipine  (NORVASC ) 10 MG tablet Take 1 tablet (10 mg total) by mouth daily. To lower blood pressure 90 tablet 1   aspirin  EC 81 MG tablet Take 1 tablet (81 mg total) by mouth daily. Swallow whole. 30 tablet 0   atorvastatin  (LIPITOR) 20 MG tablet Take  1 tablet (20 mg total) by mouth daily. 90 tablet 1   Blood Glucose Monitoring Suppl (ONETOUCH VERIO) w/Device KIT Use to check blood sugar twice a day 1 kit 0   glucose blood (ONETOUCH VERIO) test strip Use to check blood sugar twice daily. 200 each 1   hydrochlorothiazide  (HYDRODIURIL ) 25 MG tablet Take 1 tablet (25 mg total) by mouth daily. 90 tablet 3   Lancets (ONETOUCH DELICA PLUS LANCET33G) MISC Use as directed to check blood sugar daily. 100 each 6   OneTouch Delica Lancets 33G MISC Check blood sugar twice a day 100 each 3   sildenafil  (VIAGRA ) 100 MG tablet Take 1 tablet (100 mg total) by mouth daily as needed for erectile dysfunction. 20 tablet 3   No current facility-administered medications on file prior to visit.  [2]  Allergies Allergen Reactions   Chlorhexidine  Rash   "

## 2024-11-23 NOTE — Patient Instructions (Signed)
" °  VISIT SUMMARY: During your visit, we discussed your left leg pain, which is suspected to be sciatica. You described the pain as originating in the left hamstring area and radiating down the leg, sometimes affecting both legs and ascending to the lower back. The pain worsens at night and during walking. You have been using a roll-on topical treatment for relief.  YOUR PLAN: -SCIATICA: Sciatica is a condition where pain radiates along the path of the sciatic nerve, which runs from your lower back through your hips and down each leg. Your symptoms include shooting pain from the hip down the leg, with occasional numbness and tingling in the feet. We have prescribed gabapentin  200 mg to be taken at bedtime for nerve pain management. You can take gabapentin  daily at bedtime or as needed during flare-ups for up to a week. The prescription has been sent to the pharmacy downstairs.  INSTRUCTIONS: Please follow up with us  if your symptoms do not improve or if you experience any side effects from the medication.    Contains text generated by [employer].   "

## 2025-02-03 ENCOUNTER — Ambulatory Visit: Admitting: Internal Medicine
